# Patient Record
Sex: Female | Born: 1950 | Race: Black or African American | Hispanic: No | Marital: Single | Smoking: Former smoker
Health system: Southern US, Community
[De-identification: ages and names within clinical notes are randomized; demographics above are authoritative.]

## PROBLEM LIST (undated history)

## (undated) DIAGNOSIS — M109 Gout, unspecified: Secondary | ICD-10-CM

## (undated) DIAGNOSIS — G4730 Sleep apnea, unspecified: Secondary | ICD-10-CM

## (undated) DIAGNOSIS — A419 Sepsis, unspecified organism: Secondary | ICD-10-CM

## (undated) DIAGNOSIS — K219 Gastro-esophageal reflux disease without esophagitis: Secondary | ICD-10-CM

## (undated) DIAGNOSIS — E119 Type 2 diabetes mellitus without complications: Secondary | ICD-10-CM

## (undated) DIAGNOSIS — E785 Hyperlipidemia, unspecified: Secondary | ICD-10-CM

## (undated) DIAGNOSIS — N390 Urinary tract infection, site not specified: Secondary | ICD-10-CM

## (undated) DIAGNOSIS — I10 Essential (primary) hypertension: Secondary | ICD-10-CM

## (undated) DIAGNOSIS — I2510 Atherosclerotic heart disease of native coronary artery without angina pectoris: Secondary | ICD-10-CM

## (undated) HISTORY — PX: CARDIAC CATHETERIZATION: SHX172

## (undated) HISTORY — PX: REPLACEMENT TOTAL KNEE: SUR1224

## (undated) HISTORY — PX: CORONARY STENT PLACEMENT: SHX1402

## (undated) HISTORY — PX: CORONARY ANGIOPLASTY: SHX604

---

## 2017-12-28 ENCOUNTER — Encounter (HOSPITAL_COMMUNITY): Payer: Self-pay | Admitting: Oncology

## 2017-12-28 ENCOUNTER — Emergency Department (HOSPITAL_COMMUNITY)
Admission: EM | Admit: 2017-12-28 | Discharge: 2017-12-29 | Disposition: A | Discharge status: Home / Self Care | Payer: Medicare Other | Attending: Emergency Medicine | Admitting: Emergency Medicine

## 2017-12-28 DIAGNOSIS — I10 Essential (primary) hypertension: Secondary | ICD-10-CM | POA: Insufficient documentation

## 2017-12-28 DIAGNOSIS — N390 Urinary tract infection, site not specified: Secondary | ICD-10-CM | POA: Insufficient documentation

## 2017-12-28 DIAGNOSIS — R531 Weakness: Secondary | ICD-10-CM | POA: Diagnosis present

## 2017-12-28 DIAGNOSIS — Z955 Presence of coronary angioplasty implant and graft: Secondary | ICD-10-CM | POA: Insufficient documentation

## 2017-12-28 DIAGNOSIS — Z87891 Personal history of nicotine dependence: Secondary | ICD-10-CM | POA: Diagnosis not present

## 2017-12-28 DIAGNOSIS — E119 Type 2 diabetes mellitus without complications: Secondary | ICD-10-CM | POA: Insufficient documentation

## 2017-12-28 DIAGNOSIS — I2510 Atherosclerotic heart disease of native coronary artery without angina pectoris: Secondary | ICD-10-CM | POA: Diagnosis not present

## 2017-12-28 HISTORY — DX: Gout, unspecified: M10.9

## 2017-12-28 HISTORY — DX: Hyperlipidemia, unspecified: E78.5

## 2017-12-28 HISTORY — DX: Essential (primary) hypertension: I10

## 2017-12-28 HISTORY — DX: Gastro-esophageal reflux disease without esophagitis: K21.9

## 2017-12-28 HISTORY — DX: Type 2 diabetes mellitus without complications: E11.9

## 2017-12-28 HISTORY — DX: Atherosclerotic heart disease of native coronary artery without angina pectoris: I25.10

## 2017-12-28 LAB — COMPREHENSIVE METABOLIC PANEL
ALBUMIN: 3 g/dL — AB (ref 3.5–5.0)
ALT: 10 U/L (ref 0–44)
ANION GAP: 7 (ref 5–15)
AST: 16 U/L (ref 15–41)
Alkaline Phosphatase: 71 U/L (ref 38–126)
BUN: 7 mg/dL — AB (ref 8–23)
CHLORIDE: 103 mmol/L (ref 98–111)
CO2: 28 mmol/L (ref 22–32)
Calcium: 9.2 mg/dL (ref 8.9–10.3)
Creatinine, Ser: 0.91 mg/dL (ref 0.44–1.00)
GFR calc Af Amer: >60 mL/min (ref >60)
GFR calc non Af Amer: >60 mL/min (ref >60)
GLUCOSE: 153 mg/dL — AB (ref 70–99)
POTASSIUM: 3.5 mmol/L (ref 3.5–5.1)
SODIUM: 138 mmol/L (ref 135–145)
Total Bilirubin: 0.6 mg/dL (ref 0.3–1.2)
Total Protein: 6.3 g/dL — ABNORMAL LOW (ref 6.5–8.1)

## 2017-12-28 LAB — URINALYSIS, ROUTINE W REFLEX MICROSCOPIC
BILIRUBIN URINE: NEGATIVE
GLUCOSE, UA: NEGATIVE mg/dL
KETONES UR: NEGATIVE mg/dL
NITRITE: NEGATIVE
PH: 6 (ref 5.0–8.0)
Protein, ur: NEGATIVE mg/dL
Specific Gravity, Urine: 1.009 (ref 1.005–1.030)

## 2017-12-28 LAB — CBC WITH DIFFERENTIAL/PLATELET
Abs Immature Granulocytes: 0 10*3/uL (ref 0.0–0.1)
BASOS ABS: 0.1 10*3/uL (ref 0.0–0.1)
Basophils Relative: 1 %
EOS ABS: 0.1 10*3/uL (ref 0.0–0.7)
EOS PCT: 2 %
HEMATOCRIT: 41.7 % (ref 36.0–46.0)
Hemoglobin: 13 g/dL (ref 12.0–15.0)
IMMATURE GRANULOCYTES: 1 %
LYMPHS ABS: 2.3 10*3/uL (ref 0.7–4.0)
LYMPHS PCT: 40 %
MCH: 26.9 pg (ref 26.0–34.0)
MCHC: 31.2 g/dL (ref 30.0–36.0)
MCV: 86.3 fL (ref 78.0–100.0)
Monocytes Absolute: 0.4 10*3/uL (ref 0.1–1.0)
Monocytes Relative: 7 %
NEUTROS PCT: 49 %
Neutro Abs: 2.9 10*3/uL (ref 1.7–7.7)
Platelets: 176 10*3/uL (ref 150–400)
RBC: 4.83 MIL/uL (ref 3.87–5.11)
RDW: 13.2 % (ref 11.5–15.5)
WBC: 5.8 10*3/uL (ref 4.0–10.5)

## 2017-12-28 LAB — WET PREP, GENITAL
Clue Cells Wet Prep HPF POC: NONE SEEN
Sperm: NONE SEEN
Trich, Wet Prep: NONE SEEN
Yeast Wet Prep HPF POC: NONE SEEN

## 2017-12-28 LAB — LIPASE, BLOOD: Lipase: 27 U/L (ref 11–51)

## 2017-12-28 LAB — I-STAT CG4 LACTIC ACID, ED: LACTIC ACID, VENOUS: 1.41 mmol/L (ref 0.5–1.9)

## 2017-12-28 MED ORDER — CEPHALEXIN 500 MG PO CAPS: 500.0000 mg | ORAL_CAPSULE | Freq: Three times a day (TID) | ORAL | 0 refills | Status: DC

## 2017-12-28 MED ORDER — SODIUM CHLORIDE 0.9 % IV BOLUS
1000.0000 mL | Freq: Once | INTRAVENOUS | Status: AC
  Administered 2017-12-28: 1000 mL via INTRAVENOUS

## 2017-12-28 MED ORDER — SODIUM CHLORIDE 0.9 % IV SOLN
1.0000 g | Freq: Once | INTRAVENOUS | Status: AC
  Administered 2017-12-28: 1 g via INTRAVENOUS
  Filled 2017-12-28: qty 10

## 2017-12-28 NOTE — ED Provider Notes (Signed)
[facility] EMERGENCY DEPARTMENT Provider Note   CSN: [id number] Arrival date & time: 12/28/17  1843     History   Chief Complaint Chief Complaint  Patient presents with  . Weakness    HPI Alyssa Haley is a 67 y.o. female.  HPI Alyssa Haley is a 67 y.o. female history of hypertension, diabetes, coronary disease, presents to emergency department with complaint of generalized weakness, dysuria, vaginal discharge.  Daughter is at bedside, she is a carrier take her for the patient, states in the last 4 days patient has been weaker than usual.  She is unable to get out of bed and walk around the house like she normally does.  She has been sitting in her recliner without getting up.  When she helped to get her up to clean her, patient had a large amount of white vaginal discharge.  Patient has also complained of abdominal pain and dysuria.  She went to her primary care doctor 3 days ago, unable to provide a urine sample there, but started on nitrofurantoin.  Patient has been taking not but her symptoms are not improving.  Daughter states that she feels like patient had fevers, however temperature was not checked.  Patient has not been eating or drinking as much.  She is not having regular bowel movements.  She seems more down than usual.  There has not been any cough or congestion.  No vomiting.  No diarrhea.  Past Medical History:  Diagnosis Date  . Coronary artery disease   . Diabetes mellitus without complication (HCC)   . GERD (gastroesophageal reflux disease)   . Gout   . Hyperlipidemia   . Hypertension     There are no active problems to display for this patient.   Past Surgical History:  Procedure Laterality Date  . CORONARY STENT PLACEMENT       OB History   None      Home Medications    Prior to Admission medications   Not on File    Family History No family history on file.  Social History Social History   Tobacco Use  . Smoking  status: Former Games developer  . Smokeless tobacco: Never Used  Substance Use Topics  . Alcohol use: Not Currently  . Drug use: Never     Allergies   Patient has no known allergies.   Review of Systems Review of Systems  Constitutional: Positive for activity change, appetite change and fatigue. Negative for chills and fever.  Respiratory: Negative for cough, chest tightness and shortness of breath.   Cardiovascular: Negative for chest pain, palpitations and leg swelling.  Gastrointestinal: Positive for abdominal pain and nausea. Negative for diarrhea and vomiting.  Genitourinary: Positive for dysuria and vaginal discharge. Negative for flank pain, pelvic pain, vaginal bleeding and vaginal pain.  Musculoskeletal: Negative for arthralgias, myalgias, neck pain and neck stiffness.  Skin: Negative for rash.  Neurological: Positive for weakness. Negative for dizziness and headaches.  All other systems reviewed and are negative.    Physical Exam Updated Vital Signs BP 113/68 (BP Location: Right Arm)   Pulse 62   Temp 98.4 F (36.9 C) (Oral)   Resp 14   Ht 5\' 8"  (1.727 m)   Wt 99.8 kg   SpO2 96%   BMI 33.45 kg/m   Physical Exam  Constitutional: She appears well-developed and well-nourished. No distress.  HENT:  Head: Normocephalic.  Eyes: Conjunctivae are normal.  Neck: Neck supple.  Cardiovascular: Normal rate, regular rhythm  and normal heart sounds.  Pulmonary/Chest: Effort normal and breath sounds normal. No respiratory distress. She has no wheezes. She has no rales.  Abdominal: Soft. Bowel sounds are normal. She exhibits no distension. There is tenderness. There is no rebound.  Diffuse tenderness to palpation  Genitourinary:  Genitourinary Comments: Normal external genitalia, normal vaginal canal with white discharge.  Normal cervix.  No cervical motion tenderness, uterine or adnexal tenderness or masses.  Musculoskeletal: She exhibits no edema.  Neurological: She is alert.   Skin: Skin is warm and dry.  Psychiatric: She has a normal mood and affect. Her behavior is normal.  Nursing note and vitals reviewed.    ED Treatments / Results  Labs (all labs ordered are listed, but only abnormal results are displayed) Labs Reviewed  URINE CULTURE  WET PREP, GENITAL  CBC WITH DIFFERENTIAL/PLATELET  COMPREHENSIVE METABOLIC PANEL  LIPASE, BLOOD  URINALYSIS, ROUTINE W REFLEX MICROSCOPIC  I-STAT CG4 LACTIC ACID, ED  GC/CHLAMYDIA PROBE AMP ([place]) NOT AT [facility]    EKG None  Radiology No results found.  Procedures Procedures (including critical care time)  Medications Ordered in ED Medications - No data to display   Initial Impression / Assessment and Plan / ED Course  I have reviewed the triage vital signs and the nursing notes.  Pertinent labs & imaging results that were available during my care of the patient were reviewed by me and considered in my medical decision making (see chart for details).     With dysuria and generalized weakness.  Vital signs are normal.  Abdomen is diffusely tender.  Will start with blood work, pelvic exam given vaginal discharge, urine analysis.  Will give IV fluids.  10:05 PM Vital signs continued to be normal.  Labs are all normal.  Normal white blood cell count.  Normal lactic acid.  Urine analysis showing infection, cultures sent.  Rocephin 1 g given IV.  I will switch patient to Keflex.  Discussed with daughter, although patient is generally weak and having difficulty ambulating, we hydrated her, she is eating and drinking with no problems, she stated that she will try to get up more often and move around.  I think at this time with normal vital signs and unremarkable labs with no signs of dehydration, it is reasonable for discharge home.  I will start her on Keflex.  Reassess her abdomen, it is soft.  She is eating and drinking with no pain.  I will have a follow-up with family doctor for close recheck.  Return  precautions discussed.  Vitals:   12/28/17 2015 12/28/17 2030 12/28/17 2045 12/28/17 2100  BP: 112/75 109/74 112/75 127/81  Pulse: (!) 57 (!) 58 (!) 58 64  Resp: 12 15 12 16   Temp:      TempSrc:      SpO2: 100% 99% 100% 100%  Weight:      Height:         Final Clinical Impressions(s) / ED Diagnoses   Final diagnoses:  Lower urinary tract infectious disease    ED Discharge Orders         Ordered    cephALEXin (KEFLEX) 500 MG capsule  3 times daily     12/28/17 2207           Jaynie CrumbleKirichenko, Kaisyn Millea, PA-C 12/29/17 0033    Raeford RazorKohut, Stephen, MD 01/02/18 1440

## 2017-12-28 NOTE — ED Triage Notes (Signed)
Per [facility] pt coming from home where she lives with her daughter. Reports patient has been having failure to thrive, weakness past few days. Pt usually walks with a walker but was unable to stand at all today. Daughter adds pt has had elevated blood sugars.

## 2017-12-28 NOTE — Discharge Instructions (Addendum)
Take antibiotics as prescribed until all gone for the infection.  If not improving or worsening the next 2 days, please return to the hospital follow-up with your doctor

## 2017-12-29 LAB — URINE CULTURE: Culture: <10000 — AB

## 2017-12-30 LAB — GC/CHLAMYDIA PROBE AMP (~~LOC~~) NOT AT ARMC
CHLAMYDIA, DNA PROBE: NEGATIVE
Neisseria Gonorrhea: NEGATIVE

## 2018-05-07 DIAGNOSIS — N390 Urinary tract infection, site not specified: Secondary | ICD-10-CM

## 2018-05-07 HISTORY — DX: Urinary tract infection, site not specified: N39.0

## 2018-05-27 ENCOUNTER — Inpatient Hospital Stay (HOSPITAL_COMMUNITY)
Admission: EM | Admit: 2018-05-27 | Discharge: 2018-06-02 | DRG: 872 | Disposition: A | Discharge status: Skilled Nursing Facility | Payer: Medicare Other | Attending: Internal Medicine | Admitting: Internal Medicine

## 2018-05-27 ENCOUNTER — Other Ambulatory Visit: Payer: Self-pay

## 2018-05-27 DIAGNOSIS — E785 Hyperlipidemia, unspecified: Secondary | ICD-10-CM | POA: Diagnosis present

## 2018-05-27 DIAGNOSIS — Z87891 Personal history of nicotine dependence: Secondary | ICD-10-CM

## 2018-05-27 DIAGNOSIS — A419 Sepsis, unspecified organism: Secondary | ICD-10-CM | POA: Diagnosis not present

## 2018-05-27 DIAGNOSIS — I82412 Acute embolism and thrombosis of left femoral vein: Secondary | ICD-10-CM | POA: Diagnosis present

## 2018-05-27 DIAGNOSIS — K219 Gastro-esophageal reflux disease without esophagitis: Secondary | ICD-10-CM | POA: Diagnosis present

## 2018-05-27 DIAGNOSIS — R531 Weakness: Secondary | ICD-10-CM | POA: Diagnosis not present

## 2018-05-27 DIAGNOSIS — E11649 Type 2 diabetes mellitus with hypoglycemia without coma: Secondary | ICD-10-CM | POA: Diagnosis not present

## 2018-05-27 DIAGNOSIS — F0390 Unspecified dementia without behavioral disturbance: Secondary | ICD-10-CM | POA: Diagnosis present

## 2018-05-27 DIAGNOSIS — M25462 Effusion, left knee: Secondary | ICD-10-CM | POA: Diagnosis present

## 2018-05-27 DIAGNOSIS — M109 Gout, unspecified: Secondary | ICD-10-CM | POA: Diagnosis present

## 2018-05-27 DIAGNOSIS — Z96653 Presence of artificial knee joint, bilateral: Secondary | ICD-10-CM | POA: Diagnosis present

## 2018-05-27 DIAGNOSIS — Y92002 Bathroom of unspecified non-institutional (private) residence single-family (private) house as the place of occurrence of the external cause: Secondary | ICD-10-CM

## 2018-05-27 DIAGNOSIS — N390 Urinary tract infection, site not specified: Secondary | ICD-10-CM | POA: Diagnosis present

## 2018-05-27 DIAGNOSIS — Z955 Presence of coronary angioplasty implant and graft: Secondary | ICD-10-CM

## 2018-05-27 DIAGNOSIS — I10 Essential (primary) hypertension: Secondary | ICD-10-CM | POA: Diagnosis present

## 2018-05-27 DIAGNOSIS — I2510 Atherosclerotic heart disease of native coronary artery without angina pectoris: Secondary | ICD-10-CM | POA: Diagnosis present

## 2018-05-27 DIAGNOSIS — E876 Hypokalemia: Secondary | ICD-10-CM | POA: Diagnosis present

## 2018-05-27 DIAGNOSIS — N179 Acute kidney failure, unspecified: Secondary | ICD-10-CM | POA: Diagnosis present

## 2018-05-27 DIAGNOSIS — Z880 Allergy status to penicillin: Secondary | ICD-10-CM

## 2018-05-27 DIAGNOSIS — Z794 Long term (current) use of insulin: Secondary | ICD-10-CM

## 2018-05-27 DIAGNOSIS — R52 Pain, unspecified: Secondary | ICD-10-CM

## 2018-05-27 DIAGNOSIS — E1165 Type 2 diabetes mellitus with hyperglycemia: Secondary | ICD-10-CM | POA: Diagnosis present

## 2018-05-27 DIAGNOSIS — E860 Dehydration: Secondary | ICD-10-CM | POA: Diagnosis present

## 2018-05-27 DIAGNOSIS — Z79899 Other long term (current) drug therapy: Secondary | ICD-10-CM

## 2018-05-27 DIAGNOSIS — R739 Hyperglycemia, unspecified: Secondary | ICD-10-CM

## 2018-05-27 DIAGNOSIS — M25559 Pain in unspecified hip: Secondary | ICD-10-CM

## 2018-05-27 DIAGNOSIS — W1839XA Other fall on same level, initial encounter: Secondary | ICD-10-CM | POA: Diagnosis present

## 2018-05-27 HISTORY — DX: Urinary tract infection, site not specified: N39.0

## 2018-05-27 HISTORY — DX: Sepsis, unspecified organism: A41.9

## 2018-05-27 LAB — CBG MONITORING, ED: Glucose-Capillary: 561 mg/dL (ref 70–99)

## 2018-05-27 MED ORDER — INSULIN REGULAR(HUMAN) IN NACL 100-0.9 UT/100ML-% IV SOLN
INTRAVENOUS | Status: DC
  Administered 2018-05-28: 5 [IU]/h via INTRAVENOUS
  Filled 2018-05-27: qty 100

## 2018-05-27 MED ORDER — SODIUM CHLORIDE 0.9 % IV BOLUS
1000.0000 mL | Freq: Once | INTRAVENOUS | Status: AC
  Administered 2018-05-28: 1000 mL via INTRAVENOUS

## 2018-05-27 MED ORDER — SODIUM CHLORIDE 0.9 % IV SOLN: INTRAVENOUS | Status: DC

## 2018-05-27 NOTE — ED Triage Notes (Signed)
Family was attempting to help patient to bathroom when she collapsed to the floor; no loss of consciousness. Patient is a diabetic and family states that she checks her own blood sugar and generally takes care of herself.

## 2018-05-28 ENCOUNTER — Inpatient Hospital Stay (HOSPITAL_COMMUNITY): Payer: Medicare Other

## 2018-05-28 ENCOUNTER — Encounter (HOSPITAL_COMMUNITY): Payer: Self-pay | Admitting: Internal Medicine

## 2018-05-28 ENCOUNTER — Emergency Department (HOSPITAL_COMMUNITY): Payer: Medicare Other

## 2018-05-28 DIAGNOSIS — F0390 Unspecified dementia without behavioral disturbance: Secondary | ICD-10-CM | POA: Diagnosis present

## 2018-05-28 DIAGNOSIS — I2510 Atherosclerotic heart disease of native coronary artery without angina pectoris: Secondary | ICD-10-CM | POA: Diagnosis present

## 2018-05-28 DIAGNOSIS — E785 Hyperlipidemia, unspecified: Secondary | ICD-10-CM | POA: Diagnosis present

## 2018-05-28 DIAGNOSIS — I82402 Acute embolism and thrombosis of unspecified deep veins of left lower extremity: Secondary | ICD-10-CM | POA: Diagnosis not present

## 2018-05-28 DIAGNOSIS — Z79899 Other long term (current) drug therapy: Secondary | ICD-10-CM | POA: Diagnosis not present

## 2018-05-28 DIAGNOSIS — I10 Essential (primary) hypertension: Secondary | ICD-10-CM | POA: Diagnosis present

## 2018-05-28 DIAGNOSIS — R609 Edema, unspecified: Secondary | ICD-10-CM

## 2018-05-28 DIAGNOSIS — N390 Urinary tract infection, site not specified: Secondary | ICD-10-CM | POA: Diagnosis present

## 2018-05-28 DIAGNOSIS — I82409 Acute embolism and thrombosis of unspecified deep veins of unspecified lower extremity: Secondary | ICD-10-CM | POA: Diagnosis not present

## 2018-05-28 DIAGNOSIS — E11649 Type 2 diabetes mellitus with hypoglycemia without coma: Secondary | ICD-10-CM | POA: Diagnosis not present

## 2018-05-28 DIAGNOSIS — E860 Dehydration: Secondary | ICD-10-CM | POA: Diagnosis present

## 2018-05-28 DIAGNOSIS — Y92002 Bathroom of unspecified non-institutional (private) residence single-family (private) house as the place of occurrence of the external cause: Secondary | ICD-10-CM | POA: Diagnosis not present

## 2018-05-28 DIAGNOSIS — I82412 Acute embolism and thrombosis of left femoral vein: Secondary | ICD-10-CM | POA: Diagnosis present

## 2018-05-28 DIAGNOSIS — K219 Gastro-esophageal reflux disease without esophagitis: Secondary | ICD-10-CM | POA: Diagnosis present

## 2018-05-28 DIAGNOSIS — M109 Gout, unspecified: Secondary | ICD-10-CM | POA: Diagnosis present

## 2018-05-28 DIAGNOSIS — Z880 Allergy status to penicillin: Secondary | ICD-10-CM | POA: Diagnosis not present

## 2018-05-28 DIAGNOSIS — Z87891 Personal history of nicotine dependence: Secondary | ICD-10-CM | POA: Diagnosis not present

## 2018-05-28 DIAGNOSIS — N179 Acute kidney failure, unspecified: Secondary | ICD-10-CM | POA: Diagnosis present

## 2018-05-28 DIAGNOSIS — A419 Sepsis, unspecified organism: Secondary | ICD-10-CM

## 2018-05-28 DIAGNOSIS — R531 Weakness: Secondary | ICD-10-CM | POA: Diagnosis present

## 2018-05-28 DIAGNOSIS — E1165 Type 2 diabetes mellitus with hyperglycemia: Secondary | ICD-10-CM | POA: Diagnosis present

## 2018-05-28 DIAGNOSIS — W1839XA Other fall on same level, initial encounter: Secondary | ICD-10-CM | POA: Diagnosis present

## 2018-05-28 DIAGNOSIS — Z794 Long term (current) use of insulin: Secondary | ICD-10-CM | POA: Diagnosis not present

## 2018-05-28 DIAGNOSIS — Z96653 Presence of artificial knee joint, bilateral: Secondary | ICD-10-CM | POA: Diagnosis present

## 2018-05-28 DIAGNOSIS — E876 Hypokalemia: Secondary | ICD-10-CM | POA: Diagnosis present

## 2018-05-28 DIAGNOSIS — Z955 Presence of coronary angioplasty implant and graft: Secondary | ICD-10-CM | POA: Diagnosis not present

## 2018-05-28 DIAGNOSIS — M25462 Effusion, left knee: Secondary | ICD-10-CM | POA: Diagnosis present

## 2018-05-28 HISTORY — DX: Sepsis, unspecified organism: A41.9

## 2018-05-28 LAB — CBG MONITORING, ED
GLUCOSE-CAPILLARY: 336 mg/dL — AB (ref 70–99)
Glucose-Capillary: 443 mg/dL — ABNORMAL HIGH (ref 70–99)
Glucose-Capillary: 255 mg/dL — ABNORMAL HIGH (ref 70–99)
Glucose-Capillary: 152 mg/dL — ABNORMAL HIGH (ref 70–99)
Glucose-Capillary: 96 mg/dL (ref 70–99)
Glucose-Capillary: 100 mg/dL — ABNORMAL HIGH (ref 70–99)
Glucose-Capillary: 160 mg/dL — ABNORMAL HIGH (ref 70–99)
Glucose-Capillary: 187 mg/dL — ABNORMAL HIGH (ref 70–99)

## 2018-05-28 LAB — CBC WITH DIFFERENTIAL/PLATELET
Abs Immature Granulocytes: 0.27 10*3/uL — ABNORMAL HIGH (ref 0.00–0.07)
Abs Immature Granulocytes: 0.06 10*3/uL (ref 0.00–0.07)
Basophils Absolute: 0 10*3/uL (ref 0.0–0.1)
Basophils Absolute: 0 10*3/uL (ref 0.0–0.1)
Basophils Relative: 0 %
Basophils Relative: 0 %
EOS PCT: 0 %
Eosinophils Absolute: 0 10*3/uL (ref 0.0–0.5)
Eosinophils Absolute: 0 10*3/uL (ref 0.0–0.5)
Eosinophils Relative: 0 %
HCT: 46.9 % — ABNORMAL HIGH (ref 36.0–46.0)
HCT: 42.4 % (ref 36.0–46.0)
Hemoglobin: 14.8 g/dL (ref 12.0–15.0)
Hemoglobin: 13 g/dL (ref 12.0–15.0)
Immature Granulocytes: 2 %
Immature Granulocytes: 0 %
LYMPHS ABS: 2.8 10*3/uL (ref 0.7–4.0)
LYMPHS PCT: 7 %
Lymphocytes Relative: 20 %
Lymphs Abs: 1.2 10*3/uL (ref 0.7–4.0)
MCH: 26.5 pg (ref 26.0–34.0)
MCH: 26 pg (ref 26.0–34.0)
MCHC: 31.6 g/dL (ref 30.0–36.0)
MCHC: 30.7 g/dL (ref 30.0–36.0)
MCV: 83.9 fL (ref 80.0–100.0)
MCV: 84.8 fL (ref 80.0–100.0)
MONOS PCT: 6 %
Monocytes Absolute: 0.7 10*3/uL (ref 0.1–1.0)
Monocytes Absolute: 0.9 10*3/uL (ref 0.1–1.0)
Monocytes Relative: 4 %
Neutro Abs: 15.7 10*3/uL — ABNORMAL HIGH (ref 1.7–7.7)
Neutro Abs: 10.4 10*3/uL — ABNORMAL HIGH (ref 1.7–7.7)
Neutrophils Relative %: 87 %
Neutrophils Relative %: 74 %
Platelets: 318 10*3/uL (ref 150–400)
Platelets: 265 10*3/uL (ref 150–400)
RBC: 5.59 MIL/uL — ABNORMAL HIGH (ref 3.87–5.11)
RBC: 5 MIL/uL (ref 3.87–5.11)
RDW: 14.4 % (ref 11.5–15.5)
RDW: 14.6 % (ref 11.5–15.5)
WBC: 17.9 10*3/uL — ABNORMAL HIGH (ref 4.0–10.5)
WBC: 14.2 10*3/uL — ABNORMAL HIGH (ref 4.0–10.5)
nRBC: 0 % (ref 0.0–0.2)
nRBC: 0 % (ref 0.0–0.2)

## 2018-05-28 LAB — COMPREHENSIVE METABOLIC PANEL
ALK PHOS: 96 U/L (ref 38–126)
ALT: 18 U/L (ref 0–44)
ALT: 13 U/L (ref 0–44)
AST: 25 U/L (ref 15–41)
AST: 20 U/L (ref 15–41)
Albumin: 3.3 g/dL — ABNORMAL LOW (ref 3.5–5.0)
Albumin: 2.9 g/dL — ABNORMAL LOW (ref 3.5–5.0)
Alkaline Phosphatase: 83 U/L (ref 38–126)
Anion gap: 17 — ABNORMAL HIGH (ref 5–15)
Anion gap: 14 (ref 5–15)
BILIRUBIN TOTAL: 0.9 mg/dL (ref 0.3–1.2)
BUN: 19 mg/dL (ref 8–23)
BUN: 16 mg/dL (ref 8–23)
CO2: 22 mmol/L (ref 22–32)
CO2: 20 mmol/L — ABNORMAL LOW (ref 22–32)
Calcium: 9.6 mg/dL (ref 8.9–10.3)
Calcium: 9.1 mg/dL (ref 8.9–10.3)
Chloride: 96 mmol/L — ABNORMAL LOW (ref 98–111)
Chloride: 106 mmol/L (ref 98–111)
Creatinine, Ser: 1.46 mg/dL — ABNORMAL HIGH (ref 0.44–1.00)
Creatinine, Ser: 0.94 mg/dL (ref 0.44–1.00)
GFR calc Af Amer: 43 mL/min — ABNORMAL LOW (ref >60)
GFR calc Af Amer: >60 mL/min (ref >60)
GFR calc non Af Amer: 37 mL/min — ABNORMAL LOW (ref >60)
GFR calc non Af Amer: >60 mL/min (ref >60)
Glucose, Bld: 557 mg/dL (ref 70–99)
Glucose, Bld: 105 mg/dL — ABNORMAL HIGH (ref 70–99)
Potassium: 3.6 mmol/L (ref 3.5–5.1)
Potassium: 3.6 mmol/L (ref 3.5–5.1)
Sodium: 135 mmol/L (ref 135–145)
Sodium: 140 mmol/L (ref 135–145)
Total Bilirubin: 0.4 mg/dL (ref 0.3–1.2)
Total Protein: 7.9 g/dL (ref 6.5–8.1)
Total Protein: 6.4 g/dL — ABNORMAL LOW (ref 6.5–8.1)

## 2018-05-28 LAB — HIV ANTIBODY (ROUTINE TESTING W REFLEX): HIV Screen 4th Generation wRfx: NONREACTIVE

## 2018-05-28 LAB — URINALYSIS, ROUTINE W REFLEX MICROSCOPIC
Bilirubin Urine: NEGATIVE
Glucose, UA: >=500 mg/dL — AB
Ketones, ur: NEGATIVE mg/dL
Nitrite: NEGATIVE
Protein, ur: 30 mg/dL — AB
Specific Gravity, Urine: 1.027 (ref 1.005–1.030)
pH: 5 (ref 5.0–8.0)

## 2018-05-28 LAB — TROPONIN I: Troponin I: <0.03 ng/mL (ref <0.03)

## 2018-05-28 LAB — POCT I-STAT EG7
Acid-Base Excess: 1 mmol/L (ref 0.0–2.0)
BICARBONATE: 27.8 mmol/L (ref 20.0–28.0)
Calcium, Ion: 1.24 mmol/L (ref 1.15–1.40)
HCT: 47 % — ABNORMAL HIGH (ref 36.0–46.0)
Hemoglobin: 16 g/dL — ABNORMAL HIGH (ref 12.0–15.0)
O2 Saturation: 39 %
PO2 VEN: 24 mmHg — AB (ref 32.0–45.0)
Potassium: 3.1 mmol/L — ABNORMAL LOW (ref 3.5–5.1)
Sodium: 140 mmol/L (ref 135–145)
TCO2: 29 mmol/L (ref 22–32)
pCO2, Ven: 51.1 mmHg (ref 44.0–60.0)
pH, Ven: 7.344 (ref 7.250–7.430)

## 2018-05-28 LAB — LACTIC ACID, PLASMA
Lactic Acid, Venous: 5.6 mmol/L (ref 0.5–1.9)
Lactic Acid, Venous: 5.7 mmol/L (ref 0.5–1.9)
Lactic Acid, Venous: 2.7 mmol/L (ref 0.5–1.9)

## 2018-05-28 LAB — HEMOGLOBIN A1C
HEMOGLOBIN A1C: 11.1 % — AB (ref 4.8–5.6)
Mean Plasma Glucose: 271.87 mg/dL

## 2018-05-28 LAB — I-STAT TROPONIN, ED: Troponin i, poc: 0 ng/mL (ref 0.00–0.08)

## 2018-05-28 LAB — GLUCOSE, CAPILLARY
GLUCOSE-CAPILLARY: 155 mg/dL — AB (ref 70–99)
Glucose-Capillary: 165 mg/dL — ABNORMAL HIGH (ref 70–99)
Glucose-Capillary: 162 mg/dL — ABNORMAL HIGH (ref 70–99)

## 2018-05-28 LAB — APTT: aPTT: 28 seconds (ref 24–36)

## 2018-05-28 LAB — MAGNESIUM: Magnesium: 1.4 mg/dL — ABNORMAL LOW (ref 1.7–2.4)

## 2018-05-28 LAB — URIC ACID: Uric Acid, Serum: 5 mg/dL (ref 2.5–7.1)

## 2018-05-28 LAB — PROCALCITONIN: Procalcitonin: 0.85 ng/mL

## 2018-05-28 LAB — PROTIME-INR
INR: 1.05
Prothrombin Time: 13.6 seconds (ref 11.4–15.2)

## 2018-05-28 LAB — TSH: TSH: 0.526 u[IU]/mL (ref 0.350–4.500)

## 2018-05-28 MED ORDER — MAGNESIUM SULFATE 4 GM/100ML IV SOLN
4.0000 g | Freq: Once | INTRAVENOUS | Status: AC
  Administered 2018-05-28: 4 g via INTRAVENOUS
  Filled 2018-05-28: qty 100

## 2018-05-28 MED ORDER — AMLODIPINE BESYLATE 5 MG PO TABS: 10.0000 mg | ORAL_TABLET | Freq: Every day | ORAL | Status: DC

## 2018-05-28 MED ORDER — INSULIN GLARGINE 100 UNIT/ML ~~LOC~~ SOLN
60.0000 [IU] | Freq: Every day | SUBCUTANEOUS | Status: DC
  Administered 2018-05-28 – 2018-05-29 (×2): 60 [IU] via SUBCUTANEOUS
  Filled 2018-05-28 (×3): qty 0.6

## 2018-05-28 MED ORDER — INSULIN REGULAR(HUMAN) IN NACL 100-0.9 UT/100ML-% IV SOLN: INTRAVENOUS | Status: DC

## 2018-05-28 MED ORDER — ATORVASTATIN CALCIUM 80 MG PO TABS
80.0000 mg | ORAL_TABLET | Freq: Every day | ORAL | Status: DC
  Administered 2018-05-28 – 2018-06-01 (×5): 80 mg via ORAL
  Filled 2018-05-28 (×5): qty 1

## 2018-05-28 MED ORDER — ONDANSETRON HCL 4 MG PO TABS: 4.0000 mg | ORAL_TABLET | Freq: Four times a day (QID) | ORAL | Status: DC | PRN

## 2018-05-28 MED ORDER — ACETAMINOPHEN 650 MG RE SUPP: 650.0000 mg | Freq: Four times a day (QID) | RECTAL | Status: DC | PRN

## 2018-05-28 MED ORDER — SODIUM CHLORIDE 0.9 % IV SOLN: INTRAVENOUS | Status: DC

## 2018-05-28 MED ORDER — LOSARTAN POTASSIUM 50 MG PO TABS: 25.0000 mg | ORAL_TABLET | Freq: Every day | ORAL | Status: DC

## 2018-05-28 MED ORDER — DEXTROSE 50 % IV SOLN: 25.0000 mL | INTRAVENOUS | Status: DC | PRN

## 2018-05-28 MED ORDER — MIRTAZAPINE 15 MG PO TABS
15.0000 mg | ORAL_TABLET | Freq: Every evening | ORAL | Status: DC | PRN
  Administered 2018-05-29: 15 mg via ORAL
  Filled 2018-05-28 (×2): qty 1

## 2018-05-28 MED ORDER — PANTOPRAZOLE SODIUM 40 MG PO TBEC
40.0000 mg | DELAYED_RELEASE_TABLET | Freq: Every day | ORAL | Status: DC
  Administered 2018-05-28 – 2018-06-02 (×6): 40 mg via ORAL
  Filled 2018-05-28 (×6): qty 1

## 2018-05-28 MED ORDER — SODIUM CHLORIDE 0.9 % IV SOLN
1.0000 g | Freq: Once | INTRAVENOUS | Status: AC
  Administered 2018-05-28: 1 g via INTRAVENOUS
  Filled 2018-05-28: qty 10

## 2018-05-28 MED ORDER — ROSUVASTATIN CALCIUM 20 MG PO TABS: 40.0000 mg | ORAL_TABLET | Freq: Every day | ORAL | Status: DC

## 2018-05-28 MED ORDER — OXYCODONE-ACETAMINOPHEN 5-325 MG PO TABS
1.0000 | ORAL_TABLET | Freq: Four times a day (QID) | ORAL | Status: DC | PRN
  Administered 2018-05-29 – 2018-06-02 (×7): 1 via ORAL
  Filled 2018-05-28 (×8): qty 1

## 2018-05-28 MED ORDER — GABAPENTIN 300 MG PO CAPS: 300.0000 mg | ORAL_CAPSULE | Freq: Three times a day (TID) | ORAL | Status: DC | PRN

## 2018-05-28 MED ORDER — POTASSIUM CHLORIDE 10 MEQ/100ML IV SOLN
10.0000 meq | INTRAVENOUS | Status: AC
  Administered 2018-05-28 (×2): 10 meq via INTRAVENOUS
  Filled 2018-05-28 (×2): qty 100

## 2018-05-28 MED ORDER — ZOLPIDEM TARTRATE 5 MG PO TABS: 5.0000 mg | ORAL_TABLET | Freq: Every evening | ORAL | Status: DC | PRN

## 2018-05-28 MED ORDER — HYDRALAZINE HCL 20 MG/ML IJ SOLN: 10.0000 mg | INTRAMUSCULAR | Status: DC | PRN

## 2018-05-28 MED ORDER — ENSURE ENLIVE PO LIQD
237.0000 mL | Freq: Two times a day (BID) | ORAL | Status: DC
  Administered 2018-05-28 – 2018-05-29 (×2): 237 mL via ORAL

## 2018-05-28 MED ORDER — APIXABAN 5 MG PO TABS
10.0000 mg | ORAL_TABLET | Freq: Two times a day (BID) | ORAL | Status: DC
  Administered 2018-05-28 – 2018-06-02 (×11): 10 mg via ORAL
  Filled 2018-05-28 (×12): qty 2

## 2018-05-28 MED ORDER — SODIUM CHLORIDE 0.9 % IV BOLUS (SEPSIS)
1000.0000 mL | Freq: Once | INTRAVENOUS | Status: AC
  Administered 2018-05-28: 1000 mL via INTRAVENOUS

## 2018-05-28 MED ORDER — APIXABAN 5 MG PO TABS: 5.0000 mg | ORAL_TABLET | Freq: Two times a day (BID) | ORAL | Status: DC

## 2018-05-28 MED ORDER — INSULIN ASPART 100 UNIT/ML ~~LOC~~ SOLN
0.0000 [IU] | Freq: Three times a day (TID) | SUBCUTANEOUS | Status: DC
  Administered 2018-05-28 – 2018-05-30 (×4): 2 [IU] via SUBCUTANEOUS
  Administered 2018-05-30: 1 [IU] via SUBCUTANEOUS
  Administered 2018-05-30: 3 [IU] via SUBCUTANEOUS
  Administered 2018-05-31 – 2018-06-01 (×2): 2 [IU] via SUBCUTANEOUS
  Administered 2018-06-01: 1 [IU] via SUBCUTANEOUS
  Administered 2018-06-02: 3 [IU] via SUBCUTANEOUS

## 2018-05-28 MED ORDER — ACETAMINOPHEN 325 MG PO TABS
650.0000 mg | ORAL_TABLET | Freq: Four times a day (QID) | ORAL | Status: DC | PRN
  Administered 2018-05-29 – 2018-06-02 (×4): 650 mg via ORAL
  Filled 2018-05-28 (×4): qty 2

## 2018-05-28 MED ORDER — ALLOPURINOL 100 MG PO TABS
100.0000 mg | ORAL_TABLET | Freq: Two times a day (BID) | ORAL | Status: DC
  Administered 2018-05-28 – 2018-06-02 (×11): 100 mg via ORAL
  Filled 2018-05-28 (×11): qty 1

## 2018-05-28 MED ORDER — OXYCODONE-ACETAMINOPHEN 10-325 MG PO TABS: 1.0000 | ORAL_TABLET | Freq: Four times a day (QID) | ORAL | Status: DC | PRN

## 2018-05-28 MED ORDER — ONDANSETRON HCL 4 MG/2ML IJ SOLN: 4.0000 mg | Freq: Four times a day (QID) | INTRAMUSCULAR | Status: DC | PRN

## 2018-05-28 MED ORDER — SODIUM CHLORIDE 0.9 % IV SOLN
1.0000 g | INTRAVENOUS | Status: DC
  Administered 2018-05-29 – 2018-06-01 (×4): 1 g via INTRAVENOUS
  Filled 2018-05-28 (×4): qty 10

## 2018-05-28 MED ORDER — SODIUM CHLORIDE 0.9 % IV SOLN
INTRAVENOUS | Status: DC
  Administered 2018-05-28 (×2): via INTRAVENOUS

## 2018-05-28 MED ORDER — INSULIN REGULAR BOLUS VIA INFUSION
0.0000 [IU] | Freq: Three times a day (TID) | INTRAVENOUS | Status: DC
  Filled 2018-05-28: qty 10

## 2018-05-28 MED ORDER — OXYCODONE HCL 5 MG PO TABS
5.0000 mg | ORAL_TABLET | Freq: Four times a day (QID) | ORAL | Status: DC | PRN
  Administered 2018-05-29 – 2018-06-02 (×8): 5 mg via ORAL
  Filled 2018-05-28 (×9): qty 1

## 2018-05-28 MED ORDER — INSULIN ASPART 100 UNIT/ML ~~LOC~~ SOLN: 0.0000 [IU] | Freq: Every day | SUBCUTANEOUS | Status: DC

## 2018-05-28 MED ORDER — METOPROLOL SUCCINATE ER 50 MG PO TB24
50.0000 mg | ORAL_TABLET | Freq: Every day | ORAL | Status: DC
  Administered 2018-05-28 – 2018-06-02 (×6): 50 mg via ORAL
  Filled 2018-05-28 (×5): qty 1
  Filled 2018-05-28: qty 2

## 2018-05-28 NOTE — ED Notes (Signed)
Patient transported to X-ray 

## 2018-05-28 NOTE — Care Management (Addendum)
#    2.  PRIMARY INS : MEDICARE PART  A & B          NO PHARMACY  BENEFITS                   SECONDARY INS :  MEDICAID  OF [place]         EFF- DATE :12-05-2017         CO-PAY- $ 3.90 FOR EACH PRESCRIPTION  ELIQUIS    5 MG BID

## 2018-05-28 NOTE — Progress Notes (Signed)
PROGRESS NOTE   Alyssa Haley  SKA:[id number]    DOB: 01/11/1951    DOA: 05/27/2018  PCP: System, Pcp Not In   I have briefly reviewed patients previous medical records in [facility].  Brief Narrative:  68 year old female with PMH of DM 2/IDDM, HTN, HLD, CAD status post PCI, GERD and gout who presented to [facility] ED on 05/27/2018 due to 3 to 4 days history of generalized weakness and fall at home.  She reported dysuria and several days history of left lower extremity swelling, intermittent bilateral hip and knee pains.  She was admitted for sepsis likely due to UTI, uncontrolled type II DM, acute renal failure and extensive left lower extremity DVT.   Assessment & Plan:   Principal Problem:   Sepsis ([place]) Active Problems:   CAD (coronary artery disease)   Essential hypertension   Uncontrolled type 2 diabetes mellitus with hyperglycemia (HCC)   Gout   Acute lower UTI   Swelling of joint of left knee   Sepsis secondary to UTI ([place])   Sepsis due to UTI: Met sepsis criteria on admission.  Treated per protocol with IV fluids and started empirically on IV ceftriaxone, continue.  Sepsis physiology has resolved.  Uncontrolled type II DM/IDDM with hyperglycemia: Presented with blood sugar of 557 but no DKA.  Briefly on IV insulin, improved glycemic control, discontinued overnight of admission.  Transitioned to Lantus 60 units daily (on Toujeo 70 units daily at home) and added NovoLog SSI.  Monitor closely and adjust insulins as needed.  Check A1c.  Elevated lactate: Secondary to UTI sepsis.  Treated with IV fluids.  Lactate has improved from greater 5.6-2.7.  Continue additional day of IV fluids.  Left lower extremity extensive DVT Patient not very mobile.  No recent long distance travel history.  No history of prior VTE.  Left lower extremity venous Doppler confirms extensive DVT as below.  Discussed extensively with patient/daughter regarding anticoagulation, risks and benefits,  options including warfarin/Lovenox bridge, Eliquis/Xarelto, Pradaxa, risks and benefits of each.  They decided to go along with Eliquis, initiated per pharmacy.  Duration of anticoagulation at least 3 months.  X-rays of both hips and left knee without acute findings.  Acute renal failure: Creatinine was normal in August 2018.  Presented with creatinine of 1.4.  Likely due to dehydration.  Resolved after IV fluid hydration.  Follow BMP periodically.  ARB temporarily held.  Essential hypertension: Had soft blood pressures in the ED, temporarily holding off on ARB and amlodipine.  Continue beta-blockers and PRN IV hydralazine.  CAD status post PCI: Asymptomatic of chest pain.  Troponin x1: Negative.  Continue statins and beta-blockers.  Gout: No acute gouty flare.  Continue allopurinol.  Hyperlipidemia:  Hypokalemia Replaced.  Hypomagnesemia Replace and follow.   DVT prophylaxis: Initiated Eliquis 2/22 Code Status: Full Family Communication: Discussed in detail with patient's daughter at bedside, updated care and answered questions. Disposition: DC home pending clinical improvement.   Consultants:  None  Procedures:  None  Antimicrobials:  IV ceftriaxone   Subjective: Patient resident of [place], [place] where she has her PCP of 40+ years.  Recently lost her son and has been staying with daughter in [place] for the last 2 months.  Ambulates with the help of a walker.  History of bilateral TKR.  Dysuria.  Chills without fevers.  Sustained fall at home.  Left lower extremity diffuse swelling for approximately a week.  Reports pain in both hips and both knees.  ROS: As above,  otherwise negative.  Objective:  Vitals:   05/28/18 1100 05/28/18 1107 05/28/18 1215 05/28/18 1223  BP: 126/62 126/62  (!) 105/59  Pulse: 100 99  93  Resp:  18  17  Temp:    98.2 F (36.8 C)  TempSrc:    Oral  SpO2:  99%  99%  Weight:   89.7 kg   Height:   '5\' 9"'  (1.753 m)      Examination:  General exam: Pleasant middle-aged female, moderately built and overweight lying comfortably propped up in bed. Respiratory system: Clear to auscultation. Respiratory effort normal. Cardiovascular system: S1 & S2 heard, RRR. No JVD, murmurs, rubs, gallops or clicks.  Telemetry personally reviewed: Sinus rhythm. Gastrointestinal system: Abdomen is nondistended, soft and nontender. No organomegaly or masses felt. Normal bowel sounds heard. Central nervous system: Alert and oriented x2. No focal neurological deficits. Extremities: Symmetric 5 x 5 power in upper extremities and at least grade 3 x 5 in lower extremities- unable to assess objectively due to lack of patient cooperation.  Left lower extremity with diffuse swelling from thigh down to the leg, 1+ pitting edema.  Bilateral knees with TKR scars but no acute findings. Skin: No rashes, lesions or ulcers Psychiatry: Judgement and insight appear impaired. Mood & affect appropriate.     Data Reviewed: I have personally reviewed following labs and imaging studies  CBC: Recent Labs  Lab 05/27/18 2305 05/28/18 0205 05/28/18 0807  WBC 17.9*  --  14.2*  NEUTROABS 15.7*  --  10.4*  HGB 14.8 16.0* 13.0  HCT 46.9* 47.0* 42.4  MCV 83.9  --  84.8  PLT 318  --  756   Basic Metabolic Panel: Recent Labs  Lab 05/27/18 2321 05/28/18 0205 05/28/18 0807  NA 135 140 140  K 3.6 3.1* 3.6  CL 96*  --  106  CO2 22  --  20*  GLUCOSE 557*  --  105*  BUN 19  --  16  CREATININE 1.46*  --  0.94  CALCIUM 9.6  --  9.1  MG  --   --  1.4*   Liver Function Tests: Recent Labs  Lab 05/27/18 2321 05/28/18 0807  AST 25 20  ALT 18 13  ALKPHOS 96 83  BILITOT 0.9 0.4  PROT 7.9 6.4*  ALBUMIN 3.3* 2.9*   Coagulation Profile: Recent Labs  Lab 05/28/18 0807  INR 1.05   Cardiac Enzymes: Recent Labs  Lab 05/28/18 0807  TROPONINI <0.03   HbA1C: No results for input(s): HGBA1C in the last 72 hours. CBG: Recent Labs  Lab  05/28/18 0605 05/28/18 0724 05/28/18 0928 05/28/18 1110 05/28/18 1245  GLUCAP 96 100* 160* 187* 165*    No results found for this or any previous visit (from the past 240 hour(s)).       Radiology Studies: Dg Knee 1-2 Views Left  Result Date: 05/28/2018 CLINICAL DATA:  Left knee pain today EXAM: LEFT KNEE - 1-2 VIEW COMPARISON:  None. FINDINGS: Total knee arthroplasty that is well seated. Normal joint alignment. No fracture or erosion. No definite joint effusion. There is nonspecific generalized subcutaneous reticulation IMPRESSION: No acute finding. 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 Electronically Signed   By: Monte Fantasia M.D.   On: 05/28/2018 06:18   Dg Chest Port 1 View  Result Date: 05/28/2018 CLINICAL DATA:  Sepsis EXAM: PORTABLE CHEST 1 VIEW COMPARISON:  None. FINDINGS: Normal heart size. Aortic tortuosity accentuated by rightward rotation. There is no edema, consolidation, effusion, or pneumothorax.  Artifact from EKG leads. Prominent degenerative endplate spurring. IMPRESSION: No acute finding. Electronically Signed   By: Monte Fantasia M.D.   On: 05/28/2018 06:17   Dg Hip Unilat With Pelvis 2-3 Views Left  Result Date: 05/28/2018 CLINICAL DATA:  Left worse than right hip pain. Status post fall today. Initial encounter. EXAM: DG HIP (WITH OR WITHOUT PELVIS) 2-3V LEFT COMPARISON:  Plain films of the right hip 05/28/2018 FINDINGS: No acute bony or joint abnormality is seen. Mild degenerative change is present about the hips. Atherosclerosis noted. IMPRESSION: No acute abnormality. Electronically Signed   By: Inge Rise M.D.   On: 05/28/2018 09:21   Dg Hip Unilat W Or W/o Pelvis 2-3 Views Right  Result Date: 05/28/2018 CLINICAL DATA:  RIGHT hip pain for 2 days.  No injury. EXAM: DG HIP (WITH OR WITHOUT PELVIS) 2-3V RIGHT COMPARISON:  None. FINDINGS: There is no evidence of hip fracture or dislocation. Mild bilateral superolateral acetabular spurring.  No advanced arthropathy or other focal bone abnormality. Mild aortoiliac calcifications. IMPRESSION: No acute osseous process. Aortic Atherosclerosis (ICD10-I70.0). Electronically Signed   By: Elon Alas M.D.   On: 05/28/2018 01:02   Vas [place] Lower Extremity Venous (dvt)  Result Date: 05/28/2018  Lower Venous Study Indications: Edema.  Performing Technologist: Toma Copier RVS  Examination Guidelines: A complete evaluation includes B-mode imaging, spectral Doppler, color Doppler, and power Doppler as needed of all accessible portions of each vessel. Bilateral testing is considered an integral part of a complete examination. Limited examinations for reoccurring indications may be performed as noted.  Right Venous Findings: +---+---------------+---------+-----------+----------+-------+    CompressibilityPhasicitySpontaneityPropertiesSummary +---+---------------+---------+-----------+----------+-------+ CFVFull           Yes      Yes                          +---+---------------+---------+-----------+----------+-------+ SFJFull                                                 +---+---------------+---------+-----------+----------+-------+  Left Venous Findings: +---------+---------------+---------+-----------+----------+-------------------+          CompressibilityPhasicitySpontaneityPropertiesSummary             +---------+---------------+---------+-----------+----------+-------------------+ CFV      None           No       No                                       +---------+---------------+---------+-----------+----------+-------------------+ SFJ      Partial        No       Yes                                      +---------+---------------+---------+-----------+----------+-------------------+ FV Prox  None           No       No                                       +---------+---------------+---------+-----------+----------+-------------------+ FV Mid    None                                                             +---------+---------------+---------+-----------+----------+-------------------+  FV DistalNone           No       No                                       +---------+---------------+---------+-----------+----------+-------------------+ PFV      Full           No       Yes                                      +---------+---------------+---------+-----------+----------+-------------------+ POP      None           No       No                                       +---------+---------------+---------+-----------+----------+-------------------+ PTV      Partial        No       No                                       +---------+---------------+---------+-----------+----------+-------------------+ PERO                                                  Unable to visualize                                                       well enough to                                                            evaluate.           +---------+---------------+---------+-----------+----------+-------------------+ External iliac vein thrombosed. Unable to see IVC due to bowel gas    Summary: Right: There is no evidence of a common femoral vein obstruction Left: Findings consistent with acute deep vein thrombosis involving the left common femoral vein, left femoral vein, left popliteal vein, and left posterior tibial vein. See technician comments listed above  *See table(s) above for measurements and observations. Electronically signed by Curt Jews MD on 05/28/2018 at 2:31:50 PM.    Final         Scheduled Meds: . allopurinol  100 mg Oral BID  . apixaban  10 mg Oral BID   Followed by  . [START ON 06/04/2018] apixaban  5 mg Oral BID  . atorvastatin  80 mg Oral q1800  . insulin aspart  0-5 Units Subcutaneous QHS  . insulin aspart  0-9 Units Subcutaneous TID WC  . insulin glargine  60 Units Subcutaneous Daily  .  metoprolol succinate  50 mg Oral Daily  . pantoprazole  40 mg  Oral Daily   Continuous Infusions: . sodium chloride 125 mL/hr at 05/28/18 0636  . sodium chloride    . [START ON 05/29/2018] cefTRIAXone (ROCEPHIN)  IV    . magnesium sulfate 1 - 4 g bolus IVPB       LOS: 0 days     Vernell Leep, MD, FACP, [facility]. [facility]  To contact the attending provider between 7A-7P or the covering provider during after hours 7P-7A, please log into the web site [email or web address] and access using universal Warrior password for that web site. If you do not have the password, please call the hospital operator.  05/28/2018, 3:00 PM

## 2018-05-28 NOTE — ED Notes (Signed)
Helped get patient cleaned up patient

## 2018-05-28 NOTE — Progress Notes (Signed)
Left lower extremity venous duplex completed. Preliminary results. Positive for an acute DVT of the left leg - Full results noted in Chart review under CV Proc. [place] Rashae Rother,RVS 05/28/2018, 10:53 AM

## 2018-05-28 NOTE — Care Management Note (Signed)
Case Management Note  Patient Details  Name: Alyssa Haley MRN: [id number] Date of Birth: July 27, 1950  Subjective/Objective:                    Action/Plan:  Provided patient and family with 30 day free Eliquis card. Confirmed patient has Medicaid and gets prescriptions filled through Medicaid. Eliquis is on Medicaid perferred list. Co pay will be around $3.  Expected Discharge Date:                  Expected Discharge Plan:     In-House Referral:     Discharge planning Services  CM Consult, Medication Assistance  Post Acute Care Choice:    Choice offered to:  Patient, Adult Children  DME Arranged:  N/A DME Agency:  NA  HH Arranged:    HH Agency:     Status of Service:  In process, will continue to follow  If discussed at Long Length of Stay Meetings, dates discussed:    Additional Comments:  Kingsley PlanWile, [name], RN 05/28/2018, 3:40 PM

## 2018-05-28 NOTE — ED Notes (Signed)
Ordered a breakfast tray--Alyssa Haley 

## 2018-05-28 NOTE — Progress Notes (Signed)
ANTICOAGULATION CONSULT NOTE - Initial Consult  Pharmacy Consult for Eliquis Indication: DVT  Allergies  Allergen Reactions  . Penicillins Hives    Patient Measurements: Height: 5\' 9"  (175.3 cm) Weight: 197 lb 12 oz (89.7 kg) IBW/kg (Calculated) : 66.2  Vital Signs: Temp: 98.2 F (36.8 C) (01/22 1223) Temp Source: Oral (01/22 1223) BP: 105/59 (01/22 1223) Pulse Rate: 93 (01/22 1223)  Labs: Recent Labs    05/27/18 2305 05/27/18 2321 05/28/18 0205 05/28/18 0807  HGB 14.8  --  16.0* 13.0  HCT 46.9*  --  47.0* 42.4  PLT 318  --   --  265  APTT  --   --   --  28  LABPROT  --   --   --  13.6  INR  --   --   --  1.05  CREATININE  --  1.46*  --  0.94  TROPONINI  --   --   --  <0.03    Estimated Creatinine Clearance: 69.3 mL/min (by C-G formula based on SCr of 0.94 mg/dL).   Medical History: Past Medical History:  Diagnosis Date  . Coronary artery disease   . Diabetes mellitus without complication (HCC)   . GERD (gastroesophageal reflux disease)   . Gout   . Hyperlipidemia   . Hypertension     Assessment: 67 YOM not on AC PTA with new DVT in left leg, pharmacy consulted to start Eliquis for treatment.  CBC wnl.  Goal of Therapy:  Monitor platelets by anticoagulation protocol: Yes   Plan:  Eliquis 10mg  PO BID x 7 days, then 5mg  PO BID thereafter (starting 1/29)  Daylene Posey, PharmD Clinical Pharmacist Please check AMION for all [facility] numbers 05/28/2018 2:25 PM

## 2018-05-28 NOTE — ED Provider Notes (Signed)
[facility] Provider Note  CSN: [id number] Arrival date & time: 05/27/18 2252  Chief Complaint(s) Weakness  HPI Alyssa Haley is a 68 y.o. female with a past medical history listed below including diabetes on Toujeo and Ozempic who presents to the emergency department with several days of generalized fatigue, decreased oral intake and 1 day of watery diarrhea.  Per the daughter the patient had an episode of near syncope just prior to arrival.  She reports that she was helping the patient off of the toilet when she collapsed onto the ground.  Patient did not lose consciousness but was altered.  EMS was called who noted the patient had low palpable systolic blood pressure in the 60s.  She was given 400 cc of saline and blood pressure improved to the 80s.  They also noted that CBG was above 500.  Patient denies any recent fevers or infections.  She does endorse polydipsia and polyuria.  Denies any chest pain or shortness of breath.  Denies any abdominal pain.  Denies any headache or neck pain.  No back pain.  She is endorsing right hip pain following the fall.  HPI  Past Medical History Past Medical History:  Diagnosis Date  . Coronary artery disease   . Diabetes mellitus without complication (HCC)   . GERD (gastroesophageal reflux disease)   . Gout   . Hyperlipidemia   . Hypertension    There are no active problems to display for this patient.  Home Medication(s) Prior to Admission medications   Medication Sig Start Date End Date Taking? Authorizing Provider  allopurinol (ZYLOPRIM) 100 MG tablet Take 100 mg by mouth 2 (two) times daily.    [provider]  amLODipine (NORVASC) 10 MG tablet Take 10 mg by mouth daily.    [provider]  atorvastatin (LIPITOR) 80 MG tablet Take 80 mg by mouth daily. 12/22/17   [provider]  cephALEXin (KEFLEX) 500 MG capsule Take 1 capsule (500 mg total) by mouth 3 (three) times daily.  12/28/17   Kirichenko, Tatyana, PA-C  gabapentin (NEURONTIN) 300 MG capsule Take 300 mg by mouth 3 (three) times daily.    [provider]  losartan (COZAAR) 25 MG tablet Take 25 mg by mouth daily. 12/22/17   [provider]  metoprolol succinate (TOPROL-XL) 50 MG 24 hr tablet Take 50 mg by mouth daily. 12/22/17   [provider]  mirtazapine (REMERON) 15 MG tablet Take 15 mg by mouth at bedtime. 11/11/17   [provider]  nitrofurantoin, macrocrystal-monohydrate, (MACROBID) 100 MG capsule Take 100 mg by mouth 2 (two) times daily.    [provider]  omeprazole (PRILOSEC) 40 MG capsule Take 40 mg by mouth daily.    [provider]  oxyCODONE-acetaminophen (PERCOCET) 10-325 MG tablet Take 1 tablet by mouth every 6 (six) hours as needed for severe pain. 12/10/17   [provider]  phenazopyridine (PYRIDIUM) 200 MG tablet Take 200 mg by mouth 3 (three) times daily as needed for pain.    [provider]  rosuvastatin (CRESTOR) 40 MG tablet Take 40 mg by mouth daily.    [provider]  zolpidem (AMBIEN) 10 MG tablet Take 10 mg by mouth at bedtime. 12/22/17   [provider]  Past Surgical History Past Surgical History:  Procedure Laterality Date  . CORONARY STENT PLACEMENT     Family History No family history on file.  Social History Social History   Tobacco Use  . Smoking status: Former Games developer  . Smokeless tobacco: Never Used  Substance Use Topics  . Alcohol use: Not Currently  . Drug use: Never   Allergies Penicillins  Review of Systems Review of Systems All other systems are reviewed and are negative for acute change except as noted in the HPI  Physical Exam Vital Signs  I have reviewed the triage vital signs BP 113/72   Pulse (!) 103   Temp 98.7 F (37.1 C)  (Rectal)   Resp 13   Ht 5\' 10"  (1.778 m)   Wt 100 kg   SpO2 97%   BMI 31.63 kg/m   Physical Exam Vitals signs reviewed.  Constitutional:      General: She is not in acute distress.    Appearance: She is well-developed. She is not diaphoretic.  HENT:     Head: Normocephalic and atraumatic.     Comments: Edentulous.  Facial hair    Right Ear: External ear normal.     Left Ear: External ear normal.     Nose: Nose normal.  Eyes:     General: No scleral icterus.       Right eye: No discharge.        Left eye: No discharge.     Conjunctiva/sclera: Conjunctivae normal.     Pupils: Pupils are equal, round, and reactive to light.  Neck:     Musculoskeletal: Normal range of motion and neck supple.  Cardiovascular:     Rate and Rhythm: Regular rhythm. Tachycardia present.     Pulses:          Radial pulses are 2+ on the right side and 2+ on the left side.       Dorsalis pedis pulses are 2+ on the right side and 2+ on the left side.     Heart sounds: Normal heart sounds. No murmur. No friction rub. No gallop.   Pulmonary:     Effort: Pulmonary effort is normal. No respiratory distress.     Breath sounds: Normal breath sounds. No stridor. No wheezing or rales.  Abdominal:     General: There is no distension.     Palpations: Abdomen is soft.     Tenderness: There is no abdominal tenderness.  Musculoskeletal:     Right hip: She exhibits tenderness and bony tenderness. She exhibits no deformity.     Cervical back: She exhibits no bony tenderness.     Thoracic back: She exhibits no bony tenderness.     Lumbar back: She exhibits no bony tenderness.     Comments: Clavicles stable. Chest stable to AP/Lat compression. Pelvis stable to Lat compression.    Skin:    General: Skin is warm and dry.     Findings: No erythema or rash.  Neurological:     Mental Status: She is alert and oriented to person, place, and time.     Comments: Moving all extremities     ED Results and  Treatments Labs (all labs ordered are listed, but only abnormal results are displayed) Labs Reviewed  CBC WITH DIFFERENTIAL/PLATELET - Abnormal; Notable for the following components:      Result Value   WBC 17.9 (*)    RBC 5.59 (*)    HCT 46.9 (*)    Neutro Abs  15.7 (*)    Abs Immature Granulocytes 0.27 (*)    All other components within normal limits  URINALYSIS, ROUTINE W REFLEX MICROSCOPIC - Abnormal; Notable for the following components:   APPearance CLOUDY (*)    Glucose, UA >=500 (*)    Hgb urine dipstick MODERATE (*)    Protein, ur 30 (*)    Leukocytes, UA LARGE (*)    Bacteria, UA MANY (*)    All other components within normal limits  LACTIC ACID, PLASMA - Abnormal; Notable for the following components:   Lactic Acid, Venous 5.6 (*)    All other components within normal limits  LACTIC ACID, PLASMA - Abnormal; Notable for the following components:   Lactic Acid, Venous 5.7 (*)    All other components within normal limits  COMPREHENSIVE METABOLIC PANEL - Abnormal; Notable for the following components:   Chloride 96 (*)    Glucose, Bld 557 (*)    Creatinine, Ser 1.46 (*)    Albumin 3.3 (*)    GFR calc non Af Amer 37 (*)    GFR calc Af Amer 43 (*)    Anion gap 17 (*)    All other components within normal limits  CBG MONITORING, ED - Abnormal; Notable for the following components:   Glucose-Capillary 561 (*)    All other components within normal limits  CBG MONITORING, ED - Abnormal; Notable for the following components:   Glucose-Capillary 443 (*)    All other components within normal limits  CBG MONITORING, ED - Abnormal; Notable for the following components:   Glucose-Capillary 336 (*)    All other components within normal limits  POCT I-STAT EG7 - Abnormal; Notable for the following components:   pO2, Ven 24.0 (*)    Potassium 3.1 (*)    HCT 47.0 (*)    Hemoglobin 16.0 (*)    All other components within normal limits  CULTURE, BLOOD (ROUTINE X 2)  CULTURE,  BLOOD (ROUTINE X 2)  BLOOD GAS, VENOUS  I-STAT TROPONIN, ED  CBG MONITORING, ED                                                                                                                         EKG  EKG Interpretation  Date/Time:  Tuesday May 27 2018 23:12:15 EST Ventricular Rate:  111 PR Interval:    QRS Duration: 77 QT Interval:  324 QTC Calculation: 441 R Axis:   86 Text Interpretation:  Sinus tachycardia Borderline right axis deviation Borderline T abnormalities, diffuse leads NO STEMI Confirmed by Drema Pry [phone]) on 05/27/2018 11:20:21 PM      Radiology Dg Hip Unilat W Or W/o Pelvis 2-3 Views Right  Result Date: 05/28/2018 CLINICAL DATA:  RIGHT hip pain for 2 days.  No injury. EXAM: DG HIP (WITH OR WITHOUT PELVIS) 2-3V RIGHT COMPARISON:  None. FINDINGS: There is no evidence of hip fracture or dislocation. Mild bilateral superolateral acetabular spurring. No advanced arthropathy or other focal bone abnormality.  Mild aortoiliac calcifications. IMPRESSION: No acute osseous process. Aortic Atherosclerosis (ICD10-I70.0). Electronically Signed   By: Awilda Metro M.D.   On: 05/28/2018 01:02   Pertinent labs & imaging results that were available during my care of the patient were reviewed by me and considered in my medical decision making (see chart for details).  Medications Ordered in ED Medications  insulin regular, human (MYXREDLIN) 100 units/ 100 mL infusion (2.8 Units/hr Intravenous Rate/Dose Change 05/28/18 0229)  sodium chloride 0.9 % bolus 1,000 mL (0 mLs Intravenous Stopped 05/28/18 0212)    And  sodium chloride 0.9 % bolus 1,000 mL (1,000 mLs Intravenous New Bag/Given 05/28/18 0228)    And  0.9 %  sodium chloride infusion (has no administration in time range)  cefTRIAXone (ROCEPHIN) 1 g in sodium chloride 0.9 % 100 mL IVPB (1 g Intravenous New Bag/Given 05/28/18 0229)  potassium chloride 10 mEq in 100 mL IVPB (has no administration in time range)  sodium  chloride 0.9 % bolus 1,000 mL (has no administration in time range)                                                                                                                                    Procedures .Critical Care Performed by: Nira Conn, MD Authorized by: Nira Conn, MD     CRITICAL CARE Performed by: Amadeo Garnet Nury Nebergall Total critical care time: 55 minutes Critical care time was exclusive of separately billable procedures and treating other patients. Critical care was necessary to treat or prevent imminent or life-threatening deterioration. Critical care was time spent personally by me on the following activities: development of treatment plan with patient and/or surrogate as well as nursing, discussions with consultants, evaluation of patient's response to treatment, examination of patient, obtaining history from patient or surrogate, ordering and performing treatments and interventions, ordering and review of laboratory studies, ordering and review of radiographic studies, pulse oximetry and re-evaluation of patient's condition.   (including critical care time)  Medical Decision Making / ED Course I have reviewed the nursing notes for this encounter and the patient's prior records (if available in EHR or on provided paperwork).    Patient presents with several days of generalized fatigue found to be hypotensive by EMS.  Upon arrival patient is afebrile with soft blood pressures now with systolics in the 100s.  Patient is tachycardic to the 120s and normal sinus rhythm.  EKG showing mild ST segment depression in the inferior lateral leads which is new from prior.   However patient is not complaining of any chest pain or shortness of breath. Initial troponin negative.  Given 2 additional liters of IV fluids.  She denies any recent infectious symptoms.   Work-up notable for leukocytosis; no anemia; hyperglycemia above 500 with anion gap; no other  significant electrolyte derangements; elevated lactic acid greater than 5; VBG without acidosis.  Does not appear to be DKA.  Mild renal insufficiency when compared to prior.  No source of infection at this time but check and UA.  Given Rocephin empirically.  Plain film of the hip negative for acute fracture.  Likely contusion from fall.  2:52 AM UA returned and suspicious for urinary tract infection.  Given identification of source, code sepsis was initiated.  1 additional liter of IV fluids was given to receive 30 cc/kg of IV fluid.  Prior to this patient's blood pressure has significantly improved with systolics in the 120s, and heart rates in the 90s.   Final Clinical Impression(s) / ED Diagnoses Final diagnoses:  Sepsis due to urinary tract infection (HCC)  Hyperglycemia      This chart was dictated using voice recognition software.  Despite best efforts to proofread,  errors can occur which can change the documentation meaning.   Nira Conn, MD 05/28/18 [phone]

## 2018-05-28 NOTE — ED Notes (Signed)
Blood Gas, Venous Abnormal Results PO2 of 24. Dr. Eudelia Bunch has been notified.

## 2018-05-28 NOTE — H&P (Signed)
History and Physical    Koralynn Greenspan HQI:[id number] DOB: Jan 26, 1951 DOA: 05/27/2018  PCP: System, Pcp Not In  Patient coming from: Home.  Chief Complaint: Fall and weakness.  HPI: Alyssa Haley is a 68 y.o. female with history of diabetes mellitus type 2, CAD status post stenting, hypertension, hyperlipidemia, gout was brought to the ER after patient felt weak and almost had a fall while being helped to the bathroom by her daughter.  Patient states she was going to the bathroom to urinate when she was held by her daughter but on the way she felt weak and slumped.  Did not hit her head or lose consciousness.  Did not have any chest pain or shortness of breath nausea vomiting or diarrhea.  Over the last 3 to 4 days patient has been feeling weak and not herself.  Over the last 1 month patient has been having some swelling of the left knee and was planning to go to her primary care physician and had an appointment but on the day of appointment patient was not able to make it to the appointment because of weather conditions.  ED Course: In the ER patient blood pressure was in the low normal with lactate of 5.6 with tachycardia and blood work showing leukocytosis.  UA is consistent with UTI.  Patient does have some swelling of the left knee mild warmth to touch.  X-ray of the hip did not show any fractures.  Patient was started on fluid bolus for sepsis and since patient blood sugars in the 577 with anion gap of 17 but I think patient is not in DKA was started on insulin infusion by the ER physician.  Patient states she has been compliant with her insulin.  Patient admitted for uncontrolled diabetes mellitus type 2 acute renal failure with sepsis likely from UTI.  Will need further work-up on the left knee swelling.  Review of Systems: As per HPI, rest all negative.   Past Medical History:  Diagnosis Date  . Coronary artery disease   . Diabetes mellitus without complication (HCC)   . GERD  (gastroesophageal reflux disease)   . Gout   . Hyperlipidemia   . Hypertension     Past Surgical History:  Procedure Laterality Date  . CARDIAC CATHETERIZATION    . CORONARY ANGIOPLASTY    . CORONARY STENT PLACEMENT    . REPLACEMENT TOTAL KNEE       reports that she has quit smoking. She has never used smokeless tobacco. She reports previous alcohol use. She reports that she does not use drugs.  Allergies  Allergen Reactions  . Penicillins Hives    Family History  Problem Relation Age of Onset  . Diabetes Mellitus II Neg Hx     Prior to Admission medications   Medication Sig Start Date End Date Taking? Authorizing Provider  allopurinol (ZYLOPRIM) 100 MG tablet Take 100 mg by mouth 2 (two) times daily.   Yes [provider]  amLODipine (NORVASC) 10 MG tablet Take 10 mg by mouth daily.   Yes [provider]  atorvastatin (LIPITOR) 80 MG tablet Take 80 mg by mouth daily. 12/22/17  Yes [provider]  gabapentin (NEURONTIN) 300 MG capsule Take 300 mg by mouth 3 (three) times daily as needed (pain).    Yes [provider]  Insulin Glargine, 1 Unit Dial, (TOUJEO SOLOSTAR) 300 UNIT/ML SOPN Inject 70 Units into the skin daily.   Yes [provider]  losartan (COZAAR) 25 MG tablet  Take 25 mg by mouth daily. 12/22/17  Yes [provider]  metoprolol succinate (TOPROL-XL) 50 MG 24 hr tablet Take 50 mg by mouth daily. 12/22/17  Yes [provider]  mirtazapine (REMERON) 15 MG tablet Take 15 mg by mouth at bedtime as needed (sleep).  11/11/17  Yes [provider]  omeprazole (PRILOSEC) 40 MG capsule Take 40 mg by mouth daily.   Yes [provider]  oxyCODONE-acetaminophen (PERCOCET) 10-325 MG tablet Take 1 tablet by mouth every 6 (six) hours as needed for severe pain. 12/10/17  Yes [provider]  phenazopyridine (PYRIDIUM) 200 MG tablet Take 200 mg by mouth 3 (three) times daily as needed for pain.   Yes  [provider]  rosuvastatin (CRESTOR) 40 MG tablet Take 40 mg by mouth daily.   Yes [provider]  Semaglutide, 1 MG/DOSE, (OZEMPIC, 1 MG/DOSE,) 2 MG/1.5ML SOPN Inject 25 mg into the skin once a week. Monday   Yes [provider]  zolpidem (AMBIEN) 10 MG tablet Take 10 mg by mouth at bedtime as needed for sleep.  12/22/17  Yes [provider]  cephALEXin (KEFLEX) 500 MG capsule Take 1 capsule (500 mg total) by mouth 3 (three) times daily. Patient not taking: Reported on 05/28/2018 12/28/17   Jaynie Crumble, PA-C    Physical Exam: Vitals:   05/28/18 0200 05/28/18 0230 05/28/18 0300 05/28/18 0400  BP: 121/78 129/76 111/73 137/78  Pulse: (!) 103 97 100 (!) 107  Resp:   19   Temp:      TempSrc:      SpO2: 97% 97% 96% 98%  Weight:      Height:          Constitutional: Moderately built and nourished. Vitals:   05/28/18 0200 05/28/18 0230 05/28/18 0300 05/28/18 0400  BP: 121/78 129/76 111/73 137/78  Pulse: (!) 103 97 100 (!) 107  Resp:   19   Temp:      TempSrc:      SpO2: 97% 97% 96% 98%  Weight:      Height:       Eyes: Anicteric no pallor. ENMT: No discharge from the ears eyes nose or mouth. Neck: No mass felt.  No neck rigidity but no JVD appreciated. Respiratory: No rhonchi or crepitations. Cardiovascular: S1-S2 heard. Abdomen: Soft nontender bowel sounds present. Musculoskeletal: Left knee swelling.  Mildly warm to touch. Skin: No rash. Neurologic: Alert awake oriented to time place and person.  Moves all extremities. Psychiatric: Appears normal per normal affect.   Labs on Admission: I have personally reviewed following labs and imaging studies  CBC: Recent Labs  Lab 05/27/18 2305 05/28/18 0205  WBC 17.9*  --   NEUTROABS 15.7*  --   HGB 14.8 16.0*  HCT 46.9* 47.0*  MCV 83.9  --   PLT 318  --    Basic Metabolic Panel: Recent Labs  Lab 05/27/18 2321 05/28/18 0205  NA 135 140  K 3.6 3.1*  CL 96*  --   CO2 22   --   GLUCOSE 557*  --   BUN 19  --   CREATININE 1.46*  --   CALCIUM 9.6  --    GFR: Estimated Creatinine Clearance: 47.9 mL/min (A) (by C-G formula based on SCr of 1.46 mg/dL (H)). Liver Function Tests: Recent Labs  Lab 05/27/18 2321  AST 25  ALT 18  ALKPHOS 96  BILITOT 0.9  PROT 7.9  ALBUMIN 3.3*   No results for input(s):  LIPASE, AMYLASE in the last 168 hours. No results for input(s): AMMONIA in the last 168 hours. Coagulation Profile: No results for input(s): INR, PROTIME in the last 168 hours. Cardiac Enzymes: No results for input(s): CKTOTAL, CKMB, CKMBINDEX, TROPONINI in the last 168 hours. BNP (last 3 results) No results for input(s): PROBNP in the last 8760 hours. HbA1C: No results for input(s): HGBA1C in the last 72 hours. CBG: Recent Labs  Lab 05/27/18 2253 05/28/18 0113 05/28/18 0225 05/28/18 0329 05/28/18 0442  GLUCAP 561* 443* 336* 255* 152*   Lipid Profile: No results for input(s): CHOL, HDL, LDLCALC, TRIG, CHOLHDL, LDLDIRECT in the last 72 hours. Thyroid Function Tests: No results for input(s): TSH, T4TOTAL, FREET4, T3FREE, THYROIDAB in the last 72 hours. Anemia Panel: No results for input(s): VITAMINB12, FOLATE, FERRITIN, TIBC, IRON, RETICCTPCT in the last 72 hours. Urine analysis:    Component Value Date/Time   COLORURINE YELLOW 05/28/2018 0212   APPEARANCEUR CLOUDY (A) 05/28/2018 0212   LABSPEC 1.027 05/28/2018 0212   PHURINE 5.0 05/28/2018 0212   GLUCOSEU >=500 (A) 05/28/2018 0212   HGBUR MODERATE (A) 05/28/2018 0212   BILIRUBINUR NEGATIVE 05/28/2018 0212   KETONESUR NEGATIVE 05/28/2018 0212   PROTEINUR 30 (A) 05/28/2018 0212   NITRITE NEGATIVE 05/28/2018 0212   LEUKOCYTESUR LARGE (A) 05/28/2018 0212   Sepsis Labs: @LABRCNTIP (procalcitonin:4,lacticidven:4) )No results found for this or any previous visit (from the past 240 hour(s)).   Radiological Exams on Admission: Dg Hip Unilat W Or W/o Pelvis 2-3 Views Right  Result Date:  05/28/2018 CLINICAL DATA:  RIGHT hip pain for 2 days.  No injury. EXAM: DG HIP (WITH OR WITHOUT PELVIS) 2-3V RIGHT COMPARISON:  None. FINDINGS: There is no evidence of hip fracture or dislocation. Mild bilateral superolateral acetabular spurring. No advanced arthropathy or other focal bone abnormality. Mild aortoiliac calcifications. IMPRESSION: No acute osseous process. Aortic Atherosclerosis (ICD10-I70.0). Electronically Signed   By: Awilda Metro M.D.   On: 05/28/2018 01:02    EKG: Independently reviewed.  Sinus tachycardia with diffuse T wave changes.  Assessment/Plan Principal Problem:   Sepsis (HCC) Active Problems:   CAD (coronary artery disease)   Essential hypertension   Uncontrolled type 2 diabetes mellitus with hyperglycemia (HCC)   Gout   Acute lower UTI   Swelling of joint of left knee    1. Sepsis likely from UTI -patient is placed on ceftriaxone follow cultures lactate levels procalcitonin levels continue with hydration. 2. Uncontrolled diabetes mellitus type 2 with hyperglycemia -was started on insulin infusion.  Will change to patient's long-acting insulin once blood sugar drops less than 250.  Check hemoglobin A1c. 3. Left knee swelling with history of gout we will check uric acid levels and x-rays.  If unrevealing may need arthrocentesis. 4. Acute renal failure creatinine on August 2018 was normal.  Presently it is 1.4.  Likely from low blood pressure and possible poor p.o. intake.  I think will improve with hydration.  Follow metabolic panel. 5. Hypertension -his blood pressure is in the low normal I am holding off patient's ARB and amlodipine and keeping patient on PRN IV hydralazine.  Continue beta-blockers. 6. History of CAD -denies any chest pain.  Troponin is pending.  EKG was showing sinus tachycardia.  Which is improved with fluids.  Patient is on aspirin statins and beta-blockers. 7. History of gout on allopurinol.  See #3.   DVT prophylaxis: SCDs for now  until we make sure there is no procedures needed for the left knee. Code Status:  Full code. Family Communication: Patient's daughter. Disposition Plan: Home. Consults called: None. Admission status: Inpatient.   Eduard ClosArshad N Zaliyah Meikle MD [facility] Pager [phone].  If 7PM-7AM, please contact night-coverage [email or web address] Password [email or web address]  05/28/2018, 5:53 AM

## 2018-05-29 ENCOUNTER — Inpatient Hospital Stay (HOSPITAL_COMMUNITY): Payer: Medicare Other

## 2018-05-29 DIAGNOSIS — I82402 Acute embolism and thrombosis of unspecified deep veins of left lower extremity: Secondary | ICD-10-CM

## 2018-05-29 DIAGNOSIS — I82409 Acute embolism and thrombosis of unspecified deep veins of unspecified lower extremity: Secondary | ICD-10-CM

## 2018-05-29 LAB — GLUCOSE, CAPILLARY
Glucose-Capillary: 78 mg/dL (ref 70–99)
Glucose-Capillary: 188 mg/dL — ABNORMAL HIGH (ref 70–99)
Glucose-Capillary: 153 mg/dL — ABNORMAL HIGH (ref 70–99)
Glucose-Capillary: 68 mg/dL — ABNORMAL LOW (ref 70–99)
Glucose-Capillary: 103 mg/dL — ABNORMAL HIGH (ref 70–99)

## 2018-05-29 LAB — BASIC METABOLIC PANEL
ANION GAP: 8 (ref 5–15)
BUN: 14 mg/dL (ref 8–23)
CO2: 23 mmol/L (ref 22–32)
Calcium: 8.7 mg/dL — ABNORMAL LOW (ref 8.9–10.3)
Chloride: 107 mmol/L (ref 98–111)
Creatinine, Ser: 0.82 mg/dL (ref 0.44–1.00)
GFR calc Af Amer: >60 mL/min (ref >60)
Glucose, Bld: 180 mg/dL — ABNORMAL HIGH (ref 70–99)
Potassium: 3.2 mmol/L — ABNORMAL LOW (ref 3.5–5.1)
Sodium: 138 mmol/L (ref 135–145)

## 2018-05-29 LAB — CBC
HCT: 41.7 % (ref 36.0–46.0)
Hemoglobin: 13.2 g/dL (ref 12.0–15.0)
MCH: 26.5 pg (ref 26.0–34.0)
MCHC: 31.7 g/dL (ref 30.0–36.0)
MCV: 83.6 fL (ref 80.0–100.0)
PLATELETS: 244 10*3/uL (ref 150–400)
RBC: 4.99 MIL/uL (ref 3.87–5.11)
RDW: 14.7 % (ref 11.5–15.5)
WBC: 9.6 10*3/uL (ref 4.0–10.5)
nRBC: 0.2 % (ref 0.0–0.2)

## 2018-05-29 LAB — MAGNESIUM: Magnesium: 2.1 mg/dL (ref 1.7–2.4)

## 2018-05-29 MED ORDER — POTASSIUM CHLORIDE CRYS ER 20 MEQ PO TBCR
40.0000 meq | EXTENDED_RELEASE_TABLET | Freq: Once | ORAL | Status: AC
  Administered 2018-05-29: 40 meq via ORAL
  Filled 2018-05-29: qty 2

## 2018-05-29 MED ORDER — GLUCERNA SHAKE PO LIQD
237.0000 mL | Freq: Three times a day (TID) | ORAL | Status: DC
  Administered 2018-05-29 – 2018-06-02 (×12): 237 mL via ORAL

## 2018-05-29 MED ORDER — ADULT MULTIVITAMIN W/MINERALS CH
1.0000 | ORAL_TABLET | Freq: Every day | ORAL | Status: DC
  Administered 2018-05-29 – 2018-06-02 (×5): 1 via ORAL
  Filled 2018-05-29 (×5): qty 1

## 2018-05-29 MED ORDER — WHITE PETROLATUM EX OINT
TOPICAL_OINTMENT | CUTANEOUS | Status: AC
  Administered 2018-05-29: 18:00:00
  Filled 2018-05-29: qty 56.7

## 2018-05-29 MED ORDER — GABAPENTIN 300 MG PO CAPS
300.0000 mg | ORAL_CAPSULE | Freq: Two times a day (BID) | ORAL | Status: DC
  Administered 2018-05-29 – 2018-06-02 (×9): 300 mg via ORAL
  Filled 2018-05-29 (×9): qty 1

## 2018-05-29 NOTE — Progress Notes (Signed)
Hypoglycemic Event  CBG: 68  Treatment: 4 oz juice/soda  Symptoms: None  Follow-up CBG: Time:2306 CBG Result:103  Possible Reasons for Event: Inadequate meal intake  Comments/MD notified: Stevie Kern via text page    Montel Clock

## 2018-05-29 NOTE — Plan of Care (Signed)
  Problem: Pain Managment: Goal: General experience of comfort will improve Outcome: Progressing   Problem: Safety: Goal: Ability to remain free from injury will improve Outcome: Progressing   Problem: Skin Integrity: Goal: Risk for impaired skin integrity will decrease Outcome: Progressing   

## 2018-05-29 NOTE — Progress Notes (Signed)
Initial Nutrition Assessment  DOCUMENTATION CODES:   Not applicable  INTERVENTION:   -D/c Ensure Enlive po BID, each supplement provides 350 kcal and 20 grams of protein -Glucerna Shake po TID, each supplement provides 220 kcal and 10 grams of protein -MVI with minerals daily  NUTRITION DIAGNOSIS:   Inadequate oral intake related to decreased appetite as evidenced by per patient/family report.  GOAL:   Patient will meet greater than or equal to 90% of their needs  MONITOR:   PO intake, Supplement acceptance, Labs, Weight trends, Skin, I & O's  REASON FOR ASSESSMENT:   Malnutrition Screening Tool    ASSESSMENT:   68 year old female with PMH of DM 2/IDDM, HTN, HLD, CAD status post PCI, GERD and gout who presented [facility] ED on 05/27/2018 due to 3 to 4 days history of generalized weakness and fall at home.  She reported dysuria and several days history of left lower extremity swelling, intermittent bilateral hip and knee pains.  She was admitted for sepsis likely due to UTI, uncontrolled type II DM, acute renal failure and extensive left lower extremity DVT.  Pt admitted with sepsis and UTI.   Case discussed with RN prior to visit, who reports pt just returned from vascular ultrasound to rule out DVT.   Spoke with pt at bedside, who reports decreased appetite over the past 3-4 months. Pt shares that she has not been eating as well as she used to, as she now lives at her daughter's house and does not like the food she is served. Pt consumes 3 meals per day, but often only consumes a few bites of each item at meals, "because I don't have a taste of it or care for it". Pt shares that pt daughter recently started cooking more healthfully, but could not provide additional insight into this.   Pt has been eating well since being admitted to the hospital. She estimates she consumed about 75% of breakfast. This RD also helped set up her lunch tray; pt was consuming mashed potatoes and  broccoli without difficulty during interview.   Pt reports her UBW is around 220#. She estimates she has lost approximately 40# within the past 4 months. Reviewed wt hx; noted pt has experienced a 10.1% wt loss over the past 5 months, which is significant for time frame. Suspect uncontrolled DM may be contributing to weight loss.  Pt also endorses decreased mobility, having to seek assistance from her daughter and grandchildren for ADLs.   Discussed with pt importance of good meal and supplement intake to promote healing.   Last Hgb A1c: (11.1) on 05/28/18. PTA DM medications are 70 units insulin glargien daily.   Labs reviewed: K: 3.2, CBGS: 78-188 (inpatient orders for glycemic control are0-5 units insulin aspart q HS, 0-9 units insulin aspart TID with meals, 60 uniys insulin glargine daily ).   NUTRITION - FOCUSED PHYSICAL EXAM:    Most Recent Value  Orbital Region  No depletion  Upper Arm Region  No depletion  Thoracic and Lumbar Region  No depletion  Buccal Region  No depletion  Temple Region  No depletion  Clavicle Bone Region  No depletion  Clavicle and Acromion Bone Region  No depletion  Scapular Bone Region  No depletion  Dorsal Hand  No depletion  Patellar Region  No depletion  Anterior Thigh Region  No depletion  Posterior Calf Region  No depletion  Edema (RD Assessment)  Mild  Hair  Reviewed  Eyes  Reviewed  Mouth  Reviewed  Skin  Reviewed  Nails  Reviewed       Diet Order:   Diet Order            Diet heart healthy/carb modified Room service appropriate? Yes; Fluid consistency: Thin  Diet effective now              EDUCATION NEEDS:   Education needs have been addressed  Skin:  Skin Assessment: Reviewed RN Assessment  Last BM:  05/28/18  Height:   Ht Readings from Last 1 Encounters:  05/28/18 5\' 9"  (1.753 m)    Weight:   Wt Readings from Last 1 Encounters:  05/28/18 89.7 kg    Ideal Body Weight:  65.9 kg  BMI:  Body mass index is 29.2  kg/m.  Estimated Nutritional Needs:   Kcal:  1750-1950  Protein:  95-110 grams  Fluid:  > 1.7 L    Kemonie Cutillo A. Mayford Knife, RD, LDN, CDE Pager: [phone] After hours Pager: [phone]

## 2018-05-29 NOTE — Progress Notes (Signed)
VASCULAR LAB PRELIMINARY  PRELIMINARY  PRELIMINARY  PRELIMINARY  Right lower extremity venous duplex completed.    Preliminary report:  See CV Proc  Yamila Cragin, RVT 05/29/2018, 1:08 PM

## 2018-05-29 NOTE — Progress Notes (Signed)
PROGRESS NOTE   Alyssa Haley  TIR:[id number]    DOB: [birth year]    DOA: 05/27/2018  PCP: [name], MD   I have briefly reviewed patients previous medical records in [facility].  Brief Narrative:  68 year old female with PMH of DM 2/IDDM, HTN, HLD, CAD status post PCI, GERD and gout who presented to [facility] ED on 05/27/2018 due to 3 to 4 days history of generalized weakness and fall at home.  She reported dysuria and several days history of left lower extremity swelling, intermittent bilateral hip and knee pains.  She was admitted for sepsis likely due to UTI, uncontrolled type II DM, acute renal failure and extensive left lower extremity DVT.   Assessment & Plan:   Principal Problem:   Sepsis ([place]) Active Problems:   CAD (coronary artery disease)   Essential hypertension   Uncontrolled type 2 diabetes mellitus with hyperglycemia (HCC)   Gout   Acute lower UTI   Swelling of joint of left knee   Sepsis secondary to UTI ([place])   Sepsis due to UTI: Met sepsis criteria on admission.  Treated per protocol with IV fluids and started empirically on IV ceftriaxone, continue.  Sepsis physiology has resolved.  Blood cultures x2: Negative to date.  Unfortunately it appears that urine culture was never sent from ED and would not be helpful now that she has been on antibiotics.  Continue empiric treatment.  Uncontrolled type II DM/IDDM with hyperglycemia: Presented with blood sugar of 557 but no DKA.  Briefly on IV insulin, improved glycemic control, discontinued overnight of admission.  Transitioned to Lantus 60 units daily (on Toujeo 70 units daily at home) and added NovoLog SSI.  Monitor closely and adjust insulins as needed.  A1c 11.1 suggests very poor outpatient control.  Reasonable inpatient control.  Diabetes coordinator input appreciated.  Question of compliance to diet and medications.  Need to discuss with daughter.  Elevated lactate: Secondary to UTI sepsis.  Treated with  IV fluids.  Lactate has improved from greater 5.6-2.7.    Left lower extremity extensive DVT Patient not very mobile.  No recent long distance travel history.  No history of prior VTE.  Left lower extremity venous Doppler confirms extensive DVT as below.  Discussed extensively with patient/daughter regarding anticoagulation, risks and benefits, options including warfarin/Lovenox bridge, Eliquis/Xarelto, Pradaxa, risks and benefits of each.  They decided to go along with Eliquis, initiated per pharmacy.  Duration of anticoagulation at least 3 months.  X-rays of both hips and left knee without acute findings.  Planing of right lower extremity pain and hence performed venous Doppler and negative for DVT in right lower extremity.  Some of her lower extremity pains probably neuropathic, resumed gabapentin.  Acute renal failure: Creatinine was normal in August 2018.  Presented with creatinine of 1.4.  Likely due to dehydration.  Resolved after IV fluid hydration.  Follow BMP periodically.  ARB temporarily held.  Essential hypertension: Had soft blood pressures in the ED, temporarily holding off on ARB and amlodipine.  Continue beta-blockers and PRN IV hydralazine.  Controlled.  CAD status post PCI: Asymptomatic of chest pain.  Troponin x1: Negative.  Continue statins and beta-blockers.  Gout: No acute gouty flare.  Continue allopurinol.  Hyperlipidemia:  Hypokalemia Replace and follow.  Hypomagnesemia Replaced.   DVT prophylaxis: Initiated Eliquis 2/22 Code Status: Full Family Communication: None at bedside today. Disposition: DC home pending clinical improvement, possibly 1/24 pending therapies evaluation.   Consultants:  None  Procedures:  None  Antimicrobials:  IV ceftriaxone   Subjective: Ongoing pains in her lower extremity, reports burning, pins-and-needles at times.  Patient interviewed and examined with RN at bedside.  No acute issues reported by RN.  ROS: As above,  otherwise negative.  Objective:  Vitals:   05/28/18 2108 05/29/18 0506 05/29/18 0829 05/29/18 1656  BP: 99/62 101/64 134/77 117/67  Pulse: 78 81 85 75  Resp: '19 19  17  ' Temp: 98.4 F (36.9 C) 98.2 F (36.8 C)  98.5 F (36.9 C)  TempSrc: Oral Oral  Oral  SpO2: 98% 99%  100%  Weight:      Height:        Examination:  General exam: Pleasant middle-aged female, moderately built and overweight lying comfortably propped up in bed. Respiratory system: Clear to auscultation. Respiratory effort normal.  Stable Cardiovascular system: S1 & S2 heard, RRR. No JVD, murmurs, rubs, gallops or clicks.  Telemetry personally reviewed: Sinus rhythm. Gastrointestinal system: Abdomen is nondistended, soft and nontender. No organomegaly or masses felt. Normal bowel sounds heard.  Stable Central nervous system: Alert and oriented x2. No focal neurological deficits. Extremities: Symmetric 5 x 5 power in upper extremities and at least grade 4 x 5 in lower extremities.  Left lower extremity with diffuse swelling from thigh down to the leg, 1+ pitting edema.  Bilateral knees with TKR scars but no acute findings. Skin: No rashes, lesions or ulcers Psychiatry: Judgement and insight appear impaired. Mood & affect appropriate.     Data Reviewed: I have personally reviewed following labs and imaging studies  CBC: Recent Labs  Lab 05/27/18 2305 05/28/18 0205 05/28/18 0807 05/29/18 0150  WBC 17.9*  --  14.2* 9.6  NEUTROABS 15.7*  --  10.4*  --   HGB 14.8 16.0* 13.0 13.2  HCT 46.9* 47.0* 42.4 41.7  MCV 83.9  --  84.8 83.6  PLT 318  --  265 662   Basic Metabolic Panel: Recent Labs  Lab 05/27/18 2321 05/28/18 0205 05/28/18 0807 05/29/18 0150  NA 135 140 140 138  K 3.6 3.1* 3.6 3.2*  CL 96*  --  106 107  CO2 22  --  20* 23  GLUCOSE 557*  --  105* 180*  BUN 19  --  16 14  CREATININE 1.46*  --  0.94 0.82  CALCIUM 9.6  --  9.1 8.7*  MG  --   --  1.4* 2.1   Liver Function Tests: Recent Labs   Lab 05/27/18 2321 05/28/18 0807  AST 25 20  ALT 18 13  ALKPHOS 96 83  BILITOT 0.9 0.4  PROT 7.9 6.4*  ALBUMIN 3.3* 2.9*   Coagulation Profile: Recent Labs  Lab 05/28/18 0807  INR 1.05   Cardiac Enzymes: Recent Labs  Lab 05/28/18 0807  TROPONINI <0.03   HbA1C: Recent Labs    05/28/18 0724  HGBA1C 11.1*   CBG: Recent Labs  Lab 05/28/18 1735 05/28/18 2110 05/29/18 0805 05/29/18 1144 05/29/18 1634  GLUCAP 155* 162* 78 188* 153*    Recent Results (from the past 240 hour(s))  Blood Culture (routine x 2)     Status: None (Preliminary result)   Collection Time: 05/28/18  4:26 AM  Result Value Ref Range Status   Specimen Description BLOOD RIGHT HAND  Final   Special Requests   Final    BOTTLES DRAWN AEROBIC ONLY Blood Culture results may not be optimal due to an inadequate volume of blood received in culture bottles   Culture  Final    NO GROWTH 1 DAY Performed at [facility], [place] [address], [place], [place] [address]    Report Status PENDING  Incomplete  Blood Culture (routine x 2)     Status: None (Preliminary result)   Collection Time: 05/28/18  8:27 AM  Result Value Ref Range Status   Specimen Description BLOOD BLOOD LEFT HAND  Final   Special Requests   Final    BOTTLES DRAWN AEROBIC ONLY Blood Culture adequate volume   Culture   Final    NO GROWTH 1 DAY Performed at [facility], [place] [address] [place], [place] [address]    Report Status PENDING  Incomplete         Radiology Studies: Dg Knee 1-2 Views Left  Result Date: 05/28/2018 CLINICAL DATA:  Left knee pain today EXAM: LEFT KNEE - 1-2 VIEW COMPARISON:  None. FINDINGS: Total knee arthroplasty that is well seated. Normal joint alignment. No fracture or erosion. No definite joint effusion. There is nonspecific generalized subcutaneous reticulation IMPRESSION: No acute finding. 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 Electronically Signed   By: Monte Fantasia M.D.    On: 05/28/2018 06:18   Dg Chest Port 1 View  Result Date: 05/28/2018 CLINICAL DATA:  Sepsis EXAM: PORTABLE CHEST 1 VIEW COMPARISON:  None. FINDINGS: Normal heart size. Aortic tortuosity accentuated by rightward rotation. There is no edema, consolidation, effusion, or pneumothorax. Artifact from EKG leads. Prominent degenerative endplate spurring. IMPRESSION: No acute finding. Electronically Signed   By: Monte Fantasia M.D.   On: 05/28/2018 06:17   Dg Hip Unilat With Pelvis 2-3 Views Left  Result Date: 05/28/2018 CLINICAL DATA:  Left worse than right hip pain. Status post fall today. Initial encounter. EXAM: DG HIP (WITH OR WITHOUT PELVIS) 2-3V LEFT COMPARISON:  Plain films of the right hip 05/28/2018 FINDINGS: No acute bony or joint abnormality is seen. Mild degenerative change is present about the hips. Atherosclerosis noted. IMPRESSION: No acute abnormality. Electronically Signed   By: Inge Rise M.D.   On: 05/28/2018 09:21   Dg Hip Unilat W Or W/o Pelvis 2-3 Views Right  Result Date: 05/28/2018 CLINICAL DATA:  RIGHT hip pain for 2 days.  No injury. EXAM: DG HIP (WITH OR WITHOUT PELVIS) 2-3V RIGHT COMPARISON:  None. FINDINGS: There is no evidence of hip fracture or dislocation. Mild bilateral superolateral acetabular spurring. No advanced arthropathy or other focal bone abnormality. Mild aortoiliac calcifications. IMPRESSION: No acute osseous process. Aortic Atherosclerosis (ICD10-I70.0). Electronically Signed   By: Elon Alas M.D.   On: 05/28/2018 01:02   Vas [place] Lower Extremity Venous (dvt)  Result Date: 05/29/2018  Lower Venous Study Indications: Edema, and DVT found 05/28/18 in the left leg.  Performing Technologist: Sharion Dove RVS  Examination Guidelines: A complete evaluation includes B-mode imaging, spectral Doppler, color Doppler, and power Doppler as needed of all accessible portions of each vessel. Bilateral testing is considered an integral part of a complete examination.  Limited examinations for reoccurring indications may be performed as noted.  Right Venous Findings: +---------+---------------+---------+-----------+----------+----------+          CompressibilityPhasicitySpontaneityPropertiesSummary    +---------+---------------+---------+-----------+----------+----------+ CFV      Full           Yes      Yes                             +---------+---------------+---------+-----------+----------+----------+ SFJ  Full                                                    +---------+---------------+---------+-----------+----------+----------+ FV Prox  Full                                                    +---------+---------------+---------+-----------+----------+----------+ FV Mid   Full                                                    +---------+---------------+---------+-----------+----------+----------+ FV DistalFull                                                    +---------+---------------+---------+-----------+----------+----------+ PFV      Full                                                    +---------+---------------+---------+-----------+----------+----------+ POP                     Yes      Yes                  visualized +---------+---------------+---------+-----------+----------+----------+ PTV      Full                                                    +---------+---------------+---------+-----------+----------+----------+ PERO     Full                                                    +---------+---------------+---------+-----------+----------+----------+  Left Venous Findings: +---+---------------+---------+-----------+----------+-------+    CompressibilityPhasicitySpontaneityPropertiesSummary +---+---------------+---------+-----------+----------+-------+ CFVNone           No       No                   Acute   +---+---------------+---------+-----------+----------+-------+     Summary: Right: There is no evidence of deep vein thrombosis in the lower extremity. Ultrasound characteristics of enlarged lymph nodes are noted in the groin. Left: Findings consistent with known acute deep vein thrombosis involving the left common femoral vein.  *See table(s) above for measurements and observations. Electronically signed by Monica Martinez MD on 05/29/2018 at 2:10:48 PM.    Final    Vas [place] Lower Extremity Venous (dvt)  Result Date: 05/28/2018  Lower Venous Study Indications: Edema.  Performing Technologist: Toma Copier RVS  Examination Guidelines: A complete evaluation includes B-mode imaging, spectral Doppler, color Doppler, and  power Doppler as needed of all accessible portions of each vessel. Bilateral testing is considered an integral part of a complete examination. Limited examinations for reoccurring indications may be performed as noted.  Right Venous Findings: +---+---------------+---------+-----------+----------+-------+    CompressibilityPhasicitySpontaneityPropertiesSummary +---+---------------+---------+-----------+----------+-------+ CFVFull           Yes      Yes                          +---+---------------+---------+-----------+----------+-------+ SFJFull                                                 +---+---------------+---------+-----------+----------+-------+  Left Venous Findings: +---------+---------------+---------+-----------+----------+-------------------+          CompressibilityPhasicitySpontaneityPropertiesSummary             +---------+---------------+---------+-----------+----------+-------------------+ CFV      None           No       No                                       +---------+---------------+---------+-----------+----------+-------------------+ SFJ      Partial        No       Yes                                      +---------+---------------+---------+-----------+----------+-------------------+ FV  Prox  None           No       No                                       +---------+---------------+---------+-----------+----------+-------------------+ FV Mid   None                                                             +---------+---------------+---------+-----------+----------+-------------------+ FV DistalNone           No       No                                       +---------+---------------+---------+-----------+----------+-------------------+ PFV      Full           No       Yes                                      +---------+---------------+---------+-----------+----------+-------------------+ POP      None           No       No                                       +---------+---------------+---------+-----------+----------+-------------------+  PTV      Partial        No       No                                       +---------+---------------+---------+-----------+----------+-------------------+ PERO                                                  Unable to visualize                                                       well enough to                                                            evaluate.           +---------+---------------+---------+-----------+----------+-------------------+ External iliac vein thrombosed. Unable to see IVC due to bowel gas    Summary: Right: There is no evidence of a common femoral vein obstruction Left: Findings consistent with acute deep vein thrombosis involving the left common femoral vein, left femoral vein, left popliteal vein, and left posterior tibial vein. See technician comments listed above  *See table(s) above for measurements and observations. Electronically signed by Curt Jews MD on 05/28/2018 at 2:31:50 PM.    Final         Scheduled Meds: . allopurinol  100 mg Oral BID  . apixaban  10 mg Oral BID   Followed by  . [START ON 06/04/2018] apixaban  5 mg Oral BID  . atorvastatin   80 mg Oral q1800  . feeding supplement (GLUCERNA SHAKE)  237 mL Oral TID BM  . gabapentin  300 mg Oral BID  . insulin aspart  0-5 Units Subcutaneous QHS  . insulin aspart  0-9 Units Subcutaneous TID WC  . insulin glargine  60 Units Subcutaneous Daily  . metoprolol succinate  50 mg Oral Daily  . multivitamin with minerals  1 tablet Oral Daily  . pantoprazole  40 mg Oral Daily   Continuous Infusions: . cefTRIAXone (ROCEPHIN)  IV 1 g (05/29/18 0549)     LOS: 1 day     Vernell Leep, MD, FACP, [facility]. [facility]  To contact the attending provider between 7A-7P or the covering provider during after hours 7P-7A, please log into the web site [email or web address] and access using universal [place] password for that web site. If you do not have the password, please call the hospital operator.  05/29/2018, 6:39 PM

## 2018-05-29 NOTE — Progress Notes (Addendum)
Inpatient Diabetes Program Recommendations  AACE/ADA: New Consensus Statement on Inpatient Glycemic Control (2015)  Target Ranges:  Prepandial:   less than 140 mg/dL      Peak postprandial:   less than 180 mg/dL (1-2 hours)      Critically ill patients:  140 - 180 mg/dL   Lab Results  Component Value Date   GLUCAP 188 (H) 05/29/2018   HGBA1C 11.1 (H) 05/28/2018   DM 2   Spoke to patient regarding current Hgb A1c of 11.1% (average of 272 mg/dl over past 2-3 month period). Explained what an A1c is and what it measures. Reminded patient that goal A1c is 7% or less per ADA standards to prevent both acute and long-term complications. Explained to patient the importance of good glucose control at home. Encouraged patient to check CBGs at least bid at home (fasting and another check within the day) and to record all CBGs in a logbook for PCP to review. She did tell me when she has seen her PCP at home he often tells her that her sugar is "too high".   Patient lives in [place], [place] and has been here staying with her daughter in town for the past two months. States she does have her CBG machine with her at her daughter's house and it is in working order. States she has been taking her insulin prior as ordered to admission.  Discussed what she likes to drink and she stated diet drinks and orange juice. Reinforced that the diet sodas are better for diabetes than the regular sodas. Explained that juices also have a lot of sugar in them and encouraged adding  waters/flavored waters.    Thank you.  -- Will follow during hospitalization.--  Jamelle Rushing RN, MSN Diabetes Coordinator Inpatient Glycemic Control Team Team Pager: [phone] (8am-5pm)

## 2018-05-30 LAB — GLUCOSE, CAPILLARY
Glucose-Capillary: 139 mg/dL — ABNORMAL HIGH (ref 70–99)
Glucose-Capillary: 211 mg/dL — ABNORMAL HIGH (ref 70–99)
Glucose-Capillary: 179 mg/dL — ABNORMAL HIGH (ref 70–99)
Glucose-Capillary: 179 mg/dL — ABNORMAL HIGH (ref 70–99)

## 2018-05-30 LAB — BASIC METABOLIC PANEL
Anion gap: 7 (ref 5–15)
BUN: 12 mg/dL (ref 8–23)
CO2: 24 mmol/L (ref 22–32)
CREATININE: 0.96 mg/dL (ref 0.44–1.00)
Calcium: 9.4 mg/dL (ref 8.9–10.3)
Chloride: 106 mmol/L (ref 98–111)
GFR calc Af Amer: >60 mL/min (ref >60)
GFR calc non Af Amer: >60 mL/min (ref >60)
Glucose, Bld: 114 mg/dL — ABNORMAL HIGH (ref 70–99)
Potassium: 3.4 mmol/L — ABNORMAL LOW (ref 3.5–5.1)
Sodium: 137 mmol/L (ref 135–145)

## 2018-05-30 MED ORDER — INSULIN GLARGINE 100 UNIT/ML ~~LOC~~ SOLN
50.0000 [IU] | Freq: Every day | SUBCUTANEOUS | Status: DC
  Administered 2018-05-30: 50 [IU] via SUBCUTANEOUS
  Filled 2018-05-30 (×2): qty 0.5

## 2018-05-30 MED ORDER — POTASSIUM CHLORIDE CRYS ER 20 MEQ PO TBCR
40.0000 meq | EXTENDED_RELEASE_TABLET | Freq: Once | ORAL | Status: AC
  Administered 2018-05-30: 40 meq via ORAL
  Filled 2018-05-30: qty 2

## 2018-05-30 NOTE — [place] FL2 (Signed)
[place] MEDICAID FL2 LEVEL OF CARE SCREENING TOOL     IDENTIFICATION  Patient Name: Alyssa Haley Birthdate: 1950/06/29 Sex: female Admission Date (Current Location): 05/27/2018  [facility] and [place] Number:  Producer, television/film/video and Address:  [facility] [place], [address] [place], [place] [address]      Provider Number: [id number]  Attending Physician Name and Address:  Elease Etienne, MD  Relative Name and Phone Number:  Ival Bible; daughter; [phone]    Current Level of Care: Hospital Recommended Level of Care: Skilled Nursing Facility Prior Approval Number:    Date Approved/Denied:   PASRR Number: [id number]  Discharge Plan: SNF    Current Diagnoses: Patient Active Problem List   Diagnosis Date Noted  . Sepsis (HCC) 05/28/2018  . CAD (coronary artery disease) 05/28/2018  . Essential hypertension 05/28/2018  . Uncontrolled type 2 diabetes mellitus with hyperglycemia (HCC) 05/28/2018  . Gout 05/28/2018  . Acute lower UTI 05/28/2018  . Swelling of joint of left knee 05/28/2018  . Sepsis secondary to UTI (HCC) 05/28/2018    Orientation RESPIRATION BLADDER Height & Weight     Self, Situation, Place  Normal Incontinent Weight: 197 lb 12 oz (89.7 kg) Height:  5\' 9"  (175.3 cm)  BEHAVIORAL SYMPTOMS/MOOD NEUROLOGICAL BOWEL NUTRITION STATUS      Continent Diet(see discharge summary)  AMBULATORY STATUS COMMUNICATION OF NEEDS Skin   Extensive Assist Verbally Skin abrasions, Other (Comment)(Dry/Flaky Skin; Abrasion on L Elbow)                       Personal Care Assistance Level of Assistance  Bathing, Feeding, Dressing Bathing Assistance: Maximum assistance Feeding assistance: Independent Dressing Assistance: Maximum assistance     Functional Limitations Info  Hearing, Sight, Speech Sight Info: Adequate Hearing Info: Adequate Speech Info: Adequate    SPECIAL CARE FACTORS FREQUENCY  PT (By licensed PT), OT (By licensed  OT)     PT Frequency: 5x week OT Frequency: 5x week            Contractures Contractures Info: Not present    Additional Factors Info  Code Status, Allergies, Insulin Sliding Scale Code Status Info: Full Code Allergies Info: PENICILLINS    Insulin Sliding Scale Info: insulin aspart (novoLOG) injection 0-5 Units daily at bedtime; insulin aspart (novoLOG) injection 0-9 Units 3x daily with meals; insulin glargine (LANTUS) injection 50 Units        Current Medications (05/30/2018):  This is the current hospital active medication list Current Facility-Administered Medications  Medication Dose Route Frequency Provider Last Rate Last Dose  . acetaminophen (TYLENOL) tablet 650 mg  650 mg Oral Q6H PRN Eduard Clos, MD   650 mg at 05/30/18 2035   Or  . acetaminophen (TYLENOL) suppository 650 mg  650 mg Rectal Q6H PRN Eduard Clos, MD      . allopurinol (ZYLOPRIM) tablet 100 mg  100 mg Oral BID Eduard Clos, MD   100 mg at 05/30/18 1056  . apixaban (ELIQUIS) tablet 10 mg  10 mg Oral BID Daylene Posey, RPH   10 mg at 05/30/18 1056   Followed by  . [START ON 06/04/2018] apixaban (ELIQUIS) tablet 5 mg  5 mg Oral BID Daylene Posey, [facility]      . atorvastatin (LIPITOR) tablet 80 mg  80 mg Oral q1800 Eduard Clos, MD   80 mg at 05/29/18 1751  . cefTRIAXone (ROCEPHIN) 1 g in sodium chloride 0.9 % 100  mL IVPB  1 g Intravenous Q24H Eduard Clos, MD 200 mL/hr at 05/30/18 0634 1 g at 05/30/18 0634  . dextrose 50 % solution 25 mL  25 mL Intravenous PRN Eduard Clos, MD      . feeding supplement (GLUCERNA SHAKE) (GLUCERNA SHAKE) liquid 237 mL  237 mL Oral TID BM Hongalgi, Maximino Greenland, MD   237 mL at 05/30/18 1055  . gabapentin (NEURONTIN) capsule 300 mg  300 mg Oral BID Marcellus Scott D, MD   300 mg at 05/30/18 1056  . hydrALAZINE (APRESOLINE) injection 10 mg  10 mg Intravenous Q4H PRN Eduard Clos, MD      . insulin aspart (novoLOG) injection 0-5  Units  0-5 Units Subcutaneous QHS Hongalgi, Anand D, MD      . insulin aspart (novoLOG) injection 0-9 Units  0-9 Units Subcutaneous TID WC Elease Etienne, MD   1 Units at 05/30/18 [phone]  . insulin glargine (LANTUS) injection 50 Units  50 Units Subcutaneous Daily Elease Etienne, MD   50 Units at 05/30/18 1056  . metoprolol succinate (TOPROL-XL) 24 hr tablet 50 mg  50 mg Oral Daily Eduard Clos, MD   50 mg at 05/30/18 1056  . mirtazapine (REMERON) tablet 15 mg  15 mg Oral QHS PRN Eduard Clos, MD   15 mg at 05/29/18 2032  . multivitamin with minerals tablet 1 tablet  1 tablet Oral Daily Elease Etienne, MD   1 tablet at 05/30/18 1056  . ondansetron (ZOFRAN) tablet 4 mg  4 mg Oral Q6H PRN Eduard Clos, MD       Or  . ondansetron [facility]) injection 4 mg  4 mg Intravenous Q6H PRN Eduard Clos, MD      . oxyCODONE-acetaminophen (PERCOCET/ROXICET) 5-325 MG per tablet 1 tablet  1 tablet Oral Q6H PRN Eduard Clos, MD   1 tablet at 05/30/18 0434   And  . oxyCODONE (Oxy IR/ROXICODONE) immediate release tablet 5 mg  5 mg Oral Q6H PRN Eduard Clos, MD   5 mg at 05/30/18 0434  . pantoprazole (PROTONIX) EC tablet 40 mg  40 mg Oral Daily Eduard Clos, MD   40 mg at 05/30/18 1056     Discharge Medications: Please see discharge summary for a list of discharge medications.  Relevant Imaging Results:  Relevant Lab Results:   Additional Information SS#[id number] [place], [place]

## 2018-05-30 NOTE — Evaluation (Signed)
Physical Therapy Evaluation Patient Details Name: Alyssa Haley MRN: [id number] DOB: 06-Feb-1951 Today's Date: 05/30/2018   History of Present Illness  68 year old female with PMH of DM 2/IDDM, HTN, HLD, CAD status post PCI, GERD and gout who presented to [facility] ED on 05/27/2018 due to 3 to 4 days history of generalized weakness and fall at home. She was admitted for sepsis likely due to UTI, uncontrolled type II DM, acute renal failure and extensive left lower extremity DVT.     Clinical Impression  Pt admitted with above diagnosis. Pt currently with functional limitations due to the deficits listed below (see PT Problem List). On eval, pt required max assist bed mobility, +2 mod assist sit to stand, and total assist in stedy bed to recliner transfer. Pt will benefit from skilled PT to increase their independence and safety with mobility to allow discharge to the venue listed below.       Follow Up Recommendations SNF    Equipment Recommendations  Other (comment)(TBD at next venue)    Recommendations for Other Services       Precautions / Restrictions Precautions Precautions: Fall Restrictions Weight Bearing Restrictions: No      Mobility  Bed Mobility Overal bed mobility: Needs Assistance Bed Mobility: Supine to Sit     Supine to sit: HOB elevated;Max assist     General bed mobility comments: +rail, cues for sequencing, use of bed pad to scoot to EOB  Transfers Overall transfer level: Needs assistance   Transfers: Sit to/from Stand;Stand Pivot Transfers Sit to Stand: +2 physical assistance;Mod assist Stand pivot transfers: Total assist       General transfer comment: increased time, +2 mod assist sit to stand in stedy. Total assist transfer in stedy bed>BSC>recliner.  Ambulation/Gait             General Gait Details: unable  Stairs            Wheelchair Mobility    Modified Rankin (Stroke Patients Only)       Balance Overall balance assessment:  Needs assistance Sitting-balance support: Feet supported;Single extremity supported Sitting balance-Leahy Scale: Fair     Standing balance support: Bilateral upper extremity supported;During functional activity Standing balance-Leahy Scale: Poor Standing balance comment: reliant on BUE and external support                             Pertinent Vitals/Pain Pain Assessment: Faces Faces Pain Scale: Hurts a little bit Pain Location: LLE during mobility Pain Descriptors / Indicators: Sore;Grimacing;Guarding Pain Intervention(s): Monitored during session;Repositioned    Home Living Family/patient expects to be discharged to:: Private residence Living Arrangements: Children Available Help at Discharge: Family;Available 24 hours/day Type of Home: House Home Access: Stairs to enter   [employer] of Steps: unsure Home Layout: Two level;Bed/bath upstairs Home Equipment: Walker - 2 wheels Additional Comments: home set-up provided by patient. Unsure of accuracy as pt is a poor historian.    Prior Function Level of Independence: Needs assistance   Gait / Transfers Assistance Needed: ambulates with rollator  ADL's / Homemaking Assistance Needed: daughter assists with ADLs        Hand Dominance   Dominant Hand: Left    Extremity/Trunk Assessment   Upper Extremity Assessment Upper Extremity Assessment: Generalized weakness    Lower Extremity Assessment Lower Extremity Assessment: Generalized weakness;LLE deficits/detail LLE Deficits / Details: DVT, edema       Communication   Communication: No difficulties  Cognition Arousal/Alertness: Awake/alert Behavior During Therapy: WFL for tasks assessed/performed Overall Cognitive Status: No family/caregiver present to determine baseline cognitive functioning                                 General Comments: Oriented to self only. Slow to respond to questions. Decreased initiation. Decreased  motor planning and ability to sequence. Demonstrates self-limiting behavior.       General Comments      Exercises     Assessment/Plan    PT Assessment Patient needs continued PT services  PT Problem List Decreased strength;Decreased balance;Decreased cognition;Pain;Decreased mobility;Decreased activity tolerance       PT Treatment Interventions DME instruction;Functional mobility training;Balance training;Patient/family education;Gait training;Therapeutic activities;Therapeutic exercise    PT Goals (Current goals can be found in the Care Plan section)  Acute Rehab PT Goals Patient Stated Goal: not stated PT Goal Formulation: With patient Time For Goal Achievement: 06/13/18 Potential to Achieve Goals: Fair    Frequency Min 2X/week   Barriers to discharge        Co-evaluation               AM-PAC PT "6 Clicks" Mobility  Outcome Measure Help needed turning from your back to your side while in a flat bed without using bedrails?: A Lot Help needed moving from lying on your back to sitting on the side of a flat bed without using bedrails?: A Lot Help needed moving to and from a bed to a chair (including a wheelchair)?: Total Help needed standing up from a chair using your arms (e.g., wheelchair or bedside chair)?: A Lot Help needed to walk in hospital room?: Total Help needed climbing 3-5 steps with a railing? : Total 6 Click Score: 9    End of Session Equipment Utilized During Treatment: Gait belt Activity Tolerance: Patient tolerated treatment well Patient left: in chair;with chair alarm set;with call bell/phone within reach Nurse Communication: Mobility status;Need for lift equipment PT Visit Diagnosis: Other abnormalities of gait and mobility (R26.89);Muscle weakness (generalized) (M62.81);Pain Pain - Right/Left: Left Pain - part of body: Leg    Time: 0915-1001 PT Time Calculation (min) (ACUTE ONLY): 46 min   Charges:   PT Evaluation $PT Eval Moderate  Complexity: 1 Mod PT Treatments $Gait Training: 8-22 mins $Self Care/Home Management: 8-22        Aida Raider, PT  Office # [phone] Pager [phone]   Ilda Foil 05/30/2018, 10:55 AM

## 2018-05-30 NOTE — Care Management Important Message (Signed)
Important Message  Patient Details  Name: Alyssa Haley MRN: [id number] Date of Birth: July 26, 1950   Medicare Important Message Given:  Yes    Keyra Virella P Merril Isakson 05/30/2018, 11:53 AM

## 2018-05-30 NOTE — Clinical Social Work Note (Addendum)
Visited room and provided daughter Laurina Bustle with facility responses. Unit CSW will continue to follow and assist with discharge planning.  Genelle Bal, MSW, LCSW Licensed Clinical Social Worker Clinical Social Work Department [employer] [phone]

## 2018-05-30 NOTE — Clinical Social Work Note (Signed)
Clinical Social Work Assessment  Patient Details  Name: Alyssa Haley MRN: [id number] Date of Birth: [birth year]  Date of referral:  05/30/18               Reason for consult:  Discharge Planning, Facility Placement                Permission sought to share information with:  Family Supports, Magazine features editoracility Contact Representative Permission granted to share information::  No(pt with fluctuating orientation; pt daughter is her caregiver)  Name::     Office managerTramajah   Agency::  SNFs  Relationship::  daughter  Contact Information:  [phone]  Housing/Transportation Living arrangements for the past 2 months:  Single Family Home Source of Information:  Adult Children Patient Interpreter Needed:  None Criminal Activity/Legal Involvement Pertinent to Current Situation/Hospitalization:  No - Comment as needed Significant Relationships:  Adult Children, Merchandiser, retailCommunity Support, Other(Comment), Other Family Members(CAP services) Lives with:  Adult Children Do you feel safe going back to the place where you live?  Yes Need for family participation in patient care:  Yes (Comment)  Care giving concerns: Pt from home with daughter, she utilizes a rolling walker at baseline and does receive some CAP services in her home. Pt daughter would like rehab as she has noticed pt is getting weaker and would benefit from intensive therapies.   Social Worker assessment / plan:  CSW spoke with pt daughter on phone since pt has some fluctuating orientation and pt daughter is caregiver. Introduced self, role, and reason for call. Pt daughter states she takes care of pt in the home and has CAP services also. Pt daughter states she has noticed that pt is getting progressively weaker and is interested in SNF before pt returns home.   Pt daughter new to [place] area relatively, so she would like referral sent and offers provided to her as they become available. CSW will send out referral and f/u with offers as able.    Employment status:  Disabled (Comment on whether or not currently receiving Disability) Insurance information:  [place] Recommendations:  Skilled Nursing Facility, 24 Hour Supervision Information / Referral to community resources:  Skilled Nursing Facility  Patient/Family's Response to care:Pt daughter amenable to speaking with CSW, she is in agreement with therapy recommendations and would like pt to have more support to strengthen before coming home.  Patient/Family's Understanding of and Emotional Response to Diagnosis, Current Treatment, and Prognosis:  Pt daughter states understanding of diagnosis, current treatment and prognosis. Pt daughter prefers SNF before pt returns home with CAP and her assistance. Pt daughter emotionally appropriate and sounds involved and supportive with pt care.   Emotional Assessment Appearance:  Appears stated age Attitude/Demeanor/Rapport:  Unable to Assess Affect (typically observed):  Unable to Assess Orientation:  Oriented to Self, Oriented to Place, Fluctuating Orientation (Suspected and/or reported Sundowners) Alcohol / Substance use:  Not Applicable Psych involvement (Current and /or in the community):  No (Comment)  Discharge Needs  Concerns to be addressed:  Care Coordination Readmission within the last 30 days:  No Current discharge risk:  Dependent with Mobility, Physical Impairment Barriers to Discharge:  Continued Medical Work up   Dillard'ssabel H Rileigh Kawashima, LCSWA 05/30/2018, 11:15 AM

## 2018-05-30 NOTE — Progress Notes (Signed)
PROGRESS NOTE   Alyssa Haley  HGD:[id number]    DOB: 1950/05/17    DOA: 05/27/2018  PCP: [name], MD   I have briefly reviewed patients previous medical records in [facility].  Brief Narrative:  68 year old female with PMH of DM 2/IDDM, HTN, HLD, CAD status post PCI, GERD and gout who presented to [facility] ED on 05/27/2018 due to 3 to 4 days history of generalized weakness and fall at home.  She reported dysuria and several days history of left lower extremity swelling, intermittent bilateral hip and knee pains.  She was admitted for sepsis likely due to UTI, uncontrolled type II DM, acute renal failure and extensive left lower extremity DVT.  Clinically improved.  Medically stable for DC to SNF pending bed availability and insurance approval.  Assessment & Plan:   Principal Problem:   Sepsis ([place]) Active Problems:   CAD (coronary artery disease)   Essential hypertension   Uncontrolled type 2 diabetes mellitus with hyperglycemia ([place])   Gout   Acute lower UTI   Swelling of joint of left knee   Sepsis secondary to UTI ([place])   Sepsis due to UTI: Met sepsis criteria on admission.  Treated per protocol with IV fluids and started empirically on IV ceftriaxone, continue.  Sepsis physiology has resolved.  Blood cultures x2: Negative to date.  Unfortunately it appears that urine culture was never sent from ED and would not be helpful now that she has been on antibiotics.  Continue empiric treatment.  Completed 3 days of IV ceftriaxone.  Consider switching to oral Keflex at discharge to complete 5 to 7 days treatment.  Uncontrolled type II DM/IDDM with hyperglycemia: Presented with blood sugar of 557 but no DKA.  Briefly on IV insulin, improved glycemic control, discontinued overnight of admission.  Transitioned to Lantus 60 units daily (on Toujeo 70 units daily at home) and added NovoLog SSI.  Monitor closely and adjust insulins as needed.  A1c 11.1 suggests very poor outpatient  control.  Diabetes coordinator input appreciated.  Question of compliance to diet and medications.  Had a hypoglycemic episode last night/68 mg per DL.  Reduced Lantus further to 50 units at bedtime.  Elevated lactate: Secondary to UTI sepsis.  Treated with IV fluids.  Lactate has improved from greater 5.6-2.7.    Left lower extremity extensive DVT Patient not very mobile.  No recent long distance travel history.  No history of prior VTE.  Left lower extremity venous Doppler confirms extensive DVT as below.  Discussed extensively with patient/daughter regarding anticoagulation, risks and benefits, options including warfarin/Lovenox bridge, Eliquis/Xarelto, Pradaxa, risks and benefits of each.  They decided to go along with Eliquis, initiated per pharmacy.  Duration of anticoagulation at least 3 months.  X-rays of both hips and left knee without acute findings.  Planing of right lower extremity pain and hence performed venous Doppler and negative for DVT in right lower extremity.  Some of her lower extremity pains probably neuropathic, resumed gabapentin.  Pain better controlled now.  Acute renal failure: Creatinine was normal in August 2018.  Presented with creatinine of 1.4.  Likely due to dehydration.  Resolved after IV fluid hydration.  Follow BMP periodically.  ARB temporarily held.  Essential hypertension: Had soft blood pressures in the ED, temporarily holding off on ARB and amlodipine.  Continue beta-blockers and PRN IV hydralazine.  Controlled.  CAD status post PCI: Asymptomatic of chest pain.  Troponin x1: Negative.  Continue statins and beta-blockers.  Gout: No acute  gouty flare.  Continue allopurinol.  Hyperlipidemia:  Hypokalemia Replace and follow.  Hypomagnesemia Replaced.   DVT prophylaxis: Initiated Eliquis 2/22 Code Status: Full Family Communication: None at bedside today. Disposition: DC SNF pending bed availability and insurance approval.  Consultants:   None  Procedures:  None  Antimicrobials:  IV ceftriaxone   Subjective: Overall feels better.  Pain is better controlled.  No other complaints reported.  As per RN, no acute issues noted.  ROS: As above, otherwise negative.  Objective:  Vitals:   05/29/18 1656 05/29/18 2144 05/30/18 0442 05/30/18 1526  BP: 117/67 (!) 97/55 105/67 122/78  Pulse: 75 76 73 77  Resp: _0 Temp: 98.5 F (36.9 C) 97.7 F (36.5 C) 98 F (36.7 C) 97.8 F (36.6 C)  TempSrc: Oral Oral Oral Oral  SpO2: 100% 100% 100% 100%  Weight:      Height:        Examination:  General exam: Pleasant middle-aged female, moderately built and overweight sitting up in bed eating breakfast by herself this morning. Respiratory system: Clear to auscultation. Respiratory effort normal.  Stable. Cardiovascular system: S1 & S2 heard, RRR. No JVD, murmurs, rubs, gallops or clicks.  Stable Gastrointestinal system: Abdomen is nondistended, soft and nontender. No organomegaly or masses felt. Normal bowel sounds heard.  Stable Central nervous system: Alert and oriented x2. No focal neurological deficits. Extremities: Symmetric 5 x 5 power in upper extremities and at least grade 4 x 5 in lower extremities.  Left lower extremity with diffuse swelling from thigh down to the leg, 1+ pitting edema.  Bilateral knees with TKR scars but no acute findings. Skin: No rashes, lesions or ulcers Psychiatry: Judgement and insight appear impaired. Mood & affect appropriate.     Data Reviewed: I have personally reviewed following labs and imaging studies  CBC: Recent Labs  Lab 05/27/18 2305 05/28/18 0205 05/28/18 0807 05/29/18 0150  WBC 17.9*  --  14.2* 9.6  NEUTROABS 15.7*  --  10.4*  --   HGB 14.8 16.0* 13.0 13.2  HCT 46.9* 47.0* 42.4 41.7  MCV 83.9  --  84.8 83.6  PLT 318  --  265 696   Basic Metabolic Panel: Recent Labs  Lab 05/27/18 2321 05/28/18 0205 05/28/18 0807 05/29/18 0150 05/30/18 0600  NA 135 140  140 138 137  K 3.6 3.1* 3.6 3.2* 3.4*  CL 96*  --  106 107 106  CO2 22  --  20* 23 24  GLUCOSE 557*  --  105* 180* 114*  BUN 19  --  _1 CREATININE 1.46*  --  0.94 0.82 0.96  CALCIUM 9.6  --  9.1 8.7* 9.4  MG  --   --  1.4* 2.1  --    Liver Function Tests: Recent Labs  Lab 05/27/18 2321 05/28/18 0807  AST 25 20  ALT 18 13  ALKPHOS 96 83  BILITOT 0.9 0.4  PROT 7.9 6.4*  ALBUMIN 3.3* 2.9*   Coagulation Profile: Recent Labs  Lab 05/28/18 0807  INR 1.05   Cardiac Enzymes: Recent Labs  Lab 05/28/18 0807  TROPONINI <0.03   HbA1C: Recent Labs    05/28/18 0724  HGBA1C 11.1*   CBG: Recent Labs  Lab 05/29/18 1634 05/29/18 2146 05/29/18 2306 05/30/18 0814 05/30/18 1232  GLUCAP 153* 68* 103* 139* 211*    Recent Results (from the past 240 hour(s))  Blood Culture (routine x 2)     Status: None (Preliminary result)  Collection Time: 05/28/18  4:26 AM  Result Value Ref Range Status   Specimen Description BLOOD RIGHT HAND  Final   Special Requests   Final    BOTTLES DRAWN AEROBIC ONLY Blood Culture results may not be optimal due to an inadequate volume of blood received in culture bottles   Culture   Final    NO GROWTH 2 DAYS Performed at [facility], [place] [address] [place], [place] [address]    Report Status PENDING  Incomplete  Blood Culture (routine x 2)     Status: None (Preliminary result)   Collection Time: 05/28/18  8:27 AM  Result Value Ref Range Status   Specimen Description BLOOD BLOOD LEFT HAND  Final   Special Requests   Final    BOTTLES DRAWN AEROBIC ONLY Blood Culture adequate volume   Culture   Final    NO GROWTH 2 DAYS Performed at [facility], [place] [address] [place], [place] [address]    Report Status PENDING  Incomplete         Radiology Studies: Vas [place] Lower Extremity Venous (dvt)  Result Date: 05/29/2018  Lower Venous Study Indications: Edema, and DVT found 05/28/18 in the left leg.  Performing Technologist:  Sharion Dove RVS  Examination Guidelines: A complete evaluation includes B-mode imaging, spectral Doppler, color Doppler, and power Doppler as needed of all accessible portions of each vessel. Bilateral testing is considered an integral part of a complete examination. Limited examinations for reoccurring indications may be performed as noted.  Right Venous Findings: +---------+---------------+---------+-----------+----------+----------+          CompressibilityPhasicitySpontaneityPropertiesSummary    +---------+---------------+---------+-----------+----------+----------+ CFV      Full           Yes      Yes                             +---------+---------------+---------+-----------+----------+----------+ SFJ      Full                                                    +---------+---------------+---------+-----------+----------+----------+ FV Prox  Full                                                    +---------+---------------+---------+-----------+----------+----------+ FV Mid   Full                                                    +---------+---------------+---------+-----------+----------+----------+ FV DistalFull                                                    +---------+---------------+---------+-----------+----------+----------+ PFV      Full                                                    +---------+---------------+---------+-----------+----------+----------+  POP                     Yes      Yes                  visualized +---------+---------------+---------+-----------+----------+----------+ PTV      Full                                                    +---------+---------------+---------+-----------+----------+----------+ PERO     Full                                                    +---------+---------------+---------+-----------+----------+----------+  Left Venous Findings:  +---+---------------+---------+-----------+----------+-------+    CompressibilityPhasicitySpontaneityPropertiesSummary +---+---------------+---------+-----------+----------+-------+ CFVNone           No       No                   Acute   +---+---------------+---------+-----------+----------+-------+    Summary: Right: There is no evidence of deep vein thrombosis in the lower extremity. Ultrasound characteristics of enlarged lymph nodes are noted in the groin. Left: Findings consistent with known acute deep vein thrombosis involving the left common femoral vein.  *See table(s) above for measurements and observations. Electronically signed by Monica Martinez MD on 05/29/2018 at 2:10:48 PM.    Final         Scheduled Meds: . allopurinol  100 mg Oral BID  . apixaban  10 mg Oral BID   Followed by  . [START ON 06/04/2018] apixaban  5 mg Oral BID  . atorvastatin  80 mg Oral q1800  . feeding supplement (GLUCERNA SHAKE)  237 mL Oral TID BM  . gabapentin  300 mg Oral BID  . insulin aspart  0-5 Units Subcutaneous QHS  . insulin aspart  0-9 Units Subcutaneous TID WC  . insulin glargine  50 Units Subcutaneous Daily  . metoprolol succinate  50 mg Oral Daily  . multivitamin with minerals  1 tablet Oral Daily  . pantoprazole  40 mg Oral Daily   Continuous Infusions: . cefTRIAXone (ROCEPHIN)  IV 1 g (05/30/18 7824)     LOS: 2 days     Vernell Leep, MD, FACP, [facility]. [facility]  To contact the attending provider between 7A-7P or the covering provider during after hours 7P-7A, please log into the web site [email or web address] and access using universal [place] password for that web site. If you do not have the password, please call the hospital operator.  05/30/2018, 4:41 PM

## 2018-05-31 LAB — GLUCOSE, CAPILLARY
GLUCOSE-CAPILLARY: 49 mg/dL — AB (ref 70–99)
Glucose-Capillary: 41 mg/dL — CL (ref 70–99)
Glucose-Capillary: 48 mg/dL — ABNORMAL LOW (ref 70–99)
Glucose-Capillary: 95 mg/dL (ref 70–99)
Glucose-Capillary: 177 mg/dL — ABNORMAL HIGH (ref 70–99)
Glucose-Capillary: 109 mg/dL — ABNORMAL HIGH (ref 70–99)
Glucose-Capillary: 128 mg/dL — ABNORMAL HIGH (ref 70–99)

## 2018-05-31 LAB — BASIC METABOLIC PANEL
Anion gap: 8 (ref 5–15)
BUN: 13 mg/dL (ref 8–23)
CO2: 25 mmol/L (ref 22–32)
Calcium: 9 mg/dL (ref 8.9–10.3)
Chloride: 103 mmol/L (ref 98–111)
Creatinine, Ser: 0.92 mg/dL (ref 0.44–1.00)
GFR calc Af Amer: >60 mL/min (ref >60)
GFR calc non Af Amer: >60 mL/min (ref >60)
GLUCOSE: 86 mg/dL (ref 70–99)
Potassium: 4.2 mmol/L (ref 3.5–5.1)
Sodium: 136 mmol/L (ref 135–145)

## 2018-05-31 MED ORDER — DEXTROSE 50 % IV SOLN
INTRAVENOUS | Status: AC
  Administered 2018-05-31: 09:00:00
  Filled 2018-05-31: qty 50

## 2018-05-31 MED ORDER — INSULIN GLARGINE 100 UNIT/ML ~~LOC~~ SOLN
35.0000 [IU] | Freq: Every day | SUBCUTANEOUS | Status: DC
  Administered 2018-05-31 – 2018-06-02 (×3): 35 [IU] via SUBCUTANEOUS
  Filled 2018-05-31 (×3): qty 0.35

## 2018-05-31 NOTE — Progress Notes (Signed)
PROGRESS NOTE   Alyssa Haley  ZOX:[id number]    DOB: 05-03-1951    DOA: 05/27/2018  PCP: [name], MD   I have briefly reviewed patients previous medical records in [facility].  Brief Narrative:  68 year old female with PMH of DM 2/IDDM, HTN, HLD, CAD status post PCI, GERD and gout who presented to [facility] ED on 05/27/2018 due to 3 to 4 days history of generalized weakness and fall at home.  She reported dysuria and several days history of left lower extremity swelling, intermittent bilateral hip and knee pains.  She was admitted for sepsis likely due to UTI, uncontrolled type II DM, acute renal failure and extensive left lower extremity DVT.  Clinically improved.  Medically stable for DC to SNF pending bed availability and insurance approval.  Assessment & Plan:   Principal Problem:   Sepsis ([name]) Active Problems:   CAD (coronary artery disease)   Essential hypertension   Uncontrolled type 2 diabetes mellitus with hyperglycemia ([name])   Gout   Acute lower UTI   Swelling of joint of left knee   Sepsis secondary to UTI ([name])   Sepsis due to UTI: Met sepsis criteria on admission.  Treated per protocol with IV fluids and started empirically on IV ceftriaxone, continue.  Sepsis physiology has resolved.  Blood cultures x2: Negative to date.  Unfortunately it appears that urine culture was never sent from ED and would not be helpful now that she has been on antibiotics.  Continue empiric treatment.  Completed 4 days of IV ceftriaxone.  Changed to Keflex to complete total 7 days course.  Uncontrolled type II DM/IDDM with hyperglycemia: Presented with blood sugar of 557 but no DKA.  Briefly on IV insulin, improved glycemic control, discontinued overnight of admission.  Transitioned to Lantus 60 units daily (on Toujeo 70 units daily at home) and added NovoLog SSI.  Monitor closely and adjust insulins as needed.  A1c 11.1 suggests very poor outpatient control.  Diabetes coordinator  input appreciated.  Question of compliance to diet and medications.  Due to hypoglycemic episode, had reduced Lantus to 50 daily but again had CBGs in the 40s this morning, reduce Lantus further to 35 units daily (half of her home dose).  Monitor closely  Elevated lactate: Secondary to UTI sepsis.  Treated with IV fluids.  Lactate has improved from greater 5.6-2.7.    Left lower extremity extensive DVT Patient not very mobile.  No recent long distance travel history.  No history of prior VTE.  Left lower extremity venous Doppler confirms extensive DVT as below.  Discussed extensively with patient/daughter regarding anticoagulation, risks and benefits, options including warfarin/Lovenox bridge, Eliquis/Xarelto, Pradaxa, risks and benefits of each.  They decided to go along with Eliquis, initiated per pharmacy.  Duration of anticoagulation at least 3 months.  X-rays of both hips and left knee without acute findings.  Planing of right lower extremity pain and hence performed venous Doppler and negative for DVT in right lower extremity.  Some of her lower extremity pains probably neuropathic, resumed gabapentin.  Pain better controlled now.  Acute renal failure: Creatinine was normal in August 2018.  Presented with creatinine of 1.4.  Likely due to dehydration.  Resolved after IV fluid hydration.  Follow BMP periodically.  ARB temporarily held.  Essential hypertension: Had soft blood pressures in the ED, temporarily holding off on ARB and amlodipine.  Continue beta-blockers and PRN IV hydralazine.  Controlled.  CAD status post PCI: Asymptomatic of chest pain.  Troponin  x1: Negative.  Continue statins and beta-blockers.  Gout: No acute gouty flare.  Continue allopurinol.  Hyperlipidemia:  Hypokalemia Replaced.  Hypomagnesemia Replaced.   DVT prophylaxis: Initiated Eliquis 2/22 Code Status: Full Family Communication: None at bedside today. Disposition: DC SNF pending bed availability and  insurance approval.  Consultants:  None  Procedures:  None  Antimicrobials:  IV ceftriaxone-discontinued 1/25 Keflex 1/25 >   Subjective: Patient denies complaints.  No pain reported.  Discussed with RN, no acute issues noted.  ROS: As above, otherwise negative.  Objective:  Vitals:   05/30/18 1526 05/30/18 2053 05/31/18 0455 05/31/18 1454  BP: 122/78 (!) 102/59 122/85 118/75  Pulse: 77 95 68 73  Resp: _0 Temp: 97.8 F (36.6 C) 98.1 F (36.7 C) 98.5 F (36.9 C) 98.9 F (37.2 C)  TempSrc: Oral Oral Oral Oral  SpO2: 100% 100% 100% 100%  Weight:      Height:        Examination:  General exam: Pleasant middle-aged female, moderately built and overweight lying comfortably supine in bed. Respiratory system: Clear to auscultation.  No increased work of breathing. Cardiovascular system: S1 & S2 heard, RRR. No JVD, murmurs, rubs, gallops or clicks.  Stable. Gastrointestinal system: Abdomen is nondistended, soft and nontender. No organomegaly or masses felt. Normal bowel sounds heard.  Stable Central nervous system: Alert and oriented x2. No focal neurological deficits. Extremities: Symmetric 5 x 5 power in upper extremities and at least grade 4 x 5 in lower extremities.  Left lower extremity with diffuse swelling from thigh down to the leg, 1+ pitting edema-seems to be decreasing.  Bilateral knees with TKR scars but no acute findings. Skin: No rashes, lesions or ulcers Psychiatry: Judgement and insight appear impaired. Mood & affect appropriate.     Data Reviewed: I have personally reviewed following labs and imaging studies  CBC: Recent Labs  Lab 05/27/18 2305 05/28/18 0205 05/28/18 0807 05/29/18 0150  WBC 17.9*  --  14.2* 9.6  NEUTROABS 15.7*  --  10.4*  --   HGB 14.8 16.0* 13.0 13.2  HCT 46.9* 47.0* 42.4 41.7  MCV 83.9  --  84.8 83.6  PLT 318  --  265 903   Basic Metabolic Panel: Recent Labs  Lab 05/27/18 2321 05/28/18 0205 05/28/18 0807  05/29/18 0150 05/30/18 0600 05/31/18 0500  NA 135 140 140 138 137 136  K 3.6 3.1* 3.6 3.2* 3.4* 4.2  CL 96*  --  106 107 106 103  CO2 22  --  20* _1 GLUCOSE 557*  --  105* 180* 114* 86  BUN 19  --  _2 CREATININE 1.46*  --  0.94 0.82 0.96 0.92  CALCIUM 9.6  --  9.1 8.7* 9.4 9.0  MG  --   --  1.4* 2.1  --   --    Liver Function Tests: Recent Labs  Lab 05/27/18 2321 05/28/18 0807  AST 25 20  ALT 18 13  ALKPHOS 96 83  BILITOT 0.9 0.4  PROT 7.9 6.4*  ALBUMIN 3.3* 2.9*   Coagulation Profile: Recent Labs  Lab 05/28/18 0807  INR 1.05   Cardiac Enzymes: Recent Labs  Lab 05/28/18 0807  TROPONINI <0.03   HbA1C: No results for input(s): HGBA1C in the last 72 hours. CBG: Recent Labs  Lab 05/31/18 0820 05/31/18 0902 05/31/18 0915 05/31/18 1209 05/31/18 1654  GLUCAP 48* 49* 95 177* 109*    Recent Results (from the  past 240 hour(s))  Blood Culture (routine x 2)     Status: None (Preliminary result)   Collection Time: 05/28/18  4:26 AM  Result Value Ref Range Status   Specimen Description BLOOD RIGHT HAND  Final   Special Requests   Final    BOTTLES DRAWN AEROBIC ONLY Blood Culture results may not be optimal due to an inadequate volume of blood received in culture bottles   Culture   Final    NO GROWTH 3 DAYS Performed at [facility], [place] [address], [place], [place] [address]    Report Status PENDING  Incomplete  Blood Culture (routine x 2)     Status: None (Preliminary result)   Collection Time: 05/28/18  8:27 AM  Result Value Ref Range Status   Specimen Description BLOOD BLOOD LEFT HAND  Final   Special Requests   Final    BOTTLES DRAWN AEROBIC ONLY Blood Culture adequate volume   Culture   Final    NO GROWTH 3 DAYS Performed at [facility], [place] [address], [place], [place] [address]    Report Status PENDING  Incomplete         Radiology Studies: No results found.      Scheduled Meds: . allopurinol  100 mg Oral  BID  . apixaban  10 mg Oral BID   Followed by  . [START ON 06/04/2018] apixaban  5 mg Oral BID  . atorvastatin  80 mg Oral q1800  . feeding supplement (GLUCERNA SHAKE)  237 mL Oral TID BM  . gabapentin  300 mg Oral BID  . insulin aspart  0-5 Units Subcutaneous QHS  . insulin aspart  0-9 Units Subcutaneous TID WC  . insulin glargine  35 Units Subcutaneous Daily  . metoprolol succinate  50 mg Oral Daily  . multivitamin with minerals  1 tablet Oral Daily  . pantoprazole  40 mg Oral Daily   Continuous Infusions: . cefTRIAXone (ROCEPHIN)  IV 1 g (05/31/18 0534)     LOS: 3 days     Vernell Leep, MD, FACP, [facility]. [facility]  To contact the attending provider between 7A-7P or the covering provider during after hours 7P-7A, please log into the web site [email or web address] and access using universal [place] password for that web site. If you do not have the password, please call the hospital operator.  05/31/2018, 5:35 PM

## 2018-05-31 NOTE — Plan of Care (Signed)

## 2018-06-01 LAB — GLUCOSE, CAPILLARY
Glucose-Capillary: 92 mg/dL (ref 70–99)
Glucose-Capillary: 142 mg/dL — ABNORMAL HIGH (ref 70–99)
Glucose-Capillary: 169 mg/dL — ABNORMAL HIGH (ref 70–99)
Glucose-Capillary: 125 mg/dL — ABNORMAL HIGH (ref 70–99)

## 2018-06-01 MED ORDER — BISACODYL 5 MG PO TBEC
5.0000 mg | DELAYED_RELEASE_TABLET | Freq: Every day | ORAL | Status: DC | PRN
  Administered 2018-06-01 – 2018-06-02 (×2): 5 mg via ORAL
  Filled 2018-06-01 (×2): qty 1

## 2018-06-01 MED ORDER — CEPHALEXIN 500 MG PO CAPS
500.0000 mg | ORAL_CAPSULE | Freq: Two times a day (BID) | ORAL | Status: DC
  Administered 2018-06-01 – 2018-06-02 (×3): 500 mg via ORAL
  Filled 2018-06-01 (×3): qty 1

## 2018-06-01 NOTE — Progress Notes (Signed)
PROGRESS NOTE   Alyssa Haley  EHO:[id number]    DOB: [birth year]    DOA: 05/27/2018  PCP: [name], MD   I have briefly reviewed patients previous medical records in [facility].  Brief Narrative:  68 year old female with PMH of DM 2/IDDM, HTN, HLD, CAD status post PCI, GERD and gout who presented to [facility] ED on 05/27/2018 due to 3 to 4 days history of generalized weakness and fall at home.  She reported dysuria and several days history of left lower extremity swelling, intermittent bilateral hip and knee pains.  She was admitted for sepsis likely due to UTI, uncontrolled type II DM, acute renal failure and extensive left lower extremity DVT.  Clinically improved.  Medically stable for DC to SNF pending bed availability and insurance approval.  Assessment & Plan:   Principal Problem:   Sepsis ([place]) Active Problems:   CAD (coronary artery disease)   Essential hypertension   Uncontrolled type 2 diabetes mellitus with hyperglycemia ([place])   Gout   Acute lower UTI   Swelling of joint of left knee   Sepsis secondary to UTI ([place])   Sepsis due to UTI: Met sepsis criteria on admission.  Treated per protocol with IV fluids and started empirically on IV ceftriaxone, continue.  Sepsis physiology has resolved.  Blood cultures x2: Negative to date.  Unfortunately it appears that urine culture was never sent from ED and would not be helpful now that she has been on antibiotics.  Continue empiric treatment.  Completed 5 days of IV ceftriaxone.  Changed to Keflex to complete total 7 days course. Stable  Uncontrolled type II DM/IDDM with hyperglycemia: Presented with blood sugar of 557 but no DKA.  Briefly on IV insulin, improved glycemic control, discontinued overnight of admission.  Transitioned to Lantus 60 units daily (on Toujeo 70 units daily at home) and added NovoLog SSI.  Monitor closely and adjust insulins as needed.  A1c 11.1 suggests very poor outpatient control.  Diabetes  coordinator input appreciated.  Question of compliance to diet and medications.  Due to hypoglycemic episode, had reduced Lantus to 50 daily but again had CBGs in the 40s on 1/25 morning, reduced Lantus further to 35 units daily (half of her home dose).  Monitor closely.  CBGs controlled.  No recurrence of hypoglycemia.  Elevated lactate: Secondary to UTI sepsis.  Treated with IV fluids.  Lactate has improved from greater 5.6-2.7.    Left lower extremity extensive DVT Patient not very mobile.  No recent long distance travel history.  No history of prior VTE.  Left lower extremity venous Doppler confirms extensive DVT as below.  Discussed extensively with patient/daughter regarding anticoagulation, risks and benefits, options including warfarin/Lovenox bridge, Eliquis/Xarelto, Pradaxa, risks and benefits of each.  They decided to go along with Eliquis, initiated per pharmacy.  Duration of anticoagulation at least 3 months.  X-rays of both hips and left knee without acute findings.  Planing of right lower extremity pain and hence performed venous Doppler and negative for DVT in right lower extremity.  Some of her lower extremity pains probably neuropathic, resumed gabapentin.  Pain better controlled now.  Placed left lower extremity compressive stockings to address edema.  Acute renal failure: Creatinine was normal in August 2018.  Presented with creatinine of 1.4.  Likely due to dehydration.  Resolved after IV fluid hydration.  Follow BMP periodically.  ARB temporarily held.  Essential hypertension: Had soft blood pressures in the ED, temporarily holding off on ARB and amlodipine.  Continue beta-blockers and PRN IV hydralazine.  Controlled.  CAD status post PCI: Asymptomatic of chest pain.  Troponin x1: Negative.  Continue statins and beta-blockers.  Gout: No acute gouty flare.  Continue allopurinol.  Hyperlipidemia:  Hypokalemia Replaced.  Hypomagnesemia Replaced.  Confusion Suspect  patient has underlying dementia and current mental status possibly at baseline.  Need to verify with family.   DVT prophylaxis: Initiated Eliquis 2/22 Code Status: Full Family Communication: None at bedside today. Disposition: DC SNF pending bed availability and insurance approval.  Consultants:  None  Procedures:  None  Antimicrobials:  IV ceftriaxone-discontinued 1/25 Keflex 1/25 >   Subjective: No complaints reported.  No acute issues reported by RN.  As per RN, baseline confusion.  ROS: As above, otherwise negative.  Objective:  Vitals:   05/31/18 0455 05/31/18 1454 05/31/18 2126 06/01/18 0556  BP: 122/85 118/75 (!) 101/58 110/67  Pulse: 68 73 84 78  Resp: '16 16 16 16  ' Temp: 98.5 F (36.9 C) 98.9 F (37.2 C) 98.6 F (37 C) 99.3 F (37.4 C)  TempSrc: Oral Oral Oral Oral  SpO2: 100% 100% 99% 100%  Weight:      Height:        Examination:  General exam: Pleasant middle-aged female, moderately built and overweight lying comfortably supine in bed. Respiratory system: Clear to auscultation.  No increased work of breathing. Cardiovascular system: S1 & S2 heard, RRR. No JVD, murmurs, rubs, gallops or clicks.  Stable. Gastrointestinal system: Abdomen is nondistended, soft and nontender. No organomegaly or masses felt. Normal bowel sounds heard.  Stable Central nervous system: Alert and oriented x2. No focal neurological deficits. Extremities: Symmetric 5 x 5 power in upper extremities and at least grade 4 x 5 in lower extremities.  Persistent diffuse left lower extremity edema up to the groin, not significantly better than on admission but no other acute findings.  Trace right leg edema.  Bilateral knees with TKR scars but no acute findings. Skin: No rashes, lesions or ulcers Psychiatry: Judgement and insight appear impaired. Mood & affect appropriate.     Data Reviewed: I have personally reviewed following labs and imaging studies  CBC: Recent Labs  Lab  05/27/18 2305 05/28/18 0205 05/28/18 0807 05/29/18 0150  WBC 17.9*  --  14.2* 9.6  NEUTROABS 15.7*  --  10.4*  --   HGB 14.8 16.0* 13.0 13.2  HCT 46.9* 47.0* 42.4 41.7  MCV 83.9  --  84.8 83.6  PLT 318  --  265 563   Basic Metabolic Panel: Recent Labs  Lab 05/27/18 2321 05/28/18 0205 05/28/18 0807 05/29/18 0150 05/30/18 0600 05/31/18 0500  NA 135 140 140 138 137 136  K 3.6 3.1* 3.6 3.2* 3.4* 4.2  CL 96*  --  106 107 106 103  CO2 22  --  20* '23 24 25  ' GLUCOSE 557*  --  105* 180* 114* 86  BUN 19  --  '16 14 12 13  ' CREATININE 1.46*  --  0.94 0.82 0.96 0.92  CALCIUM 9.6  --  9.1 8.7* 9.4 9.0  MG  --   --  1.4* 2.1  --   --    Liver Function Tests: Recent Labs  Lab 05/27/18 2321 05/28/18 0807  AST 25 20  ALT 18 13  ALKPHOS 96 83  BILITOT 0.9 0.4  PROT 7.9 6.4*  ALBUMIN 3.3* 2.9*   Coagulation Profile: Recent Labs  Lab 05/28/18 0807  INR 1.05   Cardiac Enzymes: Recent Labs  Lab 05/28/18 0807  TROPONINI <0.03   HbA1C: No results for input(s): HGBA1C in the last 72 hours. CBG: Recent Labs  Lab 05/31/18 0915 05/31/18 1209 05/31/18 1654 05/31/18 2123 06/01/18 0752  GLUCAP 95 177* 109* 128* 92    Recent Results (from the past 240 hour(s))  Blood Culture (routine x 2)     Status: None (Preliminary result)   Collection Time: 05/28/18  4:26 AM  Result Value Ref Range Status   Specimen Description BLOOD RIGHT HAND  Final   Special Requests   Final    BOTTLES DRAWN AEROBIC ONLY Blood Culture results may not be optimal due to an inadequate volume of blood received in culture bottles   Culture   Final    NO GROWTH 3 DAYS Performed at [facility], [place] [address], [place], [place] [address]    Report Status PENDING  Incomplete  Blood Culture (routine x 2)     Status: None (Preliminary result)   Collection Time: 05/28/18  8:27 AM  Result Value Ref Range Status   Specimen Description BLOOD BLOOD LEFT HAND  Final   Special Requests   Final     BOTTLES DRAWN AEROBIC ONLY Blood Culture adequate volume   Culture   Final    NO GROWTH 3 DAYS Performed at [facility], [place] [address], [place], [place] [address]    Report Status PENDING  Incomplete         Radiology Studies: No results found.      Scheduled Meds: . allopurinol  100 mg Oral BID  . apixaban  10 mg Oral BID   Followed by  . [START ON 06/04/2018] apixaban  5 mg Oral BID  . atorvastatin  80 mg Oral q1800  . feeding supplement (GLUCERNA SHAKE)  237 mL Oral TID BM  . gabapentin  300 mg Oral BID  . insulin aspart  0-5 Units Subcutaneous QHS  . insulin aspart  0-9 Units Subcutaneous TID WC  . insulin glargine  35 Units Subcutaneous Daily  . metoprolol succinate  50 mg Oral Daily  . multivitamin with minerals  1 tablet Oral Daily  . pantoprazole  40 mg Oral Daily   Continuous Infusions: . cefTRIAXone (ROCEPHIN)  IV 1 g (06/01/18 0631)     LOS: 4 days     Vernell Leep, MD, FACP, [facility]. [facility]  To contact the attending provider between 7A-7P or the covering provider during after hours 7P-7A, please log into the web site [email or web address] and access using universal [place] password for that web site. If you do not have the password, please call the hospital operator.  06/01/2018, 7:55 AM

## 2018-06-01 NOTE — Plan of Care (Signed)
  Problem: Clinical Measurements: Goal: Diagnostic test results will improve Outcome: Progressing Goal: Cardiovascular complication will be avoided Outcome: Progressing   Problem: Activity: Goal: Risk for activity intolerance will decrease Outcome: Progressing   Problem: Skin Integrity: Goal: Risk for impaired skin integrity will decrease Outcome: Progressing

## 2018-06-02 DIAGNOSIS — I10 Essential (primary) hypertension: Secondary | ICD-10-CM

## 2018-06-02 LAB — CULTURE, BLOOD (ROUTINE X 2)
Culture: NO GROWTH
Culture: NO GROWTH
Special Requests: ADEQUATE

## 2018-06-02 LAB — GLUCOSE, CAPILLARY
Glucose-Capillary: 120 mg/dL — ABNORMAL HIGH (ref 70–99)
Glucose-Capillary: 232 mg/dL — ABNORMAL HIGH (ref 70–99)
Glucose-Capillary: 253 mg/dL — ABNORMAL HIGH (ref 70–99)

## 2018-06-02 MED ORDER — ADULT MULTIVITAMIN W/MINERALS CH: 1.0000 | ORAL_TABLET | Freq: Every day | ORAL | Status: AC

## 2018-06-02 MED ORDER — INSULIN ASPART 100 UNIT/ML ~~LOC~~ SOLN: 0.0000 [IU] | Freq: Three times a day (TID) | SUBCUTANEOUS | Status: AC

## 2018-06-02 MED ORDER — GABAPENTIN 300 MG PO CAPS: 300.0000 mg | ORAL_CAPSULE | Freq: Two times a day (BID) | ORAL | Status: AC

## 2018-06-02 MED ORDER — OXYCODONE-ACETAMINOPHEN 10-325 MG PO TABS: 1.0000 | ORAL_TABLET | Freq: Two times a day (BID) | ORAL | 0 refills | Status: DC | PRN

## 2018-06-02 MED ORDER — GLUCERNA SHAKE PO LIQD: 237.0000 mL | Freq: Three times a day (TID) | ORAL | Status: AC

## 2018-06-02 MED ORDER — POLYETHYLENE GLYCOL 3350 17 G PO PACK: 17.0000 g | PACK | Freq: Every day | ORAL | Status: AC

## 2018-06-02 MED ORDER — APIXABAN 5 MG PO TABS: 10.0000 mg | ORAL_TABLET | Freq: Two times a day (BID) | ORAL | Status: DC

## 2018-06-02 MED ORDER — APIXABAN 5 MG PO TABS: 5.0000 mg | ORAL_TABLET | Freq: Two times a day (BID) | ORAL | Status: AC

## 2018-06-02 MED ORDER — INSULIN GLARGINE (1 UNIT DIAL) 300 UNIT/ML ~~LOC~~ SOPN: 35.0000 [IU] | PEN_INJECTOR | Freq: Every day | SUBCUTANEOUS | Status: AC

## 2018-06-02 NOTE — Social Work (Signed)
Spoke with pt daughter Ivar Drape via telephone, her preferred SNF is [facility] in [place]. Pt has met 3 midnight medically necessary stay, PASSR received and bed available at preferred SNF.  Will page MD for d/c summary.  Westley Hummer, MSW, [place] Work [phone]

## 2018-06-02 NOTE — Progress Notes (Signed)
PROGRESS NOTE   Alyssa Haley  NUU:[id number]    DOB: May 28, 1950    DOA: 05/27/2018  PCP: [name], MD   I have briefly reviewed patients previous medical records in [facility].  Brief Narrative:  68 year old female with PMH of DM 2/IDDM, HTN, HLD, CAD status post PCI, GERD and gout who presented to [facility] ED on 05/27/2018 due to 3 to 4 days history of generalized weakness and fall at home.  She reported dysuria and several days history of left lower extremity swelling, intermittent bilateral hip and knee pains.  She was admitted for sepsis likely due to UTI, uncontrolled type II DM, acute renal failure and extensive left lower extremity DVT.  Clinically improved.  Medically stable for DC to SNF pending bed availability and insurance approval.  Social work aware.  Assessment & Plan:   Principal Problem:   Sepsis ([place]) Active Problems:   CAD (coronary artery disease)   Essential hypertension   Uncontrolled type 2 diabetes mellitus with hyperglycemia ([facility])   Gout   Acute lower UTI   Swelling of joint of left knee   Sepsis secondary to UTI ([place])   Sepsis due to UTI: Met sepsis criteria on admission.  Treated per protocol with IV fluids and started empirically on IV ceftriaxone, continue.  Sepsis physiology has resolved.  Blood cultures x2: Negative to date.  Unfortunately it appears that urine culture was never sent from ED and would not be helpful now that she has been on antibiotics.  Continue empiric treatment.  Completed 5 days of IV ceftriaxone.  Changed to Keflex to complete total 7 days course. Stable  Uncontrolled type II DM/IDDM with hyperglycemia: Presented with blood sugar of 557 but no DKA.  Briefly on IV insulin, improved glycemic control, discontinued overnight of admission.  Transitioned to Lantus 60 units daily (on Toujeo 70 units daily at home) and added NovoLog SSI.  Monitor closely and adjust insulins as needed.  A1c 11.1 suggests very poor outpatient  control.  Diabetes coordinator input appreciated.  Question of compliance to diet and medications.  Due to hypoglycemic episode, had reduced Lantus to 50 daily but again had CBGs in the 40s on 1/25 morning, reduced Lantus further to 35 units daily (half of her home dose).  Monitor closely.  CBGs controlled.  No recurrence of hypoglycemia.  Continue current doses of insulins at discharge.  Elevated lactate: Secondary to UTI sepsis.  Treated with IV fluids.  Lactate has improved from greater 5.6-2.7.    Left lower extremity extensive DVT Patient not very mobile.  No recent long distance travel history.  No history of prior VTE.  Left lower extremity venous Doppler confirms extensive DVT as below.  Discussed extensively with patient/daughter regarding anticoagulation, risks and benefits, options including warfarin/Lovenox bridge, Eliquis/Xarelto, Pradaxa, risks and benefits of each.  They decided to go along with Eliquis, initiated per pharmacy.  Duration of anticoagulation at least 3 months.  X-rays of both hips and left knee without acute findings.  Planing of right lower extremity pain and hence performed venous Doppler and negative for DVT in right lower extremity.  Some of her lower extremity pains probably neuropathic, resumed gabapentin.  Pain better controlled now.  Placed left lower extremity compressive stockings to address edema.  Acute renal failure: Creatinine was normal in August 2018.  Presented with creatinine of 1.4.  Likely due to dehydration.  Resolved after IV fluid hydration.  Follow BMP periodically.  ARB temporarily held.  Essential hypertension: Had soft  blood pressures in the ED, temporarily holding off on ARB and amlodipine.  Continue beta-blockers and PRN IV hydralazine.  Controlled.  CAD status post PCI: Asymptomatic of chest pain.  Troponin x1: Negative.  Continue statins and beta-blockers.  Gout: No acute gouty flare.  Continue  allopurinol.  Hyperlipidemia:  Hypokalemia Replaced.  Hypomagnesemia Replaced.  Confusion Suspect patient has underlying dementia and current mental status likely at baseline.  Need to verify with family.   DVT prophylaxis: Initiated Eliquis 2/22 Code Status: Full Family Communication: None at bedside today. Disposition: DC SNF pending bed availability and insurance approval.  Consultants:  None  Procedures:  None  Antimicrobials:  IV ceftriaxone-discontinued 1/25 Keflex 1/25 >   Subjective: Alert, oriented to person, place and partly to time.  Aware that she is in the hospital due to left leg swelling from a clot.  Denies pain or any other complaints.  As per RN, no acute issues noted.  ROS: As above, otherwise negative.  Objective:  Vitals:   06/01/18 1439 06/01/18 2111 06/01/18 2206 06/02/18 0647  BP: 108/71  114/69 (!) 126/94  Pulse: 97  86 87  Resp: 17  20   Temp: 99 F (37.2 C) 98.8 F (37.1 C)  98.8 F (37.1 C)  TempSrc: Oral   Oral  SpO2: 100%  100% 100%  Weight:      Height:        Examination:  General exam: Pleasant middle-aged female, moderately built and overweight lying comfortably supine in bed.  Stable Respiratory system: Clear to auscultation.  No increased work of breathing.  Stable Cardiovascular system: S1 & S2 heard, RRR. No JVD, murmurs, rubs, gallops or clicks.  Stable. Gastrointestinal system: Abdomen is nondistended, soft and nontender. No organomegaly or masses felt. Normal bowel sounds heard.  Stable Central nervous system: Mental status as noted above. No focal neurological deficits. Extremities: Symmetric 5 x 5 power in upper extremities and at least grade 4 x 5 in lower extremities.  Persistent diffuse left lower extremity edema up to the groin, now has thigh-high left lower extremity compression stocking and edema may be slightly better than yesterday.  Trace right leg edema.  Bilateral knees with TKR scars but no acute  findings. Skin: No rashes, lesions or ulcers Psychiatry: Judgement and insight appear impaired. Mood & affect appropriate.     Data Reviewed: I have personally reviewed following labs and imaging studies  CBC: Recent Labs  Lab 05/27/18 2305 05/28/18 0205 05/28/18 0807 05/29/18 0150  WBC 17.9*  --  14.2* 9.6  NEUTROABS 15.7*  --  10.4*  --   HGB 14.8 16.0* 13.0 13.2  HCT 46.9* 47.0* 42.4 41.7  MCV 83.9  --  84.8 83.6  PLT 318  --  265 275   Basic Metabolic Panel: Recent Labs  Lab 05/27/18 2321 05/28/18 0205 05/28/18 0807 05/29/18 0150 05/30/18 0600 05/31/18 0500  NA 135 140 140 138 137 136  K 3.6 3.1* 3.6 3.2* 3.4* 4.2  CL 96*  --  106 107 106 103  CO2 22  --  20* _0 GLUCOSE 557*  --  105* 180* 114* 86  BUN 19  --  _1 CREATININE 1.46*  --  0.94 0.82 0.96 0.92  CALCIUM 9.6  --  9.1 8.7* 9.4 9.0  MG  --   --  1.4* 2.1  --   --    Liver Function Tests: Recent Labs  Lab 05/27/18 2321 05/28/18 1700  AST 25 20  ALT 18 13  ALKPHOS 96 83  BILITOT 0.9 0.4  PROT 7.9 6.4*  ALBUMIN 3.3* 2.9*   Coagulation Profile: Recent Labs  Lab 05/28/18 0807  INR 1.05   Cardiac Enzymes: Recent Labs  Lab 05/28/18 0807  TROPONINI <0.03   HbA1C: No results for input(s): HGBA1C in the last 72 hours. CBG: Recent Labs  Lab 06/01/18 0752 06/01/18 1127 06/01/18 1628 06/01/18 2109 06/02/18 0824  GLUCAP 92 142* 169* 125* 120*    Recent Results (from the past 240 hour(s))  Blood Culture (routine x 2)     Status: None (Preliminary result)   Collection Time: 05/28/18  4:26 AM  Result Value Ref Range Status   Specimen Description BLOOD RIGHT HAND  Final   Special Requests   Final    BOTTLES DRAWN AEROBIC ONLY Blood Culture results may not be optimal due to an inadequate volume of blood received in culture bottles   Culture   Final    NO GROWTH 4 DAYS Performed at [facility], [place] [address], [place], [place] [address]    Report Status PENDING   Incomplete  Blood Culture (routine x 2)     Status: None (Preliminary result)   Collection Time: 05/28/18  8:27 AM  Result Value Ref Range Status   Specimen Description BLOOD BLOOD LEFT HAND  Final   Special Requests   Final    BOTTLES DRAWN AEROBIC ONLY Blood Culture adequate volume   Culture   Final    NO GROWTH 4 DAYS Performed at [facility], [place] [address], [place], [place] [address]    Report Status PENDING  Incomplete         Radiology Studies: No results found.      Scheduled Meds: . allopurinol  100 mg Oral BID  . apixaban  10 mg Oral BID   Followed by  . [START ON 06/04/2018] apixaban  5 mg Oral BID  . atorvastatin  80 mg Oral q1800  . cephALEXin  500 mg Oral Q12H  . feeding supplement (GLUCERNA SHAKE)  237 mL Oral TID BM  . gabapentin  300 mg Oral BID  . insulin aspart  0-5 Units Subcutaneous QHS  . insulin aspart  0-9 Units Subcutaneous TID WC  . insulin glargine  35 Units Subcutaneous Daily  . metoprolol succinate  50 mg Oral Daily  . multivitamin with minerals  1 tablet Oral Daily  . pantoprazole  40 mg Oral Daily   Continuous Infusions:    LOS: 5 days     Vernell Leep, MD, FACP, [facility]. [facility]  To contact the attending provider between 7A-7P or the covering provider during after hours 7P-7A, please log into the web site [email or web address] and access using universal [place] password for that web site. If you do not have the password, please call the hospital operator.  06/02/2018, 9:22 AM

## 2018-06-02 NOTE — Social Work (Signed)
Clinical Social Worker facilitated patient discharge including contacting patient family and facility to confirm patient discharge plans.  Clinical information faxed to facility and family agreeable with plan.  CSW arranged ambulance transport via [facility] to [facility] for 3pm. RN to call [phone]  with report prior to discharge.  Clinical Social Worker will sign off for now as social work intervention is no longer needed. Please consult [place] again if new need arises.  Octavio Graves, MSW, [facility] Clinical Social Worker [phone]

## 2018-06-02 NOTE — Clinical Social Work Placement (Signed)
   CLINICAL SOCIAL WORK PLACEMENT  NOTE [facility]  Date:  06/02/2018  Patient Details  Name: Alyssa Haley MRN: [id number] Date of Birth: 05-25-1950  Clinical Social Work is seeking post-discharge placement for this patient at the Skilled  Nursing Facility level of care (*CSW will initial, date and re-position this form in  chart as items are completed):  Yes   Patient/family provided with [facility] Clinical Social Work Department's list of facilities offering this level of care within the geographic area requested by the patient (or if unable, by the patient's family).  Yes   Patient/family informed of their freedom to choose among providers that offer the needed level of care, that participate in Medicare, Medicaid or managed care program needed by the patient, have an available bed and are willing to accept the patient.  Yes   Patient/family informed of [facility]'s ownership interest in [facility] and [facility], as well as of the fact that they are under no obligation to receive care at these facilities.  PASRR submitted to EDS on       PASRR number received on       Existing PASRR number confirmed on 05/30/18     FL2 transmitted to all facilities in geographic area requested by pt/family on 05/30/18     FL2 transmitted to all facilities within larger geographic area on       Patient informed that his/her managed care company has contracts with or will negotiate with certain facilities, including the following:        Yes   Patient/family informed of bed offers received.  Patient [facility]     Physician recommends and patient chooses bed at      [facility] to [facility] on 06/02/18.  Patient to be transferred to facility by [facility]     Patient family notified on 06/02/18 of transfer.  Name of family member notified:  pt daughter Rayetta Humphreyranajah     PHYSICIAN Please prepare priority discharge  summary, including medications, Please prepare prescriptions     Additional Comment:    _______________________________________________ Doy HutchingIsabel H Nadalie Laughner, LCSWA 06/02/2018, 9:49 AM

## 2018-06-02 NOTE — Discharge Instructions (Signed)
Information on my medicine - ELIQUIS (apixaban)  This medication education was reviewed with me or my healthcare representative as part of my discharge preparation.   Why was Eliquis prescribed for you? 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  What do You need to know about Eliquis ? The starting dose is 10 mg (two 5 mg tablets) taken TWICE daily for the FIRST SEVEN (7) DAYS, then on 06/04/2018 the dose is reduced to ONE 5 mg tablet taken TWICE daily.  Eliquis may be taken with or without food.   Try to take the dose about the same time in the morning and in the evening. If you have difficulty swallowing the tablet whole please discuss with your pharmacist how to take the medication safely.  Take Eliquis exactly as prescribed and DO NOT stop taking Eliquis without talking to the doctor who prescribed the medication.  Stopping may increase your risk of developing a new blood clot.  Refill your prescription before you run out.  After discharge, you should have regular check-up appointments with your healthcare provider that is prescribing your Eliquis.    What do you do if you miss a dose? If a dose of ELIQUIS is not taken at the scheduled time, take it as soon as possible on the same day and twice-daily administration should be resumed. The dose should not be doubled to make up for a missed dose.  Important Safety Information A possible side effect of Eliquis is bleeding. You should call your healthcare provider right away if you experience any of the following: ? Bleeding from an injury or your nose that does not stop. ? Unusual colored urine (red or dark brown) or unusual colored stools (red or black). ? Unusual bruising for unknown reasons. ? A serious fall or if you hit your head (even if there is no bleeding).  Some  medicines may interact with Eliquis and might increase your risk of bleeding or clotting while on Eliquis. To help avoid this, consult your healthcare provider or pharmacist prior to using any new prescription or non-prescription medications, including herbals, vitamins, non-steroidal anti-inflammatory drugs (NSAIDs) and supplements.  This website has more information on Eliquis (apixaban): http://[email or web address].com/eliquis/home    Additional discharge instructions  Please get your medications reviewed and adjusted by your Primary MD.  Please request your Primary MD to go over all Hospital Tests and Procedure/Radiological results at the follow up, please get all Hospital records sent to your Prim MD by signing hospital release before you go home.  If you had Pneumonia of Lung problems at the Hospital: Please get a 2 view Chest X ray done in 6-8 weeks after hospital discharge or sooner if instructed by your Primary MD.  If you have Congestive Heart Failure: Please call your Cardiologist or Primary MD anytime you have any of the following symptoms:  1) 3 pound weight gain in 24 hours or 5 pounds in 1 week  2) shortness of breath, with or without a dry hacking cough  3) swelling in the hands, feet or stomach  4) if you have to sleep on extra pillows at night in order to breathe  Follow cardiac low salt diet and 1.5 lit/day fluid restriction.  If you have diabetes Accuchecks 4 times/day, Once in AM empty stomach and then before each meal. Log in all  results and show them to your primary doctor at your next visit. If any glucose reading is under 80 or above 300 call your primary MD immediately.  If you have Seizure/Convulsions/Epilepsy: Please do not drive, operate heavy machinery, participate in activities at heights or participate in high speed sports until you have seen by Primary MD or a Neurologist and advised to do so again.  If you had Gastrointestinal Bleeding: Please ask your  Primary MD to check a complete blood count within one week of discharge or at your next visit. Your endoscopic/colonoscopic biopsies that are pending at the time of discharge, will also need to followed by your Primary MD.  Get Medicines reviewed and adjusted. Please take all your medications with you for your next visit with your Primary MD  Please request your Primary MD to go over all hospital tests and procedure/radiological results at the follow up, please ask your Primary MD to get all Hospital records sent to his/her office.  If you experience worsening of your admission symptoms, develop shortness of breath, life threatening emergency, suicidal or homicidal thoughts you must seek medical attention immediately by calling 911 or calling your MD immediately  if symptoms less severe.  You must read complete instructions/literature along with all the possible adverse reactions/side effects for all the Medicines you take and that have been prescribed to you. Take any new Medicines after you have completely understood and accpet all the possible adverse reactions/side effects.   Do not drive or operate heavy machinery when taking Pain medications.   Do not take more than prescribed Pain, Sleep and Anxiety Medications  Special Instructions: If you have smoked or chewed Tobacco  in the last 2 yrs please stop smoking, stop any regular Alcohol  and or any Recreational drug use.  Wear Seat belts while driving.  Please note You were cared for by a hospitalist during your hospital stay. If you have any questions about your discharge medications or the care you received while you were in the hospital after you are discharged, you can call the unit and asked to speak with the hospitalist on call if the hospitalist that took care of you is not available. Once you are discharged, your primary care physician will handle any further medical issues. Please note that NO REFILLS for any discharge medications  will be authorized once you are discharged, as it is imperative that you return to your primary care physician (or establish a relationship with a primary care physician if you do not have one) for your aftercare needs so that they can reassess your need for medications and monitor your lab values.  You can reach the hospitalist office at phone [phone] or fax [phone]   If you do not have a primary care physician, you can call [phone] for a physician referral.

## 2018-06-02 NOTE — Progress Notes (Signed)
Report called to Alyssa Haley at [facility]. Patient is ready to discharge, awaiting transport.

## 2018-06-02 NOTE — Discharge Summary (Signed)
Physician Discharge Summary  Jemmie Ledgerwood WEX:[id number] DOB: 1951-02-27  PCP: [name], MD  Admit date: 05/27/2018 Discharge date: 06/02/2018  Recommendations for Outpatient Follow-up:  1. MD at SNF in 2 to 3 days with repeat labs (CBC & BMP). 2. Dr. [name], PCP upon discharge from SNF.  Home Health: Patient being discharged to [facility], [place] Equipment/Devices: TBD at [facility].  Discharge Condition: Improved and stable. CODE STATUS: Full Diet recommendation: Heart healthy & diabetic diet.  Discharge Diagnoses:  Principal Problem:   Sepsis ([place]) Active Problems:   CAD (coronary artery disease)   Essential hypertension   Uncontrolled type 2 diabetes mellitus with hyperglycemia (HCC)   Gout   Acute lower UTI   Swelling of joint of left knee   Sepsis secondary to UTI [facility])   Brief Summary: 68 year old female with PMH of DM 2/IDDM, HTN, HLD, CAD status post PCI, GERD and gout who presented to [facility] ED on 05/27/2018 due to 3 to 4 days history of generalized weakness and fall at home.  She reported dysuria and several days history of left lower extremity swelling, intermittent bilateral hip and knee pains.  She was admitted for sepsis likely due to UTI, uncontrolled type II DM, acute renal failure and extensive left lower extremity DVT.   Assessment & Plan:  Sepsis due to UTI: Met sepsis criteria on admission.  Treated per protocol with IV fluids and started empirically on IV ceftriaxone.  Sepsis physiology has resolved.  Blood cultures x2: Negative to date.  Unfortunately it appears that urine culture was never sent from ED and would not be helpful now that she has been on antibiotics.  She has completed 7 days of antibiotic therapy and no further antibiotics at DC.  If she has recurrence of UTI symptoms, consider repeating urine microscopy and culture prior to starting antibiotics.  Uncontrolled type II DM/IDDM with hyperglycemia: Presented with  blood sugar of 557 but no DKA.  Briefly on IV insulin, improved glycemic control, discontinued overnight of admission.  Transitioned to Lantus 60 units daily (on Toujeo 70 units daily at home) and added NovoLog SSI. A1c 11.1 suggests very poor outpatient control.  Diabetes coordinator input appreciated.  Question of compliance to diet and medications.  Due to hypoglycemic episode, had reduced Lantus to 50 daily but again had CBGs in the 40s on 1/25 morning, reduced Lantus further to 35 units daily (half of her home dose).  No recurrence of hypoglycemia.    At discharge, continue Toujeo at 35 units daily and NovoLog SSI.  Discontinued semaglutide.  Close outpatient follow-up and adjust medications as needed.  Elevated lactate: Secondary to UTI sepsis.  Treated with IV fluids.  Lactate has improved from greater 5.6-2.7.    Left lower extremity extensive DVT Patient not very mobile.  No recent long distance travel history.  No history of prior VTE.  Left lower extremity venous Doppler confirms extensive DVT as below.  Discussed extensively with patient/daughter regarding anticoagulation, risks and benefits, options including warfarin/Lovenox bridge, Eliquis/Xarelto, Pradaxa, risks and benefits of each.  They decided to go along with Eliquis, initiated per pharmacy.  Duration of anticoagulation at least 3 months.  X-rays of both hips and left knee without acute findings.  Complained of right lower extremity pain and hence performed venous Doppler and negative for DVT in right lower extremity.  Some of her lower extremity pains probably neuropathic, resumed gabapentin.  Pain better controlled now.  Placed left lower extremity compressive stockings to address edema.  May consider outpatient hematology consultation for further evaluation for hypercoagulable state.  Acute renal failure: Creatinine was normal in August 2018.  Presented with creatinine of 1.4.  Likely due to dehydration.  Resolved after IV fluid  hydration.  Follow BMP periodically.  ARB discontinued.  Essential hypertension: Had soft blood pressures in the ED.  Discontinued ARB and amlodipine. Continue beta-blockers alone and blood pressures reasonably controlled.  CAD status post PCI: Asymptomatic of chest pain.  Troponin x1: Negative.  Continue statins and beta-blockers.  Gout: No acute gouty flare.  Continue allopurinol.  Hyperlipidemia: Continue statins.  Hypokalemia Replaced.  Hypomagnesemia Replaced.  Confusion Suspect patient has underlying dementia and current mental status likely at baseline.  I discussed with patient's daughter who feels that due to patient's son's upcoming death anniversary in 06-20-22, patient may have some ongoing confusion.  She last saw her with her grandkids on Saturday in the hospital and patient was improved and closer to her baseline but does have periodic confusion.  No agitation.  Outpatient follow-up with PCP.    Consultants:  None  Procedures:  None  Discharge Instructions  Discharge Instructions    Call MD for:  difficulty breathing, headache or visual disturbances   Complete by:  As directed    Call MD for:  extreme fatigue   Complete by:  As directed    Call MD for:  persistant dizziness or light-headedness   Complete by:  As directed    Call MD for:  persistant nausea and vomiting   Complete by:  As directed    Call MD for:  severe uncontrolled pain   Complete by:  As directed    Call MD for:  temperature >100.4   Complete by:  As directed    Diet - low sodium heart healthy   Complete by:  As directed    Diet Carb Modified   Complete by:  As directed    Increase activity slowly   Complete by:  As directed        Medication List    STOP taking these medications   amLODipine 10 MG tablet Commonly known as:  NORVASC   cephALEXin 500 MG capsule Commonly known as:  KEFLEX   losartan 25 MG tablet Commonly known as:  COZAAR   OZEMPIC (1 MG/DOSE)  2 MG/1.5ML Sopn Generic drug:  Semaglutide (1 MG/DOSE)   phenazopyridine 200 MG tablet Commonly known as:  PYRIDIUM   rosuvastatin 40 MG tablet Commonly known as:  CRESTOR   zolpidem 10 MG tablet Commonly known as:  AMBIEN     TAKE these medications   allopurinol 100 MG tablet Commonly known as:  ZYLOPRIM Take 100 mg by mouth 2 (two) times daily.   apixaban 5 MG Tabs tablet Commonly known as:  ELIQUIS Take 2 tablets (10 mg total) by mouth 2 (two) times daily. Discontinue after 06/03/2018 doses.   apixaban 5 MG Tabs tablet Commonly known as:  ELIQUIS Take 1 tablet (5 mg total) by mouth 2 (two) times daily. To be started 06/04/2018. Start taking on:  June 04, 2018   atorvastatin 80 MG tablet Commonly known as:  LIPITOR Take 80 mg by mouth daily.   feeding supplement (GLUCERNA SHAKE) Liqd Take 237 mLs by mouth 3 (three) times daily between meals.   gabapentin 300 MG capsule Commonly known as:  NEURONTIN Take 1 capsule (300 mg total) by mouth 2 (two) times daily. What changed:    when to take this  reasons to take  this   insulin aspart 100 UNIT/ML injection Commonly known as:  novoLOG Inject 0-9 Units into the skin 3 (three) times daily with meals. CBG < 70: implement hypoglycemia protocol CBG 70 - 120: 0 units CBG 121 - 150: 1 unit CBG 151 - 200: 2 units CBG 201 - 250: 3 units CBG 251 - 300: 5 units CBG 301 - 350: 7 units CBG 351 - 400: 9 units CBG > 400: call MD.   Insulin Glargine (1 Unit Dial) 300 UNIT/ML Sopn Commonly known as:  TOUJEO SOLOSTAR Inject 35 Units into the skin daily. Start taking on:  June 03, 2018 What changed:  how much to take   metoprolol succinate 50 MG 24 hr tablet Commonly known as:  TOPROL-XL Take 50 mg by mouth daily.   mirtazapine 15 MG tablet Commonly known as:  REMERON Take 15 mg by mouth at bedtime as needed (sleep).   multivitamin with minerals Tabs tablet Take 1 tablet by mouth daily. Start taking on:  June 03, 2018   omeprazole 40 MG capsule Commonly known as:  PRILOSEC Take 40 mg by mouth daily.   oxyCODONE-acetaminophen 10-325 MG tablet Commonly known as:  PERCOCET Take 1 tablet by mouth every 12 (twelve) hours as needed (Moderate - Severe pain.). What changed:    when to take this  reasons to take this   polyethylene glycol packet Commonly known as:  MIRALAX Take 17 g by mouth daily.       Contact information for follow-up providers    MD at SNF. Schedule an appointment as soon as possible for a visit.   Why:  To be seen in 2 to 3 days with repeat labs (CBC & BMP).       [name], MD. Schedule an appointment as soon as possible for a visit.   Specialty:  Family Medicine Why:  Upon discharge from SNF.           Contact information for after-discharge care    [facility] .   Service:  Skilled Nursing Contact information: [place] [phone]                 Allergies  Allergen Reactions  . Penicillins Hives      Procedures/Studies: Dg Knee 1-2 Views Left  Result Date: 05/28/2018 CLINICAL DATA:  Left knee pain today EXAM: LEFT KNEE - 1-2 VIEW COMPARISON:  None. FINDINGS: Total knee arthroplasty that is well seated. Normal joint alignment. No fracture or erosion. No definite joint effusion. There is nonspecific generalized subcutaneous reticulation IMPRESSION: No acute finding. 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 Electronically Signed   By: Monte Fantasia M.D.   On: 05/28/2018 06:18   Dg Chest Port 1 View  Result Date: 05/28/2018 CLINICAL DATA:  Sepsis EXAM: PORTABLE CHEST 1 VIEW COMPARISON:  None. FINDINGS: Normal heart size. Aortic tortuosity accentuated by rightward rotation. There is no edema, consolidation, effusion, or pneumothorax. Artifact from EKG leads. Prominent degenerative endplate spurring. IMPRESSION: No acute finding.  Electronically Signed   By: Monte Fantasia M.D.   On: 05/28/2018 06:17   Dg Hip Unilat With Pelvis 2-3 Views Left  Result Date: 05/28/2018 CLINICAL DATA:  Left worse than right hip pain. Status post fall today. Initial encounter. EXAM: DG HIP (WITH OR WITHOUT PELVIS) 2-3V LEFT COMPARISON:  Plain films of the right hip 05/28/2018 FINDINGS: No acute bony or joint abnormality is seen. Mild degenerative  change is present about the hips. Atherosclerosis noted. IMPRESSION: No acute abnormality. Electronically Signed   By: Inge Rise M.D.   On: 05/28/2018 09:21   Dg Hip Unilat W Or W/o Pelvis 2-3 Views Right  Result Date: 05/28/2018 CLINICAL DATA:  RIGHT hip pain for 2 days.  No injury. EXAM: DG HIP (WITH OR WITHOUT PELVIS) 2-3V RIGHT COMPARISON:  None. FINDINGS: There is no evidence of hip fracture or dislocation. Mild bilateral superolateral acetabular spurring. No advanced arthropathy or other focal bone abnormality. Mild aortoiliac calcifications. IMPRESSION: No acute osseous process. Aortic Atherosclerosis (ICD10-I70.0). Electronically Signed   By: Elon Alas M.D.   On: 05/28/2018 01:02   Vas [place] Lower Extremity Venous (dvt)  Result Date: 05/29/2018  Lower Venous Study Indications: Edema, and DVT found 05/28/18 in the left leg.  Performing Technologist: Sharion Dove RVS  Examination Guidelines: A complete evaluation includes B-mode imaging, spectral Doppler, color Doppler, and power Doppler as needed of all accessible portions of each vessel. Bilateral testing is considered an integral part of a complete examination. Limited examinations for reoccurring indications may be performed as noted.  Right Venous Findings: +---------+---------------+---------+-----------+----------+----------+          CompressibilityPhasicitySpontaneityPropertiesSummary    +---------+---------------+---------+-----------+----------+----------+ CFV      Full           Yes      Yes                              +---------+---------------+---------+-----------+----------+----------+ SFJ      Full                                                    +---------+---------------+---------+-----------+----------+----------+ FV Prox  Full                                                    +---------+---------------+---------+-----------+----------+----------+ FV Mid   Full                                                    +---------+---------------+---------+-----------+----------+----------+ FV DistalFull                                                    +---------+---------------+---------+-----------+----------+----------+ PFV      Full                                                    +---------+---------------+---------+-----------+----------+----------+ POP                     Yes      Yes                  visualized +---------+---------------+---------+-----------+----------+----------+ PTV  Full                                                    +---------+---------------+---------+-----------+----------+----------+ PERO     Full                                                    +---------+---------------+---------+-----------+----------+----------+  Left Venous Findings: +---+---------------+---------+-----------+----------+-------+    CompressibilityPhasicitySpontaneityPropertiesSummary +---+---------------+---------+-----------+----------+-------+ CFVNone           No       No                   Acute   +---+---------------+---------+-----------+----------+-------+    Summary: Right: There is no evidence of deep vein thrombosis in the lower extremity. Ultrasound characteristics of enlarged lymph nodes are noted in the groin. Left: Findings consistent with known acute deep vein thrombosis involving the left common femoral vein.  *See table(s) above for measurements and observations. Electronically signed by Monica Martinez MD on 05/29/2018  at 2:10:48 PM.    Final    Vas [place] Lower Extremity Venous (dvt)  Result Date: 05/28/2018  Lower Venous Study Indications: Edema.  Performing Technologist: Toma Copier RVS  Examination Guidelines: A complete evaluation includes B-mode imaging, spectral Doppler, color Doppler, and power Doppler as needed of all accessible portions of each vessel. Bilateral testing is considered an integral part of a complete examination. Limited examinations for reoccurring indications may be performed as noted.  Right Venous Findings: +---+---------------+---------+-----------+----------+-------+    CompressibilityPhasicitySpontaneityPropertiesSummary +---+---------------+---------+-----------+----------+-------+ CFVFull           Yes      Yes                          +---+---------------+---------+-----------+----------+-------+ SFJFull                                                 +---+---------------+---------+-----------+----------+-------+  Left Venous Findings: +---------+---------------+---------+-----------+----------+-------------------+          CompressibilityPhasicitySpontaneityPropertiesSummary             +---------+---------------+---------+-----------+----------+-------------------+ CFV      None           No       No                                       +---------+---------------+---------+-----------+----------+-------------------+ SFJ      Partial        No       Yes                                      +---------+---------------+---------+-----------+----------+-------------------+ FV Prox  None           No       No                                       +---------+---------------+---------+-----------+----------+-------------------+  FV Mid   None                                                             +---------+---------------+---------+-----------+----------+-------------------+ FV DistalNone           No       No                                        +---------+---------------+---------+-----------+----------+-------------------+ PFV      Full           No       Yes                                      +---------+---------------+---------+-----------+----------+-------------------+ POP      None           No       No                                       +---------+---------------+---------+-----------+----------+-------------------+ PTV      Partial        No       No                                       +---------+---------------+---------+-----------+----------+-------------------+ PERO                                                  Unable to visualize                                                       well enough to                                                            evaluate.           +---------+---------------+---------+-----------+----------+-------------------+ External iliac vein thrombosed. Unable to see IVC due to bowel gas    Summary: Right: There is no evidence of a common femoral vein obstruction Left: Findings consistent with acute deep vein thrombosis involving the left common femoral vein, left femoral vein, left popliteal vein, and left posterior tibial vein. See technician comments listed above  *See table(s) above for measurements and observations. Electronically signed by Curt Jews MD on 05/28/2018 at 2:31:50 PM.    Final       Subjective: Alert, oriented to person, place and partly to time.  Aware that she is in the hospital due  to left leg swelling from a clot.  Denies pain or any other complaints.  As per RN, no acute issues noted.  Discharge Exam:  Vitals:   06/01/18 1439 06/01/18 2111 06/01/18 2206 06/02/18 0647  BP: 108/71  114/69 (!) 126/94  Pulse: 97  86 87  Resp: 17  20   Temp: 99 F (37.2 C) 98.8 F (37.1 C)  98.8 F (37.1 C)  TempSrc: Oral   Oral  SpO2: 100%  100% 100%  Weight:      Height:        General exam: Pleasant middle-aged  female, moderately built and overweight lying comfortably supine in bed.   Respiratory system: Clear to auscultation.  No increased work of breathing.  Cardiovascular system: S1 & S2 heard, RRR. No JVD, murmurs, rubs, gallops or clicks.  Gastrointestinal system: Abdomen is nondistended, soft and nontender. No organomegaly or masses felt. Normal bowel sounds heard.   Central nervous system: Mental status as noted above. No focal neurological deficits. Extremities: Symmetric 5 x 5 power in upper extremities and at least grade 4 x 5 in lower extremities.  Persistent diffuse left lower extremity edema up to the groin, now has thigh-high left lower extremity compression stocking and edema may be slightly better than yesterday.  Trace right leg edema.  Bilateral knees with TKR scars but no acute findings. Skin: No rashes, lesions or ulcers Psychiatry: Judgement and insight impaired. Mood & affect appropriate.    The results of significant diagnostics from this hospitalization (including imaging, microbiology, ancillary and laboratory) are listed below for reference.     Microbiology: Recent Results (from the past 240 hour(s))  Blood Culture (routine x 2)     Status: None (Preliminary result)   Collection Time: 05/28/18  4:26 AM  Result Value Ref Range Status   Specimen Description BLOOD RIGHT HAND  Final   Special Requests   Final    BOTTLES DRAWN AEROBIC ONLY Blood Culture results may not be optimal due to an inadequate volume of blood received in culture bottles   Culture   Final    NO GROWTH 4 DAYS Performed at [facility], [place] [address] [place], [place] [address]    Report Status PENDING  Incomplete  Blood Culture (routine x 2)     Status: None (Preliminary result)   Collection Time: 05/28/18  8:27 AM  Result Value Ref Range Status   Specimen Description BLOOD BLOOD LEFT HAND  Final   Special Requests   Final    BOTTLES DRAWN AEROBIC ONLY Blood Culture adequate volume   Culture    Final    NO GROWTH 4 DAYS Performed at [facility], [place] [address] [place], [place] [address]    Report Status PENDING  Incomplete     Labs: CBC: Recent Labs  Lab 05/27/18 2305 05/28/18 0205 05/28/18 0807 05/29/18 0150  WBC 17.9*  --  14.2* 9.6  NEUTROABS 15.7*  --  10.4*  --   HGB 14.8 16.0* 13.0 13.2  HCT 46.9* 47.0* 42.4 41.7  MCV 83.9  --  84.8 83.6  PLT 318  --  265 646   Basic Metabolic Panel: Recent Labs  Lab 05/27/18 2321 05/28/18 0205 05/28/18 0807 05/29/18 0150 05/30/18 0600 05/31/18 0500  NA 135 140 140 138 137 136  K 3.6 3.1* 3.6 3.2* 3.4* 4.2  CL 96*  --  106 107 106 103  CO2 22  --  20* '23 24 25  ' GLUCOSE  557*  --  105* 180* 114* 86  BUN 19  --  '16 14 12 13  ' CREATININE 1.46*  --  0.94 0.82 0.96 0.92  CALCIUM 9.6  --  9.1 8.7* 9.4 9.0  MG  --   --  1.4* 2.1  --   --    Liver Function Tests: Recent Labs  Lab 05/27/18 2321 05/28/18 0807  AST 25 20  ALT 18 13  ALKPHOS 96 83  BILITOT 0.9 0.4  PROT 7.9 6.4*  ALBUMIN 3.3* 2.9*   Cardiac Enzymes: Recent Labs  Lab 05/28/18 0807  TROPONINI <0.03   CBG: Recent Labs  Lab 06/01/18 1127 06/01/18 1628 06/01/18 2109 06/02/18 0824 06/02/18 1227  GLUCAP 142* 169* 125* 120* 232*   Urinalysis    Component Value Date/Time   COLORURINE YELLOW 05/28/2018 0212   APPEARANCEUR CLOUDY (A) 05/28/2018 0212   LABSPEC 1.027 05/28/2018 0212   PHURINE 5.0 05/28/2018 0212   GLUCOSEU >=500 (A) 05/28/2018 0212   HGBUR MODERATE (A) 05/28/2018 0212   BILIRUBINUR NEGATIVE 05/28/2018 [place] 05/28/2018 0212   PROTEINUR 30 (A) 05/28/2018 0212   NITRITE NEGATIVE 05/28/2018 0212   LEUKOCYTESUR LARGE (A) 05/28/2018 0212    I discussed in detail with patient's daughter, updated care and answered questions.  Time coordinating discharge: 40 minutes  SIGNED:  Vernell Leep, MD, FACP, [facility]. [facility]  To contact the attending provider between 7A-7P or the covering provider  during after hours 7P-7A, please log into the web site [email or web address] and access using universal [place] password for that web site. If you do not have the password, please call the hospital operator.

## 2018-06-03 NOTE — Care Management Important Message (Signed)
Important Message  Patient Details  Name: Alyssa Haley MRN: [id number] Date of Birth: [birth year]   Medicare Important Message Given:  Yes    Parker Wherley 06/03/2018, 7:45 AM

## 2018-07-07 ENCOUNTER — Encounter (HOSPITAL_COMMUNITY): Payer: Self-pay

## 2018-07-07 ENCOUNTER — Inpatient Hospital Stay (HOSPITAL_COMMUNITY)
Admission: EM | Admit: 2018-07-07 | Discharge: 2018-07-15 | DRG: 871 | Disposition: A | Discharge status: Home Health | Payer: Medicare Other | Attending: Internal Medicine | Admitting: Internal Medicine

## 2018-07-07 DIAGNOSIS — Z993 Dependence on wheelchair: Secondary | ICD-10-CM

## 2018-07-07 DIAGNOSIS — R29810 Facial weakness: Secondary | ICD-10-CM | POA: Diagnosis present

## 2018-07-07 DIAGNOSIS — Z7901 Long term (current) use of anticoagulants: Secondary | ICD-10-CM

## 2018-07-07 DIAGNOSIS — E785 Hyperlipidemia, unspecified: Secondary | ICD-10-CM | POA: Diagnosis present

## 2018-07-07 DIAGNOSIS — Z87891 Personal history of nicotine dependence: Secondary | ICD-10-CM | POA: Diagnosis not present

## 2018-07-07 DIAGNOSIS — Z880 Allergy status to penicillin: Secondary | ICD-10-CM

## 2018-07-07 DIAGNOSIS — A419 Sepsis, unspecified organism: Secondary | ICD-10-CM | POA: Diagnosis not present

## 2018-07-07 DIAGNOSIS — N390 Urinary tract infection, site not specified: Secondary | ICD-10-CM

## 2018-07-07 DIAGNOSIS — Z955 Presence of coronary angioplasty implant and graft: Secondary | ICD-10-CM

## 2018-07-07 DIAGNOSIS — L89153 Pressure ulcer of sacral region, stage 3: Secondary | ICD-10-CM | POA: Clinically undetermined

## 2018-07-07 DIAGNOSIS — I2510 Atherosclerotic heart disease of native coronary artery without angina pectoris: Secondary | ICD-10-CM | POA: Diagnosis present

## 2018-07-07 DIAGNOSIS — I10 Essential (primary) hypertension: Secondary | ICD-10-CM | POA: Diagnosis present

## 2018-07-07 DIAGNOSIS — L8990 Pressure ulcer of unspecified site, unspecified stage: Secondary | ICD-10-CM

## 2018-07-07 DIAGNOSIS — Z96659 Presence of unspecified artificial knee joint: Secondary | ICD-10-CM | POA: Diagnosis present

## 2018-07-07 DIAGNOSIS — N179 Acute kidney failure, unspecified: Secondary | ICD-10-CM | POA: Diagnosis present

## 2018-07-07 DIAGNOSIS — M109 Gout, unspecified: Secondary | ICD-10-CM | POA: Diagnosis present

## 2018-07-07 DIAGNOSIS — E1165 Type 2 diabetes mellitus with hyperglycemia: Secondary | ICD-10-CM | POA: Diagnosis present

## 2018-07-07 DIAGNOSIS — K219 Gastro-esophageal reflux disease without esophagitis: Secondary | ICD-10-CM | POA: Diagnosis present

## 2018-07-07 DIAGNOSIS — E875 Hyperkalemia: Secondary | ICD-10-CM | POA: Diagnosis present

## 2018-07-07 DIAGNOSIS — Z79899 Other long term (current) drug therapy: Secondary | ICD-10-CM | POA: Diagnosis not present

## 2018-07-07 DIAGNOSIS — Z794 Long term (current) use of insulin: Secondary | ICD-10-CM | POA: Diagnosis not present

## 2018-07-07 LAB — CBC WITH DIFFERENTIAL/PLATELET
Abs Immature Granulocytes: 0.05 10*3/uL (ref 0.00–0.07)
BASOS ABS: 0 10*3/uL (ref 0.0–0.1)
Basophils Relative: 1 %
Eosinophils Absolute: 0.1 10*3/uL (ref 0.0–0.5)
Eosinophils Relative: 1 %
HCT: 49.6 % — ABNORMAL HIGH (ref 36.0–46.0)
Hemoglobin: 15.3 g/dL — ABNORMAL HIGH (ref 12.0–15.0)
Immature Granulocytes: 1 %
LYMPHS PCT: 28 %
Lymphs Abs: 1.8 10*3/uL (ref 0.7–4.0)
MCH: 26 pg (ref 26.0–34.0)
MCHC: 30.8 g/dL (ref 30.0–36.0)
MCV: 84.4 fL (ref 80.0–100.0)
Monocytes Absolute: 0.4 10*3/uL (ref 0.1–1.0)
Monocytes Relative: 7 %
Neutro Abs: 4.1 10*3/uL (ref 1.7–7.7)
Neutrophils Relative %: 62 %
PLATELETS: 143 10*3/uL — AB (ref 150–400)
RBC: 5.88 MIL/uL — ABNORMAL HIGH (ref 3.87–5.11)
RDW: 15.1 % (ref 11.5–15.5)
WBC: 6.5 10*3/uL (ref 4.0–10.5)
nRBC: 0 % (ref 0.0–0.2)

## 2018-07-07 LAB — COMPREHENSIVE METABOLIC PANEL
ALBUMIN: 3.7 g/dL (ref 3.5–5.0)
ALT: 20 U/L (ref 0–44)
AST: 24 U/L (ref 15–41)
Alkaline Phosphatase: 114 U/L (ref 38–126)
Anion gap: 13 (ref 5–15)
BUN: 22 mg/dL (ref 8–23)
CHLORIDE: 96 mmol/L — AB (ref 98–111)
CO2: 19 mmol/L — ABNORMAL LOW (ref 22–32)
Calcium: 9.2 mg/dL (ref 8.9–10.3)
Creatinine, Ser: 1.56 mg/dL — ABNORMAL HIGH (ref 0.44–1.00)
GFR calc Af Amer: 39 mL/min — ABNORMAL LOW (ref >60)
GFR calc non Af Amer: 34 mL/min — ABNORMAL LOW (ref >60)
GLUCOSE: 388 mg/dL — AB (ref 70–99)
Potassium: 5.9 mmol/L — ABNORMAL HIGH (ref 3.5–5.1)
SODIUM: 128 mmol/L — AB (ref 135–145)
Total Bilirubin: 0.5 mg/dL (ref 0.3–1.2)
Total Protein: 8.6 g/dL — ABNORMAL HIGH (ref 6.5–8.1)

## 2018-07-07 LAB — URINALYSIS, ROUTINE W REFLEX MICROSCOPIC
Bilirubin Urine: NEGATIVE
Glucose, UA: >=500 mg/dL — AB
Ketones, ur: 5 mg/dL — AB
Nitrite: NEGATIVE
Protein, ur: 100 mg/dL — AB
RBC / HPF: >50 RBC/hpf — ABNORMAL HIGH (ref 0–5)
Specific Gravity, Urine: 1.017 (ref 1.005–1.030)
WBC, UA: >50 WBC/hpf — ABNORMAL HIGH (ref 0–5)
pH: 5 (ref 5.0–8.0)

## 2018-07-07 LAB — GLUCOSE, CAPILLARY: Glucose-Capillary: 249 mg/dL — ABNORMAL HIGH (ref 70–99)

## 2018-07-07 MED ORDER — INSULIN ASPART 100 UNIT/ML ~~LOC~~ SOLN
0.0000 [IU] | Freq: Three times a day (TID) | SUBCUTANEOUS | Status: DC
  Administered 2018-07-08: 8 [IU] via SUBCUTANEOUS
  Administered 2018-07-08: 5 [IU] via SUBCUTANEOUS
  Administered 2018-07-08: 3 [IU] via SUBCUTANEOUS
  Administered 2018-07-09 (×2): 2 [IU] via SUBCUTANEOUS
  Administered 2018-07-10 – 2018-07-12 (×5): 3 [IU] via SUBCUTANEOUS
  Administered 2018-07-12: 5 [IU] via SUBCUTANEOUS
  Administered 2018-07-13: 2 [IU] via SUBCUTANEOUS
  Administered 2018-07-13: 3 [IU] via SUBCUTANEOUS
  Administered 2018-07-13: 5 [IU] via SUBCUTANEOUS
  Administered 2018-07-14: 8 [IU] via SUBCUTANEOUS
  Administered 2018-07-14: 3 [IU] via SUBCUTANEOUS
  Administered 2018-07-14 – 2018-07-15 (×2): 5 [IU] via SUBCUTANEOUS
  Administered 2018-07-15: 2 [IU] via SUBCUTANEOUS

## 2018-07-07 MED ORDER — SODIUM CHLORIDE 0.9 % IV SOLN
1.0000 g | INTRAVENOUS | Status: DC
  Administered 2018-07-08 – 2018-07-10 (×3): 1 g via INTRAVENOUS
  Filled 2018-07-07 (×3): qty 10

## 2018-07-07 MED ORDER — ONDANSETRON HCL 4 MG PO TABS: 4.0000 mg | ORAL_TABLET | Freq: Four times a day (QID) | ORAL | Status: DC | PRN

## 2018-07-07 MED ORDER — ACETAMINOPHEN 325 MG PO TABS
650.0000 mg | ORAL_TABLET | Freq: Four times a day (QID) | ORAL | Status: DC | PRN
  Administered 2018-07-08: 650 mg via ORAL
  Filled 2018-07-07 (×2): qty 2

## 2018-07-07 MED ORDER — SEMAGLUTIDE(0.25 OR 0.5MG/DOS) 2 MG/1.5ML ~~LOC~~ SOPN: 0.2500 mg | PEN_INJECTOR | SUBCUTANEOUS | Status: DC

## 2018-07-07 MED ORDER — SODIUM CHLORIDE 0.9 % IV SOLN
1.0000 g | Freq: Once | INTRAVENOUS | Status: AC
  Administered 2018-07-07: 1 g via INTRAVENOUS
  Filled 2018-07-07: qty 10

## 2018-07-07 MED ORDER — PANTOPRAZOLE SODIUM 40 MG PO TBEC
40.0000 mg | DELAYED_RELEASE_TABLET | Freq: Every day | ORAL | Status: DC
  Administered 2018-07-08 – 2018-07-15 (×8): 40 mg via ORAL
  Filled 2018-07-07 (×8): qty 1

## 2018-07-07 MED ORDER — ACETAMINOPHEN 650 MG RE SUPP: 650.0000 mg | Freq: Four times a day (QID) | RECTAL | Status: DC | PRN

## 2018-07-07 MED ORDER — ALLOPURINOL 100 MG PO TABS
100.0000 mg | ORAL_TABLET | Freq: Two times a day (BID) | ORAL | Status: DC
  Administered 2018-07-07 – 2018-07-15 (×16): 100 mg via ORAL
  Filled 2018-07-07 (×16): qty 1

## 2018-07-07 MED ORDER — GABAPENTIN 300 MG PO CAPS
300.0000 mg | ORAL_CAPSULE | Freq: Two times a day (BID) | ORAL | Status: DC
  Administered 2018-07-07 – 2018-07-15 (×16): 300 mg via ORAL
  Filled 2018-07-07 (×16): qty 1

## 2018-07-07 MED ORDER — ONDANSETRON HCL 4 MG/2ML IJ SOLN: 4.0000 mg | Freq: Four times a day (QID) | INTRAMUSCULAR | Status: DC | PRN

## 2018-07-07 MED ORDER — ZOLPIDEM TARTRATE 5 MG PO TABS: 5.0000 mg | ORAL_TABLET | Freq: Every evening | ORAL | Status: DC | PRN

## 2018-07-07 MED ORDER — SODIUM CHLORIDE 0.9 % IV BOLUS
500.0000 mL | Freq: Once | INTRAVENOUS | Status: AC
  Administered 2018-07-07: 500 mL via INTRAVENOUS

## 2018-07-07 MED ORDER — SODIUM CHLORIDE 0.9 % IV SOLN
INTRAVENOUS | Status: AC
  Administered 2018-07-07: 23:00:00 via INTRAVENOUS

## 2018-07-07 MED ORDER — APIXABAN 5 MG PO TABS
5.0000 mg | ORAL_TABLET | Freq: Two times a day (BID) | ORAL | Status: DC
  Administered 2018-07-07 – 2018-07-15 (×16): 5 mg via ORAL
  Filled 2018-07-07 (×16): qty 1

## 2018-07-07 MED ORDER — INSULIN GLARGINE 100 UNIT/ML ~~LOC~~ SOLN
30.0000 [IU] | Freq: Every day | SUBCUTANEOUS | Status: DC
  Administered 2018-07-08 – 2018-07-14 (×7): 30 [IU] via SUBCUTANEOUS
  Filled 2018-07-07 (×9): qty 0.3

## 2018-07-07 MED ORDER — METOPROLOL SUCCINATE ER 50 MG PO TB24
50.0000 mg | ORAL_TABLET | Freq: Every day | ORAL | Status: DC
  Administered 2018-07-08 – 2018-07-15 (×8): 50 mg via ORAL
  Filled 2018-07-07 (×8): qty 1

## 2018-07-07 MED ORDER — INSULIN ASPART 100 UNIT/ML IV SOLN
10.0000 [IU] | Freq: Once | INTRAVENOUS | Status: AC
  Administered 2018-07-07: 10 [IU] via INTRAVENOUS

## 2018-07-07 MED ORDER — SODIUM CHLORIDE 0.9 % IV BOLUS
1000.0000 mL | Freq: Once | INTRAVENOUS | Status: AC
  Administered 2018-07-07: 1000 mL via INTRAVENOUS

## 2018-07-07 MED ORDER — ATORVASTATIN CALCIUM 80 MG PO TABS
80.0000 mg | ORAL_TABLET | Freq: Every day | ORAL | Status: DC
  Administered 2018-07-08 – 2018-07-15 (×8): 80 mg via ORAL
  Filled 2018-07-07 (×8): qty 1

## 2018-07-07 NOTE — ED Notes (Signed)
Pt incontinent of urine, pur wick malpositioned; pt cleaned, brief and linins changed, purewick repositioned

## 2018-07-07 NOTE — H&P (Signed)
History and Physical    Alyssa Haley WCH:[id number] DOB: Oct 03, 1950 DOA: 07/07/2018  PCP: [name], MD   Patient coming from: Home.  I have personally briefly reviewed patient's old medical records in [facility]  Chief Complaint: Altered mental status.  HPI: Alyssa Haley is a 68 y.o. female with medical history significant of CAD, type 2 diabetes, GERD, gout, hyperlipidemia, hypertension, history of sepsis due to UTI, multiple admissions for UTIs who is brought by EMS to the emergency department with decreased mentation, cloudy malodorous urine and recent abnormal blood test with elevated potassium.  The patient's daughter stated earlier her symptoms are similar to her previous episodes of UTI.  She complains of dry mouth and having lower abdominal pain.  She denies headache, dyspnea or chest pain at this time.  She is unable to elaborate as to when her symptoms started.  ED Course: Initial vital signs temperature 98 F, pulse 84, respirations 16, blood pressure 129/84 mmHg and O2 sat 98% on room air.  She received 500 mL of NS bolus and ceftriaxone 1 g IVPB.  Urinalysis showed 3 red appearance, with glucosuria more than 500, ketonuria, proteinuria 100 mg/dL.  There was large hemoglobinuria and large leukocyte esterase.  There were many bacteria with more than 50 RBCs and 1-50 WBCs per hpf.  Her white count was 6.5, hemoglobin 15.3 g/dL and platelets 353.  Sodium 128, potassium 5.9, chloride 96, CO2 19 mmol/L.  BUN was 22, creatinine 1.56, glucose 180 a calcium 9.2 mg/dL.  Total protein was 8.6 g/dL.  The rest of the LFTs are within normal limits.  Review of Systems: Unable to fully obtain..  Past Medical History:  Diagnosis Date  . Coronary artery disease   . Diabetes mellitus without complication (HCC)   . GERD (gastroesophageal reflux disease)   . Gout   . Hyperlipidemia   . Hypertension   . Sepsis (HCC) 05/28/2018  . UTI (urinary tract infection) 05/2018     Past Surgical History:  Procedure Laterality Date  . CARDIAC CATHETERIZATION    . CORONARY ANGIOPLASTY    . CORONARY STENT PLACEMENT    . REPLACEMENT TOTAL KNEE       reports that she has quit smoking. She has never used smokeless tobacco. She reports previous alcohol use. She reports that she does not use drugs.  Allergies  Allergen Reactions  . Penicillins Hives    Did it involve swelling of the face/tongue/throat, SOB, or low BP? No Did it involve sudden or severe rash/hives, skin peeling, or any reaction on the inside of your mouth or nose? Yes Did you need to seek medical attention at a hospital or doctor's office? No When did it last happen? If all above answers are "NO", may proceed with cephalosporin use.     Family History  Problem Relation Age of Onset  . Diabetes Mellitus II Neg Hx    Prior to Admission medications   Medication Sig Start Date End Date Taking? Authorizing Provider  allopurinol (ZYLOPRIM) 100 MG tablet Take 100 mg by mouth 2 (two) times daily.   Yes [provider]  apixaban (ELIQUIS) 5 MG TABS tablet Take 1 tablet (5 mg total) by mouth 2 (two) times daily. To be started 06/04/2018. Patient taking differently: Take 5 mg by mouth 2 (two) times daily.  06/04/18  Yes Hongalgi, Maximino Greenland, MD  atorvastatin (LIPITOR) 80 MG tablet Take 80 mg by mouth daily. 12/22/17  Yes [provider]  gabapentin (NEURONTIN) 300 MG  capsule Take 1 capsule (300 mg total) by mouth 2 (two) times daily. 06/02/18  Yes Hongalgi, Maximino Greenland, MD  Insulin Glargine, 1 Unit Dial, (TOUJEO SOLOSTAR) 300 UNIT/ML SOPN Inject 35 Units into the skin daily. Patient taking differently: Inject 30 Units into the skin daily.  06/03/18  Yes Hongalgi, Maximino Greenland, MD  metoprolol succinate (TOPROL-XL) 50 MG 24 hr tablet Take 50 mg by mouth daily. 12/22/17  Yes [provider]  Multiple Vitamin (MULTIVITAMIN WITH MINERALS) TABS tablet Take 1 tablet by mouth daily. 06/03/18  Yes  Hongalgi, Maximino Greenland, MD  omeprazole (PRILOSEC) 40 MG capsule Take 40 mg by mouth daily.   Yes [provider]  oxyCODONE-acetaminophen (PERCOCET) 10-325 MG tablet Take 1 tablet by mouth every 12 (twelve) hours as needed (Moderate - Severe pain.). 06/02/18  Yes Hongalgi, Maximino Greenland, MD  Semaglutide (OZEMPIC, 0.25 OR 0.5 MG/DOSE, [place]) Inject 0.25 mg into the skin once a week.   Yes [provider]  zolpidem (AMBIEN) 10 MG tablet Take 10 mg by mouth at bedtime as needed for sleep.   Yes [provider]  apixaban (ELIQUIS) 5 MG TABS tablet Take 2 tablets (10 mg total) by mouth 2 (two) times daily. Discontinue after 06/03/2018 doses. Patient not taking: Reported on 07/07/2018 06/02/18   Elease Etienne, MD  feeding supplement, GLUCERNA SHAKE, (GLUCERNA SHAKE) LIQD Take 237 mLs by mouth 3 (three) times daily between meals. Patient not taking: Reported on 07/07/2018 06/02/18   Elease Etienne, MD  insulin aspart (NOVOLOG) 100 UNIT/ML injection Inject 0-9 Units into the skin 3 (three) times daily with meals. CBG < 70: implement hypoglycemia protocol CBG 70 - 120: 0 units CBG 121 - 150: 1 unit CBG 151 - 200: 2 units CBG 201 - 250: 3 units CBG 251 - 300: 5 units CBG 301 - 350: 7 units CBG 351 - 400: 9 units CBG > 400: call MD. Patient not taking: Reported on 07/07/2018 06/02/18   Elease Etienne, MD  polyethylene glycol [facility]) packet Take 17 g by mouth daily. Patient not taking: Reported on 07/07/2018 06/02/18   Elease Etienne, MD    Physical Exam: Vitals:   07/07/18 1900 07/07/18 1915 07/07/18 1930 07/07/18 1945  BP: 119/79 (!) 132/91 126/78 124/77  Pulse: 89 88 86 86  Resp: (!) Temp:      TempSrc:      SpO2: 97% 98% 97% 96%  Weight:      Height:        Constitutional: Frail, looks acutely ill. Eyes: PERRL, lids and conjunctivae normal ENMT: Mucous membranes are dry. Posterior pharynx clear of any exudate or lesions Neck: normal, supple, no masses, no  thyromegaly Respiratory: Decreased breath sounds in bases, otherwise clear to auscultation bilaterally, no wheezing, no crackles. Normal respiratory effort. No accessory muscle use.  Cardiovascular: Regular rate and rhythm, no murmurs / rubs / gallops. No extremity edema. 2+ pedal pulses. No carotid bruits.  Abdomen: Soft, positive suprapubic tenderness, no masses palpated. No hepatosplenomegaly. Bowel sounds positive.  Musculoskeletal: no clubbing / cyanosis. Good ROM, no contractures. Normal muscle tone.  Skin: no rashes, lesions, ulcers. No induration Neurologic: Moves all extremities.  Mild generalized weakness. Psychiatric: Alert and oriented x 2, disoriented to time, date and situation.    Labs on Admission: I have personally reviewed following labs and imaging studies  CBC: Recent Labs  Lab 07/07/18 1511  WBC 6.5  NEUTROABS 4.1  HGB 15.3*  HCT 49.6*  MCV 84.4  PLT 143*   Basic Metabolic Panel: Recent Labs  Lab 07/07/18 1511  NA 128*  K 5.9*  CL 96*  CO2 19*  GLUCOSE 388*  BUN 22  CREATININE 1.56*  CALCIUM 9.2   GFR: Estimated Creatinine Clearance: 42.5 mL/min (A) (by C-G formula based on SCr of 1.56 mg/dL (H)). Liver Function Tests: Recent Labs  Lab 07/07/18 1511  AST 24  ALT 20  ALKPHOS 114  BILITOT 0.5  PROT 8.6*  ALBUMIN 3.7   No results for input(s): LIPASE, AMYLASE in the last 168 hours. No results for input(s): AMMONIA in the last 168 hours. Coagulation Profile: No results for input(s): INR, PROTIME in the last 168 hours. Cardiac Enzymes: No results for input(s): CKTOTAL, CKMB, CKMBINDEX, TROPONINI in the last 168 hours. BNP (last 3 results) No results for input(s): PROBNP in the last 8760 hours. HbA1C: No results for input(s): HGBA1C in the last 72 hours. CBG: No results for input(s): GLUCAP in the last 168 hours. Lipid Profile: No results for input(s): CHOL, HDL, LDLCALC, TRIG, CHOLHDL, LDLDIRECT in the last 72 hours. Thyroid Function  Tests: No results for input(s): TSH, T4TOTAL, FREET4, T3FREE, THYROIDAB in the last 72 hours. Anemia Panel: No results for input(s): VITAMINB12, FOLATE, FERRITIN, TIBC, IRON, RETICCTPCT in the last 72 hours. Urine analysis:    Component Value Date/Time   COLORURINE AMBER (A) 07/07/2018 1654   APPEARANCEUR TURBID (A) 07/07/2018 1654   LABSPEC 1.017 07/07/2018 1654   PHURINE 5.0 07/07/2018 1654   GLUCOSEU >=500 (A) 07/07/2018 1654   HGBUR LARGE (A) 07/07/2018 1654   BILIRUBINUR NEGATIVE 07/07/2018 1654   KETONESUR 5 (A) 07/07/2018 1654   PROTEINUR 100 (A) 07/07/2018 1654   NITRITE NEGATIVE 07/07/2018 1654   LEUKOCYTESUR LARGE (A) 07/07/2018 1654    Radiological Exams on Admission: No results found.  EKG: Independently reviewed. Vent. rate 83 BPM PR interval * ms QRS duration 79 ms QT/QTc 379/446 ms P-R-T axes 70 66 53 Sinus rhythm Probable left atrial enlargement Anteroseptal infarct, old  Assessment/Plan Principal Problem:   Sepsis due to urinary tract infection (HCC)   Acute lower UTI Admit to telemetry/inpatient. Continue IV fluids. Continue ceftriaxone 1 g IVPB every 24 hours. Follow-up blood culture and sensitivity. Follow-up urine culture and sensitivity.  Active Problems:   AKI Continue IV fluids. Monitor intake and output. Follow-up renal function and electrolytes.    CAD (coronary artery disease) Continue apixaban, atorvastatin and metoprolol.    Essential hypertension Continue metoprolol succinate 50 mg p.o. daily. Monitor BP and HR.    Uncontrolled type 2 diabetes mellitus with hyperglycemia (HCC) Carbohydrate modified diet. Continue Lantus 35 units SQ daily. CBG monitoring regular insulin sliding scale.    Gout Continue allopurinol 100 mg p.o. daily.    GERD (gastroesophageal reflux disease) Protonix 40 mg p.o. daily.    Hyperlipidemia Continue atorvastatin 80 mg p.o. daily. Monitor LFTs as needed.    DVT prophylaxis: On  apixaban. Code Status: Full code. Family Communication: None at bedside. Disposition Plan: Admit for IV hydration and IV antibiotic therapy. Consults called: Admission status: Inpatient/medical telemetry.   Bobette Mo MD [facility]  07/07/2018, 8:47 PM   This document was prepared using Dragon voice recognition software and may contain some unintended transcription errors.

## 2018-07-07 NOTE — Progress Notes (Addendum)
Pt arrived to [address], no orders in chart, admission paged. Pt identified appropriately.  Pt alert and oriented to self, place disoriented to time and situation. VS stable, denied pain and discomfort. No signs of acute distress. No family at the bedside. Pt knowledge about hx questions very limited. Pt is lethargic. Pt has very dry skin, with stage II pressure injury to her sacrum and black scabbed are to right medial foot next to great toe.    Oriented to room and equipment, instructed to use call bell for assistance, call bell left within reach.  Cardiac monitor in place and CCMD notified, no belongings at the bedside. Bed alarm in place.  Will continue to monitor and treat pt per MD orders.

## 2018-07-07 NOTE — ED Notes (Addendum)
ED TO INPATIENT HANDOFF REPORT  ED Nurse Name and Phone #: [facility] [place] [phone]  S Name/Age/Gender Alyssa Haley 68 y.o. female Room/Bed: 041C/041C  Code Status   Code Status: Prior  Home/SNF/Other Home Patient oriented to: self and place Is this baseline? unknown  Triage Complete: Triage complete  Chief Complaint sick  Triage Note Pt from home via ems; per pt's daughter pt has been more lethargic than normal and has had cloudy malodorous urine; also says that pt had blood drawn recently and was told hat her postassium was abnormal; ems unable to get a BP reading  CBG 431 HR 110 98% RA  Given PTA: 300 NS 4 mg zofran     Allergies Allergies  Allergen Reactions  . Penicillins Hives    Did it involve swelling of the face/tongue/throat, SOB, or low BP? No Did it involve sudden or severe rash/hives, skin peeling, or any reaction on the inside of your mouth or nose? Yes Did you need to seek medical attention at a hospital or doctor's office? No When did it last happen? If all above answers are "NO", may proceed with cephalosporin use.     Level of Care/Admitting Diagnosis ED Disposition    ED Disposition Condition Comment   Admit  Hospital Area: [facility] [[id number]]  Level of Care: Medical Telemetry [104]  Diagnosis: Sepsis due to urinary tract infection [facility]) [[id number]]  Admitting Physician: Bobette Mo [[id number]]  Attending Physician: Bobette Mo [[id number]]  Estimated length of stay: past midnight tomorrow  Certification:: I certify this patient will need inpatient services for at least 2 midnights  PT Class (Do Not Modify): Inpatient [101]  PT Acc Code (Do Not Modify): Private [1]       B Medical/Surgery History Past Medical History:  Diagnosis Date  . Coronary artery disease   . Diabetes mellitus without complication (HCC)   . GERD (gastroesophageal reflux disease)   . Gout   . Hyperlipidemia   . Hypertension   .  Sepsis (HCC) 05/28/2018  . UTI (urinary tract infection) 05/2018   Past Surgical History:  Procedure Laterality Date  . CARDIAC CATHETERIZATION    . CORONARY ANGIOPLASTY    . CORONARY STENT PLACEMENT    . REPLACEMENT TOTAL KNEE       A IV Location/Drains/Wounds Patient Lines/Drains/Airways Status   Active Line/Drains/Airways    Name:   Placement date:   Placement time:   Site:   Days:   Peripheral IV 05/30/18 Right;Posterior Forearm   05/30/18    0625    Forearm   38   Peripheral IV 07/07/18 Left Hand   07/07/18    -    Hand   less than 1          Intake/Output Last 24 hours  Intake/Output Summary (Last 24 hours) at 07/07/2018 1858 Last data filed at 07/07/2018 1653 Gross per 24 hour  Intake 500.42 ml  Output -  Net 500.42 ml    Labs/Imaging Results for orders placed or performed during the hospital encounter of 07/07/18 (from the past 48 hour(s))  CBC with Differential/Platelet     Status: Abnormal   Collection Time: 07/07/18  3:11 PM  Result Value Ref Range   WBC 6.5 4.0 - 10.5 K/uL   RBC 5.88 (H) 3.87 - 5.11 MIL/uL   Hemoglobin 15.3 (H) 12.0 - 15.0 g/dL   HCT 46.9 (H) 62.9 - 52.8 %   MCV 84.4 80.0 - 100.0 fL   MCH 26.0  26.0 - 34.0 pg   MCHC 30.8 30.0 - 36.0 g/dL   RDW 05.6 97.9 - 48.0 %   Platelets 143 (L) 150 - 400 K/uL   nRBC 0.0 0.0 - 0.2 %   Neutrophils Relative % 62 %   Neutro Abs 4.1 1.7 - 7.7 K/uL   Lymphocytes Relative 28 %   Lymphs Abs 1.8 0.7 - 4.0 K/uL   Monocytes Relative 7 %   Monocytes Absolute 0.4 0.1 - 1.0 K/uL   Eosinophils Relative 1 %   Eosinophils Absolute 0.1 0.0 - 0.5 K/uL   Basophils Relative 1 %   Basophils Absolute 0.0 0.0 - 0.1 K/uL   WBC Morphology VACUOLATED NEUTROPHILS    Immature Granulocytes 1 %   Abs Immature Granulocytes 0.05 0.00 - 0.07 K/uL   Burr Cells PRESENT     Comment: Performed at [facility], [address] [place], [place] [address]  Comprehensive metabolic panel     Status: Abnormal   Collection Time:  07/07/18  3:11 PM  Result Value Ref Range   Sodium 128 (L) 135 - 145 mmol/L   Potassium 5.9 (H) 3.5 - 5.1 mmol/L   Chloride 96 (L) 98 - 111 mmol/L   CO2 19 (L) 22 - 32 mmol/L   Glucose, Bld 388 (H) 70 - 99 mg/dL   BUN 22 8 - 23 mg/dL   Creatinine, Ser 7.48 (H) 0.44 - 1.00 mg/dL   Calcium 9.2 8.9 - 27.0 mg/dL   Total Protein 8.6 (H) 6.5 - 8.1 g/dL   Albumin 3.7 3.5 - 5.0 g/dL   AST 24 15 - 41 U/L   ALT 20 0 - 44 U/L   Alkaline Phosphatase 114 38 - 126 U/L   Total Bilirubin 0.5 0.3 - 1.2 mg/dL   GFR calc non Af Amer 34 (L) >60 mL/min   GFR calc Af Amer 39 (L) >60 mL/min   Anion gap 13 5 - 15    Comment: Performed at [facility], [address] [place], [place] [address]  Urinalysis, Routine w reflex microscopic     Status: Abnormal   Collection Time: 07/07/18  4:54 PM  Result Value Ref Range   Color, Urine AMBER (A) YELLOW    Comment: BIOCHEMICALS MAY BE AFFECTED BY COLOR   APPearance TURBID (A) CLEAR   Specific Gravity, Urine 1.017 1.005 - 1.030   pH 5.0 5.0 - 8.0   Glucose, UA >=500 (A) NEGATIVE mg/dL   Hgb urine dipstick LARGE (A) NEGATIVE   Bilirubin Urine NEGATIVE NEGATIVE   Ketones, ur 5 (A) NEGATIVE mg/dL   Protein, ur 449 (A) NEGATIVE mg/dL   Nitrite NEGATIVE NEGATIVE   Leukocytes,Ua LARGE (A) NEGATIVE   RBC / HPF >50 (H) 0 - 5 RBC/hpf   WBC, UA >50 (H) 0 - 5 WBC/hpf   Bacteria, UA MANY (A) NONE SEEN   Squamous Epithelial / LPF 6-10 0 - 5    Comment: Performed at [facility], [address] [place], [place] [address]   No results found.  Pending Labs Unresulted Labs (From admission, onward)    Start     Ordered   07/07/18 1455  Urine culture  ONCE - STAT,   STAT     07/07/18 1454          Vitals/Pain Today's Vitals   07/07/18 1545 07/07/18 1615 07/07/18 1645 07/07/18 1800  BP: (!) 152/92 124/80 (!) 144/86 (!) 142/85  Pulse: 89 83 85 (!)  102  Resp: 18 14 17 20   Temp:      TempSrc:      SpO2: 96% 96% 98% 97%  Weight:      Height:       PainSc:        Isolation Precautions No active isolations  Medications Medications  sodium chloride 0.9 % bolus 1,000 mL (has no administration in time range)  sodium chloride 0.9 % bolus 500 mL (0 mLs Intravenous Stopped 07/07/18 1653)  insulin aspart (novoLOG) injection 10 Units (10 Units Intravenous Given 07/07/18 1721)  sodium chloride 0.9 % bolus 500 mL (500 mLs Intravenous New Bag/Given 07/07/18 1721)  cefTRIAXone (ROCEPHIN) 1 g in sodium chloride 0.9 % 100 mL IVPB (1 g Intravenous New Bag/Given 07/07/18 1759)    Mobility      Focused Assessments Cardiac Assessment Handoff:    Lab Results  Component Value Date   TROPONINI <0.03 05/28/2018   No results found for: DDIMER Does the Patient currently have chest pain? No     R Recommendations: See Admitting Provider Note  Report given to:   Additional Notes: pt dtates she is ambulatory w/ walker and/or cane

## 2018-07-07 NOTE — ED Triage Notes (Signed)
Pt from home via ems; per pt's daughter pt has been more lethargic than normal and has had cloudy malodorous urine; also says that pt had blood drawn recently and was told hat her postassium was abnormal; ems unable to get a BP reading  CBG 431 HR 110 98% RA  Given PTA: 300 NS 4 mg zofran

## 2018-07-07 NOTE — ED Provider Notes (Signed)
[facility] EMERGENCY DEPARTMENT Provider Note   CSN: [id number] Arrival date & time: 07/07/18  1425    History   Chief Complaint Chief Complaint  Patient presents with  . Urinary Tract Infection    HPI Alyssa Haley is a 68 y.o. female.     HPI Patient is brought in by EMS for concerns for possible UTI.  EMS reports that daughter stated that urine was cloudy and malodorous.  She also stated that her mother was more lethargic than normal.  Patient only complains of mild left buttock plain.  No trauma or open wounds.  No known fever or chills. Past Medical History:  Diagnosis Date  . Coronary artery disease   . Diabetes mellitus without complication (HCC)   . GERD (gastroesophageal reflux disease)   . Gout   . Hyperlipidemia   . Hypertension   . Sepsis (HCC) 05/28/2018  . UTI (urinary tract infection) 05/2018    Patient Active Problem List   Diagnosis Date Noted  . Sepsis due to urinary tract infection (HCC) 07/07/2018  . GERD (gastroesophageal reflux disease) 07/07/2018  . Hyperlipidemia 07/07/2018  . Sepsis (HCC) 05/28/2018  . CAD (coronary artery disease) 05/28/2018  . Essential hypertension 05/28/2018  . Uncontrolled type 2 diabetes mellitus with hyperglycemia (HCC) 05/28/2018  . Gout 05/28/2018  . Acute lower UTI 05/28/2018  . Swelling of joint of left knee 05/28/2018  . Sepsis secondary to UTI (HCC) 05/28/2018    Past Surgical History:  Procedure Laterality Date  . CARDIAC CATHETERIZATION    . CORONARY ANGIOPLASTY    . CORONARY STENT PLACEMENT    . REPLACEMENT TOTAL KNEE       OB History   No obstetric history on file.      Home Medications    Prior to Admission medications   Medication Sig Start Date End Date Taking? Authorizing Provider  allopurinol (ZYLOPRIM) 100 MG tablet Take 100 mg by mouth 2 (two) times daily.   Yes [provider]  apixaban (ELIQUIS) 5 MG TABS tablet Take 1 tablet (5 mg total) by mouth 2  (two) times daily. To be started 06/04/2018. Patient taking differently: Take 5 mg by mouth 2 (two) times daily.  06/04/18  Yes Hongalgi, Maximino Greenland, MD  atorvastatin (LIPITOR) 80 MG tablet Take 80 mg by mouth daily. 12/22/17  Yes [provider]  gabapentin (NEURONTIN) 300 MG capsule Take 1 capsule (300 mg total) by mouth 2 (two) times daily. 06/02/18  Yes Hongalgi, Maximino Greenland, MD  Insulin Glargine, 1 Unit Dial, (TOUJEO SOLOSTAR) 300 UNIT/ML SOPN Inject 35 Units into the skin daily. Patient taking differently: Inject 30 Units into the skin daily.  06/03/18  Yes Hongalgi, Maximino Greenland, MD  metoprolol succinate (TOPROL-XL) 50 MG 24 hr tablet Take 50 mg by mouth daily. 12/22/17  Yes [provider]  Multiple Vitamin (MULTIVITAMIN WITH MINERALS) TABS tablet Take 1 tablet by mouth daily. 06/03/18  Yes Hongalgi, Maximino Greenland, MD  omeprazole (PRILOSEC) 40 MG capsule Take 40 mg by mouth daily.   Yes [provider]  oxyCODONE-acetaminophen (PERCOCET) 10-325 MG tablet Take 1 tablet by mouth every 12 (twelve) hours as needed (Moderate - Severe pain.). 06/02/18  Yes Hongalgi, Maximino Greenland, MD  Semaglutide (OZEMPIC, 0.25 OR 0.5 MG/DOSE, [place]) Inject 0.25 mg into the skin once a week.   Yes [provider]  zolpidem (AMBIEN) 10 MG tablet Take 10 mg by mouth at bedtime as needed for sleep.   Yes [provider]  apixaban (ELIQUIS) 5 MG TABS tablet Take 2 tablets (10 mg total) by mouth 2 (two) times daily. Discontinue after 06/03/2018 doses. Patient not taking: Reported on 07/07/2018 06/02/18   Elease Etienne, MD  feeding supplement, GLUCERNA SHAKE, (GLUCERNA SHAKE) LIQD Take 237 mLs by mouth 3 (three) times daily between meals. Patient not taking: Reported on 07/07/2018 06/02/18   Elease Etienne, MD  insulin aspart (NOVOLOG) 100 UNIT/ML injection Inject 0-9 Units into the skin 3 (three) times daily with meals. CBG < 70: implement hypoglycemia protocol CBG 70 - 120: 0 units CBG 121 - 150: 1  unit CBG 151 - 200: 2 units CBG 201 - 250: 3 units CBG 251 - 300: 5 units CBG 301 - 350: 7 units CBG 351 - 400: 9 units CBG > 400: call MD. Patient not taking: Reported on 07/07/2018 06/02/18   Elease Etienne, MD  polyethylene glycol [facility]) packet Take 17 g by mouth daily. Patient not taking: Reported on 07/07/2018 06/02/18   Elease Etienne, MD    Family History Family History  Problem Relation Age of Onset  . Diabetes Mellitus II Neg Hx     Social History Social History   Tobacco Use  . Smoking status: Former Games developer  . Smokeless tobacco: Never Used  Substance Use Topics  . Alcohol use: Not Currently  . Drug use: Never     Allergies   Penicillins   Review of Systems Review of Systems  Constitutional: Positive for fatigue. Negative for chills and fever.  HENT: Negative for trouble swallowing.   Eyes: Negative for visual disturbance.  Respiratory: Negative for cough and shortness of breath.   Cardiovascular: Negative for chest pain.  Gastrointestinal: Negative for abdominal pain, diarrhea, nausea and vomiting.  Genitourinary: Negative for dysuria, flank pain and hematuria.  Musculoskeletal: Positive for myalgias.  Skin: Negative for rash and wound.  Neurological: Negative for dizziness, weakness, light-headedness, numbness and headaches.  All other systems reviewed and are negative.    Physical Exam Updated Vital Signs BP 121/83 (BP Location: Left Arm)   Pulse 85   Temp 98.4 F (36.9 C) (Oral)   Resp 16   Ht  (1.651 m)   Wt 89.5 kg   SpO2 100%   BMI 32.83 kg/m   Physical Exam Vitals signs and nursing note reviewed.  Constitutional:      General: She is not in acute distress.    Appearance: Normal appearance. She is well-developed. She is not ill-appearing.  HENT:     Head: Normocephalic and atraumatic.     Mouth/Throat:     Mouth: Mucous membranes are moist.  Eyes:     Extraocular Movements: Extraocular movements intact.     Pupils:  Pupils are equal, round, and reactive to light.  Neck:     Musculoskeletal: Normal range of motion and neck supple. No neck rigidity or muscular tenderness.  Cardiovascular:     Rate and Rhythm: Normal rate and regular rhythm.     Heart sounds: No murmur. No friction rub. No gallop.   Pulmonary:     Effort: Pulmonary effort is normal. No respiratory distress.     Breath sounds: Normal breath sounds. No stridor. No wheezing, rhonchi or rales.  Chest:     Chest wall: No tenderness.  Abdominal:     General: Bowel sounds are normal.     Palpations: Abdomen is soft.     Tenderness: There is no abdominal tenderness. There is no right  CVA tenderness, left CVA tenderness, guarding or rebound.  Musculoskeletal: Normal range of motion.        General: No swelling, tenderness, deformity or signs of injury.     Right lower leg: No edema.     Left lower leg: No edema.     Comments: 2+ distal pulses.  No lower extremity swelling, asymmetry or tenderness.  Mild tenderness to palpation of the left buttock without skin changes, erythema or warmth.  No crepitance.  No midline thoracic or lumbar tenderness.  Lymphadenopathy:     Cervical: No cervical adenopathy.  Skin:    General: Skin is warm and dry.     Findings: No erythema or rash.  Neurological:     General: No focal deficit present.     Mental Status: She is alert.     Comments: Patient is mildly slow to answer questions but does so appropriately.  Moves all extremities without focal deficit.  Sensation intact.  Psychiatric:        Behavior: Behavior normal.      ED Treatments / Results  Labs (all labs ordered are listed, but only abnormal results are displayed) Labs Reviewed  URINALYSIS, ROUTINE W REFLEX MICROSCOPIC - Abnormal; Notable for the following components:      Result Value   Color, Urine AMBER (*)    APPearance TURBID (*)    Glucose, UA >=500 (*)    Hgb urine dipstick LARGE (*)    Ketones, ur 5 (*)    Protein, ur 100 (*)     Leukocytes,Ua LARGE (*)    RBC / HPF >50 (*)    WBC, UA >50 (*)    Bacteria, UA MANY (*)    All other components within normal limits  CBC WITH DIFFERENTIAL/PLATELET - Abnormal; Notable for the following components:   RBC 5.88 (*)    Hemoglobin 15.3 (*)    HCT 49.6 (*)    Platelets 143 (*)    All other components within normal limits  COMPREHENSIVE METABOLIC PANEL - Abnormal; Notable for the following components:   Sodium 128 (*)    Potassium 5.9 (*)    Chloride 96 (*)    CO2 19 (*)    Glucose, Bld 388 (*)    Creatinine, Ser 1.56 (*)    Total Protein 8.6 (*)    GFR calc non Af Amer 34 (*)    GFR calc Af Amer 39 (*)    All other components within normal limits  GLUCOSE, CAPILLARY - Abnormal; Notable for the following components:   Glucose-Capillary 249 (*)    All other components within normal limits  URINE CULTURE  CBC WITH DIFFERENTIAL/PLATELET  COMPREHENSIVE METABOLIC PANEL    EKG EKG Interpretation  Date/Time:  Monday July 07 2018 15:47:52 EST Ventricular Rate:  83 PR Interval:    QRS Duration: 79 QT Interval:  379 QTC Calculation: 446 R Axis:   66 Text Interpretation:  Sinus rhythm Probable left atrial enlargement Anteroseptal infarct, old Confirmed by Loren Racer ([id number]) on 07/07/2018 11:02:07 PM   Radiology No results found.  Procedures Procedures (including critical care time)  Medications Ordered in ED Medications  0.9 %  sodium chloride infusion ( Intravenous New Bag/Given 07/07/18 2249)  ondansetron (ZOFRAN) tablet 4 mg (has no administration in time range)    Or  ondansetron (ZOFRAN) injection 4 mg (has no administration in time range)  acetaminophen (TYLENOL) tablet 650 mg (has no administration in time range)    Or  acetaminophen (TYLENOL)  suppository 650 mg (has no administration in time range)  cefTRIAXone (ROCEPHIN) 1 g in sodium chloride 0.9 % 100 mL IVPB (has no administration in time range)  allopurinol (ZYLOPRIM) tablet 100 mg (100  mg Oral Given 07/07/18 2248)  apixaban (ELIQUIS) tablet 5 mg (5 mg Oral Given 07/07/18 2247)  atorvastatin (LIPITOR) tablet 80 mg (has no administration in time range)  gabapentin (NEURONTIN) capsule 300 mg (300 mg Oral Given 07/07/18 2248)  insulin glargine (LANTUS) injection 30 Units (has no administration in time range)  metoprolol succinate (TOPROL-XL) 24 hr tablet 50 mg (has no administration in time range)  pantoprazole (PROTONIX) EC tablet 40 mg (has no administration in time range)  zolpidem (AMBIEN) tablet 5 mg (has no administration in time range)  Semaglutide(0.25 or 0.5MG /DOS) SOPN 0.25 mg (has no administration in time range)  insulin aspart (novoLOG) injection 0-15 Units (has no administration in time range)  sodium chloride 0.9 % bolus 500 mL (0 mLs Intravenous Stopped 07/07/18 1653)  insulin aspart (novoLOG) injection 10 Units (10 Units Intravenous Given 07/07/18 1721)  sodium chloride 0.9 % bolus 500 mL (0 mLs Intravenous Stopped 07/07/18 1925)  cefTRIAXone (ROCEPHIN) 1 g in sodium chloride 0.9 % 100 mL IVPB (0 g Intravenous Stopped 07/07/18 1902)  sodium chloride 0.9 % bolus 1,000 mL (1,000 mLs Intravenous New Bag/Given 07/07/18 1905)     Initial Impression / Assessment and Plan / ED Course  I have reviewed the triage vital signs and the nursing notes.  Pertinent labs & imaging results that were available during my care of the patient were reviewed by me and considered in my medical decision making (see chart for details).       Patient with evidence of UTI and acute kidney injury.  Possibly related to dehydration.  Given IV fluids.  Patient also has elevation in her potassium.  No EKG changes.  Given IV insulin.  Urine was sent for culture and started on IV Rocephin.  Patient is tolerated this in the past.  Discussed with hospitalist who will see patient emergency department and admit.   Final Clinical Impressions(s) / ED Diagnoses   Final diagnoses:  Lower urinary tract infectious  disease  AKI (acute kidney injury) [facility])  Hyperkalemia    ED Discharge Orders    None       Loren Racer, MD 07/07/18 2303

## 2018-07-08 ENCOUNTER — Other Ambulatory Visit: Payer: Self-pay

## 2018-07-08 LAB — CBC WITH DIFFERENTIAL/PLATELET
Abs Immature Granulocytes: 0.03 10*3/uL (ref 0.00–0.07)
BASOS PCT: 1 %
Basophils Absolute: 0.1 10*3/uL (ref 0.0–0.1)
Eosinophils Absolute: 0.2 10*3/uL (ref 0.0–0.5)
Eosinophils Relative: 2 %
HCT: 40.8 % (ref 36.0–46.0)
Hemoglobin: 12.8 g/dL (ref 12.0–15.0)
Immature Granulocytes: 1 %
Lymphocytes Relative: 64 %
Lymphs Abs: 4.1 10*3/uL — ABNORMAL HIGH (ref 0.7–4.0)
MCH: 26.2 pg (ref 26.0–34.0)
MCHC: 31.4 g/dL (ref 30.0–36.0)
MCV: 83.4 fL (ref 80.0–100.0)
Monocytes Absolute: 0.5 10*3/uL (ref 0.1–1.0)
Monocytes Relative: 8 %
Neutro Abs: 1.5 10*3/uL — ABNORMAL LOW (ref 1.7–7.7)
Neutrophils Relative %: 24 %
Platelets: 137 10*3/uL — ABNORMAL LOW (ref 150–400)
RBC: 4.89 MIL/uL (ref 3.87–5.11)
RDW: 15.5 % (ref 11.5–15.5)
WBC Morphology: ABNORMAL
WBC: 6.3 10*3/uL (ref 4.0–10.5)
nRBC: 0 % (ref 0.0–0.2)

## 2018-07-08 LAB — GLUCOSE, CAPILLARY
Glucose-Capillary: 161 mg/dL — ABNORMAL HIGH (ref 70–99)
Glucose-Capillary: 298 mg/dL — ABNORMAL HIGH (ref 70–99)
Glucose-Capillary: 227 mg/dL — ABNORMAL HIGH (ref 70–99)
Glucose-Capillary: 182 mg/dL — ABNORMAL HIGH (ref 70–99)

## 2018-07-08 LAB — COMPREHENSIVE METABOLIC PANEL
ALT: 14 U/L (ref 0–44)
AST: 15 U/L (ref 15–41)
Albumin: 2.9 g/dL — ABNORMAL LOW (ref 3.5–5.0)
Alkaline Phosphatase: 87 U/L (ref 38–126)
Anion gap: 6 (ref 5–15)
BUN: 17 mg/dL (ref 8–23)
CO2: 25 mmol/L (ref 22–32)
Calcium: 8.7 mg/dL — ABNORMAL LOW (ref 8.9–10.3)
Chloride: 105 mmol/L (ref 98–111)
Creatinine, Ser: 1.05 mg/dL — ABNORMAL HIGH (ref 0.44–1.00)
GFR calc Af Amer: >60 mL/min (ref >60)
GFR, EST NON AFRICAN AMERICAN: 55 mL/min — AB (ref >60)
Glucose, Bld: 215 mg/dL — ABNORMAL HIGH (ref 70–99)
Potassium: 4.5 mmol/L (ref 3.5–5.1)
Sodium: 136 mmol/L (ref 135–145)
Total Bilirubin: 0.5 mg/dL (ref 0.3–1.2)
Total Protein: 6.8 g/dL (ref 6.5–8.1)

## 2018-07-08 MED ORDER — TRAMADOL HCL 50 MG PO TABS
50.0000 mg | ORAL_TABLET | Freq: Three times a day (TID) | ORAL | Status: DC
  Filled 2018-07-08: qty 1

## 2018-07-08 MED ORDER — OXYCODONE HCL 5 MG PO TABS
5.0000 mg | ORAL_TABLET | Freq: Three times a day (TID) | ORAL | Status: DC | PRN
  Administered 2018-07-08 – 2018-07-14 (×2): 5 mg via ORAL
  Filled 2018-07-08 (×2): qty 1

## 2018-07-08 NOTE — Evaluation (Signed)
Physical Therapy Evaluation Patient Details Name: Alyssa Haley MRN: [id number] DOB: 11-06-1950 Today's Date: 07/08/2018   History of Present Illness  Pt is a 68 y/o female admitted secondary to increased lethargy. Found to have sepsis from UTI. PMH includes CAD, gout, HTN, and DM.   Clinical Impression  Pt admitted secondary to problem above with deficits below. Pt initially not responding to questions, however, would respond to questions as session progressed. Unsure of pt's baseline as no family present. Pt requiring mod to max A to perform bed mobility tasks this session. Pt refusing OOB mobility. Feel pt is at increased risk for falls and will require increased assist at d/c. Will continue to follow acutely to maximize functional mobility independence and safety.     Follow Up Recommendations SNF;Supervision/Assistance - 24 hour    Equipment Recommendations  None recommended by PT    Recommendations for Other Services       Precautions / Restrictions Precautions Precautions: Fall Restrictions Weight Bearing Restrictions: No      Mobility  Bed Mobility Overal bed mobility: Needs Assistance Bed Mobility: Supine to Sit;Sit to Supine;Rolling Rolling: Mod assist   Supine to sit: Max assist Sit to supine: Mod assist   General bed mobility comments: Max A for LE assist and trunk elevation to come to sitting at EOB. Once sitting at EOB, pt refusing further OOB mobility. Required mod A to return to supine for LE management. Noted difficulty sequencing and required multimodal cues. Noted pt had soiled her bed at end of session. Practiced rolling to L and R for cleanup. Required mod A to roll from side to side and cues for sequencing.   Transfers                    Ambulation/Gait                Stairs            Wheelchair Mobility    Modified Rankin (Stroke Patients Only)       Balance Overall balance assessment: Needs assistance Sitting-balance  support: No upper extremity supported;Feet supported Sitting balance-Leahy Scale: Fair                                       Pertinent Vitals/Pain Pain Assessment: No/denies pain    Home Living Family/patient expects to be discharged to:: Private residence Living Arrangements: Children Available Help at Discharge: Family;Available 24 hours/day Type of Home: House Home Access: Stairs to enter   [employer] of Steps: Pt reports steps but could not give a number Home Layout: One level Home Equipment: Walker - 2 wheels Additional Comments: Unsure of accuracy of home information give cognitive deficits.     Prior Function Level of Independence: Needs assistance   Gait / Transfers Assistance Needed: Pt reports using RW for mobility   ADL's / Homemaking Assistance Needed: Reports her daughter assists with ADL tasks occasionally.         Hand Dominance        Extremity/Trunk Assessment   Upper Extremity Assessment Upper Extremity Assessment: Generalized weakness    Lower Extremity Assessment Lower Extremity Assessment: Generalized weakness    Cervical / Trunk Assessment Cervical / Trunk Assessment: Kyphotic  Communication   Communication: Other (comment)(very slow to respond to questions)  Cognition Arousal/Alertness: Lethargic Behavior During Therapy: Flat affect Overall Cognitive Status: No family/caregiver present to determine  baseline cognitive functioning                                 General Comments: Pt initially not responsive to verbal questions, however, as session progressed, pt would occasionally answer questions. Pt very slow to respond to verbal questions. Pt oriented to place and time, however, required increased time to respond. Would not respond to question asking about her name.       General Comments General comments (skin integrity, edema, etc.): No family present during session.     Exercises      Assessment/Plan    PT Assessment Patient needs continued PT services  PT Problem List Decreased strength;Decreased activity tolerance;Decreased balance;Decreased mobility;Decreased cognition;Decreased knowledge of use of DME;Decreased safety awareness;Decreased knowledge of precautions       PT Treatment Interventions DME instruction;Gait training;Functional mobility training;Therapeutic activities;Therapeutic exercise;Balance training;Patient/family education;Cognitive remediation    PT Goals (Current goals can be found in the Care Plan section)  Acute Rehab PT Goals PT Goal Formulation: Patient unable to participate in goal setting Time For Goal Achievement: 07/22/18 Potential to Achieve Goals: Fair    Frequency Min 2X/week   Barriers to discharge        Co-evaluation               AM-PAC PT "6 Clicks" Mobility  Outcome Measure Help needed turning from your back to your side while in a flat bed without using bedrails?: A Lot Help needed moving from lying on your back to sitting on the side of a flat bed without using bedrails?: A Lot Help needed moving to and from a bed to a chair (including a wheelchair)?: A Lot Help needed standing up from a chair using your arms (e.g., wheelchair or bedside chair)?: A Lot Help needed to walk in hospital room?: Total Help needed climbing 3-5 steps with a railing? : Total 6 Click Score: 10    End of Session   Activity Tolerance: Patient tolerated treatment well Patient left: in bed;with call bell/phone within reach;with bed alarm set;with nursing/sitter in room Nurse Communication: Mobility status PT Visit Diagnosis: Unsteadiness on feet (R26.81);Muscle weakness (generalized) (M62.81);Difficulty in walking, not elsewhere classified (R26.2)    Time: 0017-4944 PT Time Calculation (min) (ACUTE ONLY): 24 min   Charges:   PT Evaluation $PT Eval Moderate Complexity: 1 Mod PT Treatments $Therapeutic Activity: 8-22 mins         Gladys Damme, PT, DPT  Acute Rehabilitation Services  Pager: [phone] Office: [phone]   Lehman Prom 07/08/2018, 11:07 AM

## 2018-07-08 NOTE — [place] FL2 (Signed)
[place] MEDICAID FL2 LEVEL OF CARE SCREENING TOOL     IDENTIFICATION  Patient Name: Alyssa Haley Birthdate: [birth year] Sex: female Admission Date (Current Location): 07/07/2018  [facility] and [place] Number:  Producer, television/film/video and Address:  [facility], [address], [place], [place] [address]      Provider Number: [id number]  Attending Physician Name and Address:  Leroy Sea, MD  Relative Name and Phone Number:  Ival Bible, daughter, [phone]    Current Level of Care: Hospital Recommended Level of Care: Skilled Nursing Facility Prior Approval Number:    Date Approved/Denied:   PASRR Number: [id number]  Discharge Plan: SNF    Current Diagnoses: Patient Active Problem List   Diagnosis Date Noted  . Sepsis due to urinary tract infection (HCC) 07/07/2018  . GERD (gastroesophageal reflux disease) 07/07/2018  . Hyperlipidemia 07/07/2018  . Pressure injury of skin 07/07/2018  . Sepsis (HCC) 05/28/2018  . CAD (coronary artery disease) 05/28/2018  . Essential hypertension 05/28/2018  . Uncontrolled type 2 diabetes mellitus with hyperglycemia (HCC) 05/28/2018  . Gout 05/28/2018  . Acute lower UTI 05/28/2018  . Swelling of joint of left knee 05/28/2018  . Sepsis secondary to UTI (HCC) 05/28/2018    Orientation RESPIRATION BLADDER Height & Weight     Self, Place  Normal Incontinent, External catheter Weight: 89.5 kg Height:  5\' 5"  (165.1 cm)  BEHAVIORAL SYMPTOMS/MOOD NEUROLOGICAL BOWEL NUTRITION STATUS      Continent Diet(Please see DC Summary)  AMBULATORY STATUS COMMUNICATION OF NEEDS Skin   Extensive Assist Verbally PU Stage and Appropriate Care(Stage II on sacrum)                       Personal Care Assistance Level of Assistance  Bathing, Feeding, Dressing Bathing Assistance: Maximum assistance Feeding assistance: Limited assistance Dressing Assistance: Limited assistance     Functional Limitations Info  Sight,  Hearing, Speech Sight Info: Adequate Hearing Info: Adequate Speech Info: Adequate    SPECIAL CARE FACTORS FREQUENCY  PT (By licensed PT), OT (By licensed OT)     PT Frequency: 5x/week OT Frequency: 3x/week            Contractures Contractures Info: Not present    Additional Factors Info  Code Status, Allergies, Insulin Sliding Scale Code Status Info: Full Allergies Info: Penicillins   Insulin Sliding Scale Info: 3x daily with meals       Current Medications (07/08/2018):  This is the current hospital active medication list Current Facility-Administered Medications  Medication Dose Route Frequency Provider Last Rate Last Dose  . 0.9 %  sodium chloride infusion   Intravenous Continuous Leroy Sea, MD 50 mL/hr at 07/08/18 0818    . acetaminophen (TYLENOL) tablet 650 mg  650 mg Oral Q6H PRN Bobette Mo, MD   650 mg at 07/08/18 1313  . allopurinol (ZYLOPRIM) tablet 100 mg  100 mg Oral BID Bobette Mo, MD   100 mg at 07/08/18 1012  . apixaban (ELIQUIS) tablet 5 mg  5 mg Oral BID Bobette Mo, MD   5 mg at 07/08/18 1012  . atorvastatin (LIPITOR) tablet 80 mg  80 mg Oral Daily Bobette Mo, MD   80 mg at 07/08/18 1012  . cefTRIAXone (ROCEPHIN) 1 g in sodium chloride 0.9 % 100 mL IVPB  1 g Intravenous Q24H Bobette Mo, MD      . gabapentin (NEURONTIN) capsule 300 mg  300 mg Oral  BID Bobette Mo, MD   300 mg at 07/08/18 1012  . insulin aspart (novoLOG) injection 0-15 Units  0-15 Units Subcutaneous TID WC Bobette Mo, MD   8 Units at 07/08/18 1219  . insulin glargine (LANTUS) injection 30 Units  30 Units Subcutaneous Daily Bobette Mo, MD   30 Units at 07/08/18 1014  . metoprolol succinate (TOPROL-XL) 24 hr tablet 50 mg  50 mg Oral Daily Bobette Mo, MD   50 mg at 07/08/18 1013  . ondansetron (ZOFRAN) tablet 4 mg  4 mg Oral Q6H PRN Bobette Mo, MD       Or  . ondansetron [facility]) injection 4 mg  4 mg  Intravenous Q6H PRN Bobette Mo, MD      . pantoprazole (PROTONIX) EC tablet 40 mg  40 mg Oral Daily Bobette Mo, MD   40 mg at 07/08/18 1013  . traMADol (ULTRAM) tablet 50 mg  50 mg Oral Q8H Leroy Sea, MD      . zolpidem (AMBIEN) tablet 5 mg  5 mg Oral QHS PRN Bobette Mo, MD         Discharge Medications: Please see discharge summary for a list of discharge medications.  Relevant Imaging Results:  Relevant Lab Results:   Additional Information SS#[id number] [place], [place]

## 2018-07-08 NOTE — Progress Notes (Signed)
PROGRESS NOTE                                                                                                                                                                                                             Patient Demographics:    Alyssa Haley, is a 68 y.o. female, DOB - Dec 07, 1950, YKD:[id number]  Admit date - 07/07/2018   Admitting Physician Bobette Mo, MD  Outpatient Primary MD for the patient is [name], MD  LOS - 1  Chief Complaint  Patient presents with  . Urinary Tract Infection       Brief Narrative  Alyssa Haley is a 68 y.o. female with medical history significant of CAD, type 2 diabetes, GERD, gout, hyperlipidemia, hypertension, history of sepsis due to UTI, multiple admissions for UTIs who is brought by EMS to the emergency department with decreased mentation, cloudy malodorous urine and recent abnormal blood test with elevated potassium, she was diagnosed with Sepsis due to UTI.   Subjective:    Erma Heritage today has, No headache, No chest pain, No abdominal pain - No Nausea, No new weakness tingling or numbness, No Cough - SOB.     Assessment  & Plan :     Sepsis due to urinary tract infection (HCC) due to  Acute lower UTI  - sepsis pathophysiology resolved, continue gentle hydration and empiric Rocephin follow cultures.  Clinically seems to be nontoxic and improving.   AKI - Continue IV fluids, renal function much improved.  CAD (coronary artery disease) - stable no acute issues, no chest pain continue combination of beta-blocker statin and Eliquis.   Essential hypertension - Continue metoprolol succinate 50 mg p.o. daily. Monitor BP and HR.  Gout - Continue allopurinol 100 mg p.o. daily.  GERD (gastroesophageal reflux disease) - Protonix 40 mg p.o. daily.  Hyperlipidemia - Continue atorvastatin 80 mg p.o. daily.  Generalized weakness deconditioning, appears to have a chronic right-sided facial droop.   Patient denies any knowledge of previous stroke.  States she is wheelchair-bound at baseline.  Supportive care.  Already on maximal treatment with Eliquis and statin.  Continue.  Initiate PT and initiate activity.  May require placement.    Uncontrolled type 2 diabetes mellitus with hyperglycemia (HCC) - on Lantus and sliding scale, low carbohydrate diet.  Monitor CBGs.  Lab Results  Component Value Date   HGBA1C 11.1 (H) 05/28/2018   CBG (last 3)  Recent Labs    07/07/18 2058 07/08/18 0843  GLUCAP 249*  161*        Family Communication  :  None  Code Status :  Full  Disposition Plan  :  TBD  Consults  :  None  Procedures  :      DVT Prophylaxis  :  Eliquis  Lab Results  Component Value Date   PLT 137 (L) 07/08/2018    Diet :  Diet Order            Diet heart healthy/carb modified Room service appropriate? Yes; Fluid consistency: Thin  Diet effective now               Inpatient Medications Scheduled Meds: . allopurinol  100 mg Oral BID  . apixaban  5 mg Oral BID  . atorvastatin  80 mg Oral Daily  . gabapentin  300 mg Oral BID  . insulin aspart  0-15 Units Subcutaneous TID WC  . insulin glargine  30 Units Subcutaneous Daily  . metoprolol succinate  50 mg Oral Daily  . pantoprazole  40 mg Oral Daily  . Semaglutide(0.25 or 0.5MG /DOS)  0.25 mg Subcutaneous Weekly   Continuous Infusions: . sodium chloride 50 mL/hr at 07/08/18 0818  . cefTRIAXone (ROCEPHIN)  IV     PRN Meds:.acetaminophen **OR** [DISCONTINUED] acetaminophen, ondansetron **OR** ondansetron (ZOFRAN) IV, zolpidem  Antibiotics  :   Anti-infectives (From admission, onward)   Start     Dose/Rate Route Frequency Ordered Stop   07/08/18 1800  cefTRIAXone (ROCEPHIN) 1 g in sodium chloride 0.9 % 100 mL IVPB     1 g 200 mL/hr over 30 Minutes Intravenous Every 24 hours 07/07/18 2058     07/07/18 1745  cefTRIAXone (ROCEPHIN) 1 g in sodium chloride 0.9 % 100 mL IVPB     1 g 200 mL/hr over 30  Minutes Intravenous  Once 07/07/18 1741 07/07/18 1902          Objective:   Vitals:   07/07/18 1945 07/07/18 2053 07/07/18 2200 07/08/18 0453  BP: 124/77 121/83  109/65  Pulse: 86 85  74  Resp: Temp:  98.4 F (36.9 C)  98.1 F (36.7 C)  TempSrc:  Oral  Oral  SpO2: 96% 100%  100%  Weight:   89.5 kg   Height:    (1.651 m)     Wt Readings from Last 3 Encounters:  07/07/18 89.5 kg  05/28/18 89.7 kg  12/28/17 99.8 kg     Intake/Output Summary (Last 24 hours) at 07/08/2018 0845 Last data filed at 07/08/2018 0615 Gross per 24 hour  Intake 1842.31 ml  Output 600 ml  Net 1242.31 ml     Physical Exam  Awake Alert, Oriented X 3, No new F.N deficits, appears to have old R facial droop, Normal affect [place].AT,PERRAL Supple Neck,No JVD, No cervical lymphadenopathy appriciated.  Symmetrical Chest wall movement, Good air movement bilaterally, CTAB RRR,No Gallops,Rubs or new Murmurs, No Parasternal Heave +ve B.Sounds, Abd Soft, No tenderness, No organomegaly appriciated, No rebound - guarding or rigidity. No Cyanosis, Clubbing or edema, No new Rash or bruise       Data Review:    CBC Recent Labs  Lab 07/07/18 1511 07/08/18 0421  WBC 6.5 6.3  HGB 15.3* 12.8  HCT 49.6* 40.8  PLT 143* 137*  MCV 84.4 83.4  MCH 26.0 26.2  MCHC 30.8 31.4  RDW 15.1 15.5  LYMPHSABS 1.8 4.1*  MONOABS 0.4 0.5  EOSABS 0.1 0.2  BASOSABS 0.0 0.1  Chemistries  Recent Labs  Lab 07/07/18 1511 07/08/18 0421  NA 128* 136  K 5.9* 4.5  CL 96* 105  CO2 19* 25  GLUCOSE 388* 215*  BUN 22 17  CREATININE 1.56* 1.05*  CALCIUM 9.2 8.7*  AST 24 15  ALT 20 14  ALKPHOS 114 87  BILITOT 0.5 0.5   ------------------------------------------------------------------------------------------------------------------ No results for input(s): CHOL, HDL, LDLCALC, TRIG, CHOLHDL, LDLDIRECT in the last 72 hours.  Lab Results  Component Value Date   HGBA1C 11.1 (H) 05/28/2018    ------------------------------------------------------------------------------------------------------------------ No results for input(s): TSH, T4TOTAL, T3FREE, THYROIDAB in the last 72 hours.  Invalid input(s): FREET3 ------------------------------------------------------------------------------------------------------------------ No results for input(s): VITAMINB12, FOLATE, FERRITIN, TIBC, IRON, RETICCTPCT in the last 72 hours.  Coagulation profile No results for input(s): INR, PROTIME in the last 168 hours.  No results for input(s): DDIMER in the last 72 hours.  Cardiac Enzymes No results for input(s): CKMB, TROPONINI, MYOGLOBIN in the last 168 hours.  Invalid input(s): CK ------------------------------------------------------------------------------------------------------------------ No results found for: BNP  Micro Results No results found for this or any previous visit (from the past 240 hour(s)).  Radiology Reports No results found.  Time Spent in minutes  30   Susa Raring M.D on 07/08/2018 at 8:45 AM  To page go to [email or web address] - password [facility]

## 2018-07-08 NOTE — Clinical Social Work Note (Signed)
Clinical Social Work Assessment  Patient Details  Name: Alyssa Haley MRN: [id number] Date of Birth: 04-Aug-1950  Date of referral:  07/08/18               Reason for consult:  Facility Placement                Permission sought to share information with:  Facility Industrial/product designer granted to share information::  No  Name::     Land::  SNFs  Relationship::  Daughter  Contact Information:  [phone]  Housing/Transportation Living arrangements for the past 2 months:  Single Family Home Source of Information:  Adult Children Patient Interpreter Needed:  None Criminal Activity/Legal Involvement Pertinent to Current Situation/Hospitalization:  No - Comment as needed Significant Relationships:  Adult Children Lives with:  Adult Children Do you feel safe going back to the place where you live?  No Need for family participation in patient care:  Yes (Comment)  Care giving concerns:  CSW received consult for possible SNF placement at time of discharge. CSW spoke with patient's daughter regarding PT recommendation of SNF placement at time of discharge. Patient's daughter reported that she is currently unable to care for patient at their home given patient's current physical needs and fall risk. Patient's daughter expressed understanding of PT recommendation and is agreeable to SNF placement at time of discharge. CSW to continue to follow and assist with discharge planning needs.   Social Worker assessment / plan:  CSW spoke with patient's daughter concerning possibility of rehab at [facility] before returning home.  Employment status:  Retired Health and safety inspector:  Medicare PT Recommendations:  Skilled Nursing Facility Information / Referral to community resources:  Skilled Nursing Facility  Patient/Family's Response to care:  Patient's daughter recognizes need for rehab before returning home and is agreeable to a SNF. Patient was just released from [facility] last Thursday so CSW will investigate whether they can take patient back with Medicaid picking up copays.   Patient/Family's Understanding of and Emotional Response to Diagnosis, Current Treatment, and Prognosis:  Patient/family is realistic regarding therapy needs and expressed being hopeful for SNF placement. Patient's daughter expressed understanding of CSW role and discharge process as well as medical condition. No questions/concerns about plan or treatment.    Emotional Assessment Appearance:  Appears stated age Attitude/Demeanor/Rapport:  Unable to Assess Affect (typically observed):  Unable to Assess Orientation:  Oriented to Self, Oriented to Place Alcohol / Substance use:  Not Applicable Psych involvement (Current and /or in the community):  No (Comment)  Discharge Needs  Concerns to be addressed:  Care Coordination Readmission within the last 30 days:  No Current discharge risk:  Dependent with Mobility, Cognitively Impaired Barriers to Discharge:  Continued Medical Work up   [employer], LCSW 07/08/2018, 2:38 PM

## 2018-07-08 NOTE — Progress Notes (Signed)
Inpatient Diabetes Program Recommendations  AACE/ADA: New Consensus Statement on Inpatient Glycemic Control (2015)  Target Ranges:  Prepandial:   less than 140 mg/dL      Peak postprandial:   less than 180 mg/dL (1-2 hours)      Critically ill patients:  140 - 180 mg/dL   Lab Results  Component Value Date   GLUCAP 298 (H) 07/08/2018   HGBA1C 11.1 (H) 05/28/2018    Review of Glycemic Control Results for Alyssa Haley, Alyssa Haley (MRN [id number]) as of 07/08/2018 15:23  Ref. Range 06/02/2018 08:24 06/02/2018 12:27 06/02/2018 17:28 07/07/2018 20:58 07/08/2018 08:43 07/08/2018 12:38  Glucose-Capillary Latest Ref Range: 70 - 99 mg/dL 030 (H) 131 (H) 438 (H) 249 (H) 161 (H) 298 (H)   Diabetes history: DM 2 Outpatient Diabetes medications: Toujeo 30 units daily, Novolog 0-9 units tid with meals, Ozempic 0.25 units weekly Current orders for Inpatient glycemic control:  Lantus 30 units daily, Novolog moderate tid with meals Inpatient Diabetes Program Recommendations:   Please consider adding Novolog meal coverage 4 units tid with meals (hold if patient eats less than 50%). Diabetes Coordinator spoke with patient during previous admission regarding A1C.  Likely needs titration of outpatient DM medications/ insulin after d/c as well.   Thanks,  Beryl Meager, RN, BC-ADM Inpatient Diabetes Coordinator Pager [phone] (8a-5p)

## 2018-07-09 LAB — GLUCOSE, CAPILLARY
GLUCOSE-CAPILLARY: 138 mg/dL — AB (ref 70–99)
Glucose-Capillary: 130 mg/dL — ABNORMAL HIGH (ref 70–99)
Glucose-Capillary: 116 mg/dL — ABNORMAL HIGH (ref 70–99)
Glucose-Capillary: 171 mg/dL — ABNORMAL HIGH (ref 70–99)

## 2018-07-09 MED ORDER — CEPHALEXIN 500 MG PO CAPS: 500.0000 mg | ORAL_CAPSULE | Freq: Three times a day (TID) | ORAL | 0 refills | Status: DC

## 2018-07-09 NOTE — Plan of Care (Signed)
  Problem: Safety: Goal: Ability to remain free from injury will improve Outcome: Progressing   Problem: Skin Integrity: Goal: Risk for impaired skin integrity will decrease Outcome: Progressing   

## 2018-07-09 NOTE — Discharge Summary (Addendum)
Alyssa Haley BUL:[id number] DOB: March 13, 1951 DOA: 07/07/2018  PCP: [name], MD  Admit date: 07/07/2018  Discharge date: 07/15/2018  Admitted From: Home  Disposition: Home   Recommendations for Outpatient Follow-up:   Follow up with PCP in 1-2 weeks  PCP Please obtain BMP/CBC, 2 view CXR in 1week,  (see Discharge instructions)   PCP Please follow up on the following pending results: Urine Culture   Home Health: None  Equipment/Devices: None  Consultations: None Discharge Condition: Fair   CODE STATUS: Full   Diet Recommendation: Heart Healthy Low Carb   Chief Complaint  Patient presents with  . Urinary Tract Infection     Brief history of present illness from the day of admission and additional interim summary    Alyssa Haley a 68 y.o.femalewith medical history significant ofCAD, type 2 diabetes, GERD, gout, hyperlipidemia, hypertension, history of sepsis due to UTI, multiple admissions for UTIs who is brought by EMS to the emergency department with decreased mentation, cloudy malodorous urine and recent abnormal blood test with elevated potassium, she was diagnosed with Sepsis due to UTI.                                                                 Hospital Course    Sepsis due to urinary tract infection (HCC) due to Acute lower UTI  - sepsis pathophysiology resolved, she responded very well to IV antibiotics and empiric IV Rocephin, urine cultures final results are pending but she is clinically responded to Rocephin and also received a dose of fosfomycin based on preliminary results, she has now completed her oral Keflex dose as well, she is now close to baseline, does have underlying weakness and deconditioning and although she has qualified for SNF she chose to go home instead.   Symptom-free this morning.   AKI - due to dehydration completely resolved after IV fluids and hydration.  CAD (coronary artery disease) - stable no acute issues, no chest pain continue combination of beta-blocker statin and Eliquis.   Essential hypertension - Continue metoprolol succinate at home dose and follow with PCP.  Gout - Continue allopurinol 100 mg p.o. daily.  GERD (gastroesophageal reflux disease) - Protonix 40 mg p.o. daily.  Hyperlipidemia - Continue atorvastatin 80 mg p.o. daily.  Generalized weakness deconditioning, appears to have a chronic right-sided facial droop.  Patient denies any knowledge of previous stroke.  States she is wheelchair-bound at baseline.  Supportive care.  Already on maximal treatment with Eliquis and statin.   She was seen by PT, qualified for SNF she initially wanted to go home but now wants to go to SNF.    Uncontrolled type 2 diabetes mellitus with hyperglycemia (HCC) - continue home regimen upon discharge.   Discharge diagnosis     Principal Problem:   Sepsis due  to urinary tract infection (HCC) Active Problems:   CAD (coronary artery disease)   Essential hypertension   Uncontrolled type 2 diabetes mellitus with hyperglycemia (HCC)   Gout   Acute lower UTI   GERD (gastroesophageal reflux disease)   Hyperlipidemia   Pressure injury of skin    Discharge instructions    Discharge Instructions    Discharge instructions   Complete by:  As directed    Follow with Primary MD Garlon HatchetMcConville, Robert, MD in 7 days   Get CBC, CMP checked  by Primary MD  in 5-7 days   Activity: As tolerated with Full fall precautions use walker/cane & assistance as needed  Disposition Home    Diet: Heart Healthy Low Carb,  Special Instructions: If you have smoked or chewed Tobacco  in the last 2 yrs please stop smoking, stop any regular Alcohol  and or any Recreational drug use.  On your next visit with your primary care physician please Get  Medicines reviewed and adjusted.  Please request your Prim.MD to go over all Hospital Tests and Procedure/Radiological results at the follow up, please get all Hospital records sent to your Prim MD by signing hospital release before you go home.  If you experience worsening of your admission symptoms, develop shortness of breath, life threatening emergency, suicidal or homicidal thoughts you must seek medical attention immediately by calling 911 or calling your MD immediately  if symptoms less severe.  You Must read complete instructions/literature along with all the possible adverse reactions/side effects for all the Medicines you take and that have been prescribed to you. Take any new Medicines after you have completely understood and accpet all the possible adverse reactions/side effects.   Increase activity slowly   Complete by:  As directed       Discharge Medications   Allergies as of 07/15/2018      Reactions   Penicillins Hives   Did it involve swelling of the face/tongue/throat, SOB, or low BP? No Did it involve sudden or severe rash/hives, skin peeling, or any reaction on the inside of your mouth or nose? Yes Did you need to seek medical attention at a hospital or doctor's office? No When did it last happen? If all above answers are "NO", may proceed with cephalosporin use.      Medication List    TAKE these medications   allopurinol 100 MG tablet Commonly known as:  ZYLOPRIM Take 100 mg by mouth 2 (two) times daily.   apixaban 5 MG Tabs tablet Commonly known as:  ELIQUIS Take 2 tablets (10 mg total) by mouth 2 (two) times daily. Discontinue after 06/03/2018 doses. What changed:  Another medication with the same name was changed. Make sure you understand how and when to take each.   apixaban 5 MG Tabs tablet Commonly known as:  ELIQUIS Take 1 tablet (5 mg total) by mouth 2 (two) times daily. To be started 06/04/2018. What changed:  additional instructions    atorvastatin 80 MG tablet Commonly known as:  LIPITOR Take 80 mg by mouth daily.   feeding supplement (GLUCERNA SHAKE) Liqd Take 237 mLs by mouth 3 (three) times daily between meals.   gabapentin 300 MG capsule Commonly known as:  NEURONTIN Take 1 capsule (300 mg total) by mouth 2 (two) times daily.   insulin aspart 100 UNIT/ML injection Commonly known as:  novoLOG Inject 0-9 Units into the skin 3 (three) times daily with meals. CBG < 70: implement hypoglycemia protocol CBG 70 - 120:  0 units CBG 121 - 150: 1 unit CBG 151 - 200: 2 units CBG 201 - 250: 3 units CBG 251 - 300: 5 units CBG 301 - 350: 7 units CBG 351 - 400: 9 units CBG > 400: call MD.   Insulin Glargine (1 Unit Dial) 300 UNIT/ML Sopn Commonly known as:  Toujeo SoloStar Inject 35 Units into the skin daily. What changed:  how much to take   metoprolol succinate 50 MG 24 hr tablet Commonly known as:  TOPROL-XL Take 50 mg by mouth daily.   multivitamin with minerals Tabs tablet Take 1 tablet by mouth daily.   omeprazole 40 MG capsule Commonly known as:  PRILOSEC Take 40 mg by mouth daily.   oxyCODONE-acetaminophen 10-325 MG tablet Commonly known as:  PERCOCET Take 1 tablet by mouth every 12 (twelve) hours as needed (Moderate - Severe pain.).   OZEMPIC (0.25 OR 0.5 MG/DOSE) [place] Inject 0.25 mg into the skin once a week.   polyethylene glycol packet Commonly known as:  MiraLax Take 17 g by mouth daily.   zolpidem 10 MG tablet Commonly known as:  AMBIEN Take 10 mg by mouth at bedtime as needed for sleep.       Follow-up Information    [name], MD. Schedule an appointment as soon as possible for a visit in 1 week(s).   Specialty:  Family Medicine          Major procedures and Radiology Reports - PLEASE review detailed and final reports thoroughly  -       No results found.  Micro Results     Recent Results (from the past 240 hour(s))  Urine culture     Status: Abnormal    Collection Time: 07/07/18  4:54 PM  Result Value Ref Range Status   Specimen Description URINE, CLEAN CATCH  Final   Special Requests NONE  Final   Culture (A)  Final    >=100,000 COLONIES/mL LACTOBACILLUS SPECIES >=100,000 COLONIES/mL ENTEROCOCCUS FAECALIS Standardized susceptibility testing for this organism is not available. FOR LACTOBACILLUS SPECIES Performed at [facility], [address] [place], [place] [address]    Report Status 07/10/2018 FINAL  Final   Organism ID, Bacteria ENTEROCOCCUS FAECALIS (A)  Final      Susceptibility   Enterococcus faecalis - MIC*    AMPICILLIN <=2 SENSITIVE Sensitive     LEVOFLOXACIN 1 SENSITIVE Sensitive     NITROFURANTOIN <=16 SENSITIVE Sensitive     VANCOMYCIN 1 SENSITIVE Sensitive     * >=100,000 COLONIES/mL ENTEROCOCCUS FAECALIS    Today   Subjective    Alyssa Haley today has no headache,no chest abdominal pain,no new weakness tingling or numbness, feels much better wants to go home today.     Objective   Blood pressure 103/66, pulse 79, temperature 98.8 F (37.1 C), temperature source Oral, resp. rate (!) 22, height  (1.651 m), weight 89.5 kg, SpO2 100 %.   Intake/Output Summary (Last 24 hours) at 07/15/2018 1610 Last data filed at 07/15/2018 1208 Gross per 24 hour  Intake 400 ml  Output -  Net 400 ml    Exam  Awake Alert,  No new F.N deficits, Normal affect [place].AT,PERRAL Supple Neck,No JVD, No cervical lymphadenopathy appriciated.  Symmetrical Chest wall movement, Good air movement bilaterally, CTAB RRR,No Gallops,Rubs or new Murmurs, No Parasternal Heave +ve B.Sounds, Abd Soft, Non tender, No organomegaly appriciated, No rebound -guarding or rigidity. No Cyanosis, Clubbing or edema, No new Rash or bruise  Data Review   CBC w Diff:  Lab Results  Component Value Date   WBC 8.5 07/14/2018   HGB 13.6 07/14/2018   HCT 42.0 07/14/2018   PLT 243 07/14/2018   LYMPHOPCT 64 07/08/2018   MONOPCT 8  07/08/2018   EOSPCT 2 07/08/2018   BASOPCT 1 07/08/2018    CMP:  Lab Results  Component Value Date   NA 136 07/14/2018   K 4.0 07/14/2018   CL 105 07/14/2018   CO2 24 07/14/2018   BUN 14 07/14/2018   CREATININE 0.84 07/14/2018   PROT 6.8 07/08/2018   ALBUMIN 2.9 (L) 07/08/2018   BILITOT 0.5 07/08/2018   ALKPHOS 87 07/08/2018   AST 15 07/08/2018   ALT 14 07/08/2018  .   Total Time in preparing paper work, data evaluation and todays exam - 35 minutes  Susa Raring M.D on 07/15/2018 at 4:10 PM  [facility]   Office  [phone]

## 2018-07-09 NOTE — Progress Notes (Addendum)
9:25am-CSW touched base with patient's daughter regarding discharge plan. Patient's daughter states that patient is confused (her sister is no longer living) and that she lives with her daughter. Patient's daughter states she cannot take patient home with her right now until patient is stronger. She requests patient to go back to [facility] if available. CSW awaiting response.  9:48am-Per [facility], since patient is in copay days, they require 2 weeks of copays up front ($[phone]) or patient would have to sign over her Social Security check minus  $242. CSW left voicemail for patient's daughter for decision.   Osborne Casco Ladarrion Telfair LCSW [phone]

## 2018-07-09 NOTE — Progress Notes (Signed)
CSW left another voicemail for patient's daughter, Ival Bible, to decide if she wants to pay copays for SNF.   Osborne Casco Barbera Perritt LCSW [phone]

## 2018-07-10 LAB — GLUCOSE, CAPILLARY
Glucose-Capillary: 152 mg/dL — ABNORMAL HIGH (ref 70–99)
Glucose-Capillary: 169 mg/dL — ABNORMAL HIGH (ref 70–99)
Glucose-Capillary: 174 mg/dL — ABNORMAL HIGH (ref 70–99)

## 2018-07-10 LAB — URINE CULTURE: Culture: >=100000 — AB

## 2018-07-10 MED ORDER — FOSFOMYCIN TROMETHAMINE 3 G PO PACK
3.0000 g | PACK | Freq: Once | ORAL | Status: AC
  Administered 2018-07-10: 3 g via ORAL
  Filled 2018-07-10: qty 3

## 2018-07-10 NOTE — Discharge Instructions (Signed)
Follow with Primary MD [name], MD in 7 days   Get CBC, CMP checked  by Primary MD  in 5-7 days   Activity: As tolerated with Full fall precautions use walker/cane & assistance as needed  Disposition Home    Diet: Heart Healthy Low Carb,  Special Instructions: If you have smoked or chewed Tobacco  in the last 2 yrs please stop smoking, stop any regular Alcohol  and or any Recreational drug use.  On your next visit with your primary care physician please Get Medicines reviewed and adjusted.  Please request your Prim.MD to go over all Hospital Tests and Procedure/Radiological results at the follow up, please get all Hospital records sent to your Prim MD by signing hospital release before you go home.  If you experience worsening of your admission symptoms, develop shortness of breath, life threatening emergency, suicidal or homicidal thoughts you must seek medical attention immediately by calling 911 or calling your MD immediately  if symptoms less severe.  You Must read complete instructions/literature along with all the possible adverse reactions/side effects for all the Medicines you take and that have been prescribed to you. Take any new Medicines after you have completely understood and accpet all the possible adverse reactions/side effects.   Information on my medicine - ELIQUIS (apixaban)  Why was Eliquis prescribed for you? Eliquis was prescribed for you to reduce the risk of forming blood clots that can cause a stroke if you have a medical condition called atrial fibrillation (a type of irregular heartbeat) OR to reduce the risk of a blood clots forming after orthopedic surgery.  What do You need to know about Eliquis ? Take your Eliquis TWICE DAILY - one tablet in the morning and one tablet in the evening with or without food.  It would be best to take the doses about the same time each day.  If you have difficulty swallowing the tablet whole please discuss with  your pharmacist how to take the medication safely.  Take Eliquis exactly as prescribed by your doctor and DO NOT stop taking Eliquis without talking to the doctor who prescribed the medication.  Stopping may increase your risk of developing a new clot or stroke.  Refill your prescription before you run out.  After discharge, you should have regular check-up appointments with your healthcare provider that is prescribing your Eliquis.  In the future your dose may need to be changed if your kidney function or weight changes by a significant amount or as you get older.  What do you do if you miss a dose? If you miss a dose, take it as soon as you remember on the same day and resume taking twice daily.  Do not take more than one dose of ELIQUIS at the same time.  Important Safety Information A possible side effect of Eliquis is bleeding. You should call your healthcare provider right away if you experience any of the following: ? Bleeding from an injury or your nose that does not stop. ? Unusual colored urine (red or dark brown) or unusual colored stools (red or black). ? Unusual bruising for unknown reasons. ? A serious fall or if you hit your head (even if there is no bleeding).  Some medicines may interact with Eliquis and might increase your risk of bleeding or clotting while on Eliquis. To help avoid this, consult your healthcare provider or pharmacist prior to using any new prescription or non-prescription medications, including herbals, vitamins, non-steroidal anti-inflammatory drugs (NSAIDs) and  supplements.  This website has more information on Eliquis (apixaban): www.[employer].com.cy.

## 2018-07-10 NOTE — Care Management Important Message (Signed)
Important Message  Patient Details  Name: Alyssa Haley MRN: [id number] Date of Birth: 1950/09/06   Medicare Important Message Given:  Yes    Brayam Boeke 07/10/2018, 2:55 PM

## 2018-07-10 NOTE — Progress Notes (Addendum)
2pm-CSW contacted daughter again with no success.  9:59am-CSW left another voicemail for patient's daughter regarding discharge plan.   Osborne Casco Tonetta Napoles LCSW [phone]

## 2018-07-11 LAB — GLUCOSE, CAPILLARY
GLUCOSE-CAPILLARY: 86 mg/dL (ref 70–99)
Glucose-Capillary: 115 mg/dL — ABNORMAL HIGH (ref 70–99)
Glucose-Capillary: 186 mg/dL — ABNORMAL HIGH (ref 70–99)
Glucose-Capillary: 177 mg/dL — ABNORMAL HIGH (ref 70–99)

## 2018-07-11 MED ORDER — CEPHALEXIN 500 MG PO CAPS
500.0000 mg | ORAL_CAPSULE | Freq: Two times a day (BID) | ORAL | Status: AC
  Administered 2018-07-11 – 2018-07-13 (×6): 500 mg via ORAL
  Filled 2018-07-11 (×5): qty 1

## 2018-07-11 NOTE — Progress Notes (Signed)
CSW left another voicemail for patient's daughter stating that patient has been discharge. If no return call, CSW will call for a police well check.   Osborne Casco China Deitrick LCSW [phone]

## 2018-07-11 NOTE — Progress Notes (Signed)
[facility]                                                                                                                                                                         Patient Demographics  Alyssa Haley, is a 68 y.o. female  TGR:[id number]  [id number]:[id number]  DOB - May 02, 1951  Admit date - 07/07/2018  Admitting Physician Bobette Mo, MD  Outpatient Primary MD for the patient is [name], MD  LOS - 4   Chief Complaint  Patient presents with  . Urinary Tract Infection        Assessment & Plan    Patient seen briefly today due for discharge soon per Discharge done yesterday by me, no further issues, Vital signs stable, patient feels fine.      Medications  Scheduled Meds: . allopurinol  100 mg Oral BID  . apixaban  5 mg Oral BID  . atorvastatin  80 mg Oral Daily  . gabapentin  300 mg Oral BID  . insulin aspart  0-15 Units Subcutaneous TID WC  . insulin glargine  30 Units Subcutaneous Daily  . metoprolol succinate  50 mg Oral Daily  . pantoprazole  40 mg Oral Daily   Continuous Infusions: . cefTRIAXone (ROCEPHIN)  IV Stopped (07/10/18 1900)   PRN Meds:.acetaminophen **OR** [DISCONTINUED] acetaminophen, ondansetron **OR** ondansetron (ZOFRAN) IV, oxyCODONE, zolpidem    Time Spent in minutes   10 minutes   Susa Raring M.D on 07/11/2018 at 8:15 AM  Between 7am to 7pm - Pager - [phone]  After 7pm go to [email or web address] - password [email or web address]  And look for the night coverage person covering for me after hours  [facility] Office  [phone]    Subjective:   Shauntavia Eckland today has, No headache, No chest pain, No abdominal pain - No Nausea, No new weakness tingling or numbness, No Cough - SOB.    Objective:   Vitals:   07/10/18 0514 07/10/18 1500 07/10/18 2036 07/11/18 0610  BP: 125/73 128/75 124/68 (!) 107/94  Pulse: 72 78 80 72  Resp:  17 20 18 16   Temp: 98.8 F (37.1 C) 98.3 F (36.8 C) 98.4 F (36.9 C) 98.2 F (36.8 C)  TempSrc: Oral Oral Oral Oral  SpO2: 100% 100% 100% 98%  Weight:      Height:        Wt Readings from Last 3 Encounters:  07/07/18 89.5 kg  05/28/18 89.7 kg  12/28/17 99.8 kg     Intake/Output Summary (Last 24 hours) at 07/11/2018 0815 Last data filed at 07/11/2018 0610 Gross per 24 hour  Intake 1200 ml  Output 1400 ml  Net -200 ml    Exam Awake Alert,   No  new F.N deficits, Normal affect [place].AT,PERRAL Supple Neck,No JVD, No cervical lymphadenopathy appriciated.  Symmetrical Chest wall movement, Good air movement bilaterally, CTAB RRR,No Gallops,Rubs or new Murmurs, No Parasternal Heave +ve B.Sounds, Abd Soft, Non tender, No organomegaly appriciated, No rebound - guarding or rigidity. No Cyanosis, Clubbing or edema, No new Rash or bruise     Data Review

## 2018-07-11 NOTE — Plan of Care (Signed)
  Problem: Education: Goal: Knowledge of General Education information will improve Description Including pain rating scale, medication(s)/side effects and non-pharmacologic comfort measures Outcome: Progressing Note:  POC reviewed with pt.; forgetful at times.

## 2018-07-11 NOTE — Progress Notes (Signed)
PT Cancellation Note  Patient Details Name: Alyssa Haley MRN: [id number] DOB: Sep 17, 1950   Cancelled Treatment:    Reason Eval/Treat Not Completed: Other (comment).  Pt is discharged to go home.   Ivar Drape 07/11/2018, 1:48 PM   Samul Dada, PT MS Acute Rehab Dept. Number: [facility] [id number] and [facility] [phone]

## 2018-07-12 LAB — GLUCOSE, CAPILLARY
GLUCOSE-CAPILLARY: 231 mg/dL — AB (ref 70–99)
Glucose-Capillary: 120 mg/dL — ABNORMAL HIGH (ref 70–99)
Glucose-Capillary: 151 mg/dL — ABNORMAL HIGH (ref 70–99)
Glucose-Capillary: 204 mg/dL — ABNORMAL HIGH (ref 70–99)

## 2018-07-12 NOTE — Progress Notes (Cosign Needed)
Patient is alert and orient x 3. However, with certain questions the patient answers are inappropriate. Patient has a beginning stage 3 ulcer, 1.5cm length, 3 cm width. The wound was cleaned, protective barrier applied, and covered with a dressing. The dressing should be changed in 3 days. The patient remained free of injury throughout my shift. Awaiting WOC consult.

## 2018-07-12 NOTE — Progress Notes (Signed)
[facility]                                                                                                                                                                         Patient Demographics  Alyssa Haley, is a 68 y.o. female  ASN:[id number]  [id number]:[id number]  DOB - 03-May-1951  Admit date - 07/07/2018  Admitting Physician Bobette Mo, MD  Outpatient Primary MD for the patient is [name], MD  LOS - 5   Chief Complaint  Patient presents with  . Urinary Tract Infection        Assessment & Plan    Patient seen briefly today due for discharge soon per Discharge done yesterday by me, no further issues, Vital signs stable, patient feels fine.      Medications  Scheduled Meds: . allopurinol  100 mg Oral BID  . apixaban  5 mg Oral BID  . atorvastatin  80 mg Oral Daily  . cephALEXin  500 mg Oral Q12H  . gabapentin  300 mg Oral BID  . insulin aspart  0-15 Units Subcutaneous TID WC  . insulin glargine  30 Units Subcutaneous Daily  . metoprolol succinate  50 mg Oral Daily  . pantoprazole  40 mg Oral Daily   Continuous Infusions:  PRN Meds:.acetaminophen **OR** [DISCONTINUED] acetaminophen, ondansetron **OR** ondansetron (ZOFRAN) IV, oxyCODONE, zolpidem    Time Spent in minutes   10 minutes   Susa Raring M.D on 07/12/2018 at 8:20 AM  Between 7am to 7pm - Pager - [phone]  After 7pm go to [email or web address] - password [email or web address]  And look for the night coverage person covering for me after hours  [facility] Office  [phone]    Subjective:   [name] today has, No headache, No chest pain, No abdominal pain - No Nausea, No new weakness tingling or numbness, No Cough - SOB.    Objective:   Vitals:   07/11/18 0610 07/11/18 1426 07/11/18 2133 07/12/18 0525  BP: (!) 107/94 125/79 134/72 (!) 142/92  Pulse: 72 74 67 64  Resp: 16 16 16 16    Temp: 98.2 F (36.8 C) 98.1 F (36.7 C) 98.8 F (37.1 C) (!) 97.5 F (36.4 C)  TempSrc: Oral Oral Oral Oral  SpO2: 98% 99% 98% 100%  Weight:      Height:        Wt Readings from Last 3 Encounters:  07/07/18 89.5 kg  05/28/18 89.7 kg  12/28/17 99.8 kg     Intake/Output Summary (Last 24 hours) at 07/12/2018 0820 Last data filed at 07/11/2018 1043 Gross per 24 hour  Intake 240 ml  Output -  Net 240 ml    Exam Awake Alert,   No  new F.N deficits, Normal affect [place].AT,PERRAL Supple Neck,No JVD, No cervical lymphadenopathy appriciated.  Symmetrical Chest wall movement, Good air movement bilaterally, CTAB RRR,No Gallops,Rubs or new Murmurs, No Parasternal Heave +ve B.Sounds, Abd Soft, Non tender, No organomegaly appriciated, No rebound - guarding or rigidity. No Cyanosis, Clubbing or edema, No new Rash or bruise     Data Review

## 2018-07-12 NOTE — Progress Notes (Signed)
Attempted to contact daughter and left voicemail for callback in regards to patients disposition. As requested by primary SW, contacted law enforcement for wellness check for daughter. Awaiting callback. Will continue to assist as needed.

## 2018-07-13 LAB — GLUCOSE, CAPILLARY
Glucose-Capillary: 136 mg/dL — ABNORMAL HIGH (ref 70–99)
Glucose-Capillary: 195 mg/dL — ABNORMAL HIGH (ref 70–99)
Glucose-Capillary: 249 mg/dL — ABNORMAL HIGH (ref 70–99)
Glucose-Capillary: 190 mg/dL — ABNORMAL HIGH (ref 70–99)

## 2018-07-13 NOTE — Progress Notes (Signed)
[facility]                                                                                                                                                                         Patient Demographics  Alyssa Haley, is a 68 y.o. female  OZD:[id number]  QVZ:[id number]  DOB - [birth year]  Admit date - 07/07/2018  Admitting Physician Bobette Mo, MD  Outpatient Primary MD for the patient is [name], MD  LOS - 6   Chief Complaint  Patient presents with  . Urinary Tract Infection        Assessment & Plan    Patient seen briefly today due for discharge soon per Discharge done on 07/09/18 by me, no further issues, Vital signs stable, patient feels fine.      Medications  Scheduled Meds: . allopurinol  100 mg Oral BID  . apixaban  5 mg Oral BID  . atorvastatin  80 mg Oral Daily  . cephALEXin  500 mg Oral Q12H  . gabapentin  300 mg Oral BID  . insulin aspart  0-15 Units Subcutaneous TID WC  . insulin glargine  30 Units Subcutaneous Daily  . metoprolol succinate  50 mg Oral Daily  . pantoprazole  40 mg Oral Daily   Continuous Infusions:  PRN Meds:.acetaminophen **OR** [DISCONTINUED] acetaminophen, ondansetron **OR** ondansetron (ZOFRAN) IV, oxyCODONE, zolpidem    Time Spent in minutes   10 minutes   Susa Raring M.D on 07/13/2018 at 8:44 AM  Between 7am to 7pm - Pager - [phone]  After 7pm go to [email or web address] - password [email or web address]  And look for the night coverage person covering for me after hours  [facility] Office  [phone]    Subjective:   Patient in bed, appears comfortable, denies any headache, no fever, no chest pain or pressure, no shortness of breath , no abdominal pain. No focal weakness.   Objective:   Vitals:   07/12/18 1506 07/12/18 2059 07/13/18 0509 07/13/18 0839  BP: (!) 142/102 (!) 158/87 133/82 (!) 145/83  Pulse: 80 71 74 85   Resp: 19 16 16    Temp: 98.3 F (36.8 C) 98.3 F (36.8 C) 98 F (36.7 C) 98.1 F (36.7 C)  TempSrc: Oral  Oral Oral  SpO2: 100% 100% 99% 100%  Weight:      Height:        Wt Readings from Last 3 Encounters:  07/07/18 89.5 kg  05/28/18 89.7 kg  12/28/17 99.8 kg     Intake/Output Summary (Last 24 hours) at 07/13/2018 0844 Last data filed at 07/13/2018 0512 Gross per 24 hour  Intake -  Output 1700 ml  Net -1700 ml    Exam Awake Alert,  No new F.N deficits, Normal affect [place].AT,PERRAL Supple Neck,No JVD, No cervical lymphadenopathy appriciated.  Symmetrical Chest wall movement, Good air movement bilaterally, CTAB RRR,No Gallops,Rubs or new Murmurs, No Parasternal Heave +ve B.Sounds, Abd Soft, Non tender, No organomegaly appriciated, No rebound - guarding or rigidity. No Cyanosis, Clubbing or edema, No new Rash or bruise     Data Review

## 2018-07-13 NOTE — Progress Notes (Signed)
Patient has refused all turns this shift. Educated on benefits of turning in preventing wounds, continues to refuse.

## 2018-07-14 LAB — GLUCOSE, CAPILLARY
Glucose-Capillary: 251 mg/dL — ABNORMAL HIGH (ref 70–99)
Glucose-Capillary: 279 mg/dL — ABNORMAL HIGH (ref 70–99)
Glucose-Capillary: 274 mg/dL — ABNORMAL HIGH (ref 70–99)
Glucose-Capillary: 249 mg/dL — ABNORMAL HIGH (ref 70–99)
Glucose-Capillary: 196 mg/dL — ABNORMAL HIGH (ref 70–99)
Glucose-Capillary: 160 mg/dL — ABNORMAL HIGH (ref 70–99)
Glucose-Capillary: 84 mg/dL (ref 70–99)

## 2018-07-14 LAB — BASIC METABOLIC PANEL
Anion gap: 7 (ref 5–15)
BUN: 14 mg/dL (ref 8–23)
CO2: 24 mmol/L (ref 22–32)
Calcium: 9.2 mg/dL (ref 8.9–10.3)
Chloride: 105 mmol/L (ref 98–111)
Creatinine, Ser: 0.84 mg/dL (ref 0.44–1.00)
GFR calc Af Amer: >60 mL/min (ref >60)
GFR calc non Af Amer: >60 mL/min (ref >60)
Glucose, Bld: 167 mg/dL — ABNORMAL HIGH (ref 70–99)
Potassium: 4 mmol/L (ref 3.5–5.1)
Sodium: 136 mmol/L (ref 135–145)

## 2018-07-14 LAB — CBC
HCT: 42 % (ref 36.0–46.0)
Hemoglobin: 13.6 g/dL (ref 12.0–15.0)
MCH: 26.8 pg (ref 26.0–34.0)
MCHC: 32.4 g/dL (ref 30.0–36.0)
MCV: 82.7 fL (ref 80.0–100.0)
Platelets: 243 10*3/uL (ref 150–400)
RBC: 5.08 MIL/uL (ref 3.87–5.11)
RDW: 15.4 % (ref 11.5–15.5)
WBC: 8.5 10*3/uL (ref 4.0–10.5)
nRBC: 0 % (ref 0.0–0.2)

## 2018-07-14 NOTE — Consult Note (Signed)
WOC Nurse wound consult note Reason for Consult: sacral pressure injury Patient is wheel chair bound per review of the chart. Patient is awake and conversant but not accurate with history.  No family in the patient's room Wound type: Stage 3 pressure injury x 2  Pressure Injury POA: Yes Measurement: 2.0cm x 1.0cm x 0.2cm and to the right of this larger one 0.3cm x 0.2cm x 0.1cm  Wound bed: Pink, clean, moist Drainage (amount, consistency, odor) unable to assess, dressing is wet with urine Periwound: intact  Dressing procedure/placement/frequency: Silicone foam folded and placed deep into the patient's gluteal cleft to allow for wound to touch the wound bed, if incontinence is an issue, switch to barrier cream only. Turn patient from side to side. Use Purewick to manage incontinence making sure the device in assessed for placement every 2 hours   Discussed POC with bedside nurse.  Re consult if needed, will not follow at this time. Thanks  Lathyn Griggs [employer], RN,CWOCN, CNS, CWON-AP [phone])

## 2018-07-14 NOTE — Care Management Important Message (Signed)
Important Message  Patient Details  Name: Alyssa Haley MRN: [id number] Date of Birth: 1950-09-17   Medicare Important Message Given:  Yes    Sharunda Salmon Stefan Church 07/14/2018, 4:37 PM

## 2018-07-14 NOTE — Progress Notes (Signed)
Per [employer], they were unable to locate patient's daughter and address listed on patient's facesheet is incorrect.   Alyssa Casco Shloime Keilman LCSW [phone]

## 2018-07-14 NOTE — Care Management Important Message (Signed)
Important Message  Patient Details  Name: Alyssa Haley MRN: [id number] Date of Birth: December 25, 1950   Medicare Important Message Given:  Yes    Haig Gerardo Stefan Church 07/14/2018, 4:36 PM

## 2018-07-14 NOTE — Progress Notes (Signed)
[facility]                                                                                                                                                                         Patient Demographics  Alyssa Haley, is a 68 y.o. female  WCB:[id number]  [id number]:[id number]  DOB - [birth year]  Admit date - 07/07/2018  Admitting Physician Bobette Mo, MD  Outpatient Primary MD for the patient is [name], MD  LOS - 7   Chief Complaint  Patient presents with  . Urinary Tract Infection        Assessment & Plan    Patient seen briefly today due for discharge soon per Discharge done on 07/09/18 by me, no further issues, Vital signs stable, patient feels fine and eating her breakfast.   Medications  Scheduled Meds: . allopurinol  100 mg Oral BID  . apixaban  5 mg Oral BID  . atorvastatin  80 mg Oral Daily  . gabapentin  300 mg Oral BID  . insulin aspart  0-15 Units Subcutaneous TID WC  . insulin glargine  30 Units Subcutaneous Daily  . metoprolol succinate  50 mg Oral Daily  . pantoprazole  40 mg Oral Daily   Continuous Infusions:  PRN Meds:.acetaminophen **OR** [DISCONTINUED] acetaminophen, ondansetron **OR** ondansetron (ZOFRAN) IV, oxyCODONE, zolpidem    Time Spent in minutes   10 minutes   Susa Raring M.D on 07/14/2018 at 8:22 AM  Between 7am to 7pm - Pager - [phone]  After 7pm go to [email or web address] - password [email or web address]  And look for the night coverage person covering for me after hours  [facility] Office  [phone]    Subjective:   Patient in bed, appears comfortable, denies any headache, no fever, no chest pain or pressure, no shortness of breath , no abdominal pain. No focal weakness.   Objective:   Vitals:   07/13/18 0839 07/13/18 1558 07/13/18 2129 07/14/18 0515  BP: (!) 145/83 125/74 (!) 157/91 128/81  Pulse: 85 67 75 81  Resp:   18 18   Temp: 98.1 F (36.7 C) 98.5 F (36.9 C) 98.5 F (36.9 C) 98.3 F (36.8 C)  TempSrc: Oral Oral Oral   SpO2: 100% 100% 91% 100%  Weight:      Height:        Wt Readings from Last 3 Encounters:  07/07/18 89.5 kg  05/28/18 89.7 kg  12/28/17 99.8 kg     Intake/Output Summary (Last 24 hours) at 07/14/2018 9371 Last data filed at 07/14/2018 0531 Gross per 24 hour  Intake 240 ml  Output 900 ml  Net -660 ml    Exam Awake Alert,   No new F.N deficits, Normal affect [place].AT,PERRAL  Supple Neck,No JVD, No cervical lymphadenopathy appriciated.  Symmetrical Chest wall movement, Good air movement bilaterally, CTAB RRR,No Gallops,Rubs or new Murmurs, No Parasternal Heave +ve B.Sounds, Abd Soft, Non tender, No organomegaly appriciated, No rebound - guarding or rigidity. No Cyanosis, Clubbing or edema, No new Rash or bruise     Data Review

## 2018-07-14 NOTE — Progress Notes (Signed)
Patient's daughter contacted CSW and stated she is on her way back from [place] from a family emergency. Her address: [address] [place] [address]. She states that she is not sure what to do because the patient has bills to pay and cannot afford to sign her check over to SNF. CSW let her know that patient has been discharged and is accruing a bill. She states she will contact her siblings to see if they can help.  Osborne Casco Zykira Matlack LCSW [phone]

## 2018-07-14 NOTE — Progress Notes (Signed)
Physical Therapy Treatment Patient Details Name: Alyssa Haley MRN: [id number] DOB: [birth year] Today's Date: 07/14/2018    History of Present Illness Pt is a 68 y/o female admitted secondary to increased lethargy. Found to have sepsis from UTI. PMH includes CAD, gout, HTN, and DM.     PT Comments    Pt got into chair today - she kept verbalizing to leave her alone and she would do it - but then she wouldn't do it.  Pt needed 3 attempts and +2 max assist to get her from elevated bed to recliner chair.  Pt incontinent of urine during transfer.  Pt will need a lot of help at DC.  I explained plan to pt.  Pt will need lift to get back to bed.   Follow Up Recommendations  SNF;Supervision/Assistance - 24 hour     Equipment Recommendations  None recommended by PT    Recommendations for Other Services       Precautions / Restrictions Precautions Precautions: Fall Restrictions Weight Bearing Restrictions: No    Mobility  Bed Mobility Overal bed mobility: Needs Assistance Bed Mobility: Rolling;Supine to Sit Rolling: Mod assist   Supine to sit: Max assist     General bed mobility comments: pt needed max cues and HOB up and rail to get to sitting wiht max assist.  pt would attempt to scoot forward but unable to move herself.  she needed me to pull the chuck to help her get scooted out  Transfers Overall transfer level: Needs assistance   Transfers: Sit to/from Stand;Stand Pivot Transfers Sit to Stand: +2 physical assistance;From elevated surface;Max assist Stand pivot transfers: Max assist;+2 physical assistance;From elevated surface       General transfer comment: Pt was irritated and wouldnt help -  kept saying to leave her alone and step back and she would do it herself (but she wasnt attempting it).  Pt needed 3 attempts to get high enough to get onto her feet to be able to turn and pivot to chair with +2 max assist.  Pt was incontinent of urine during transfer.  I  explained to pt how she is going to have to work to get stronger prior to going home (she keeps saying she is going home)_  Ambulation/Gait             General Gait Details: unable - pt was wc bound prior to admit per MD report   Stairs             Wheelchair Mobility    Modified Rankin (Stroke Patients Only)       Balance Overall balance assessment: Needs assistance     Sitting balance - Comments: once positioned pt abel to sit EOB       Standing balance comment: 3 attempts to get pt standing tall enough to be able to transfer.  pt with flexed standing and difficulty getting her weight up over her feet                            Cognition Arousal/Alertness: Lethargic Behavior During Therapy: Flat affect Overall Cognitive Status: No family/caregiver present to determine baseline cognitive functioning                                 General Comments: Pt remains slow to verbalize unless  upset by something.  she became irritated that someone was  helping her to stand up - she kept saying she wanted to do it on her own but wouldnt do anything.  pt incontinent of urine during transfer      Exercises      General Comments General comments (skin integrity, edema, etc.): no family present during session.      Pertinent Vitals/Pain Pain Assessment: No/denies pain    Home Living                      Prior Function            PT Goals (current goals can now be found in the care plan section) Progress towards PT goals: Progressing toward goals    Frequency    Min 2X/week      PT Plan Current plan remains appropriate    Co-evaluation              AM-PAC PT "6 Clicks" Mobility   Outcome Measure  Help needed turning from your back to your side while in a flat bed without using bedrails?: Total Help needed moving from lying on your back to sitting on the side of a flat bed without using bedrails?: Total Help  needed moving to and from a bed to a chair (including a wheelchair)?: Total Help needed standing up from a chair using your arms (e.g., wheelchair or bedside chair)?: Total Help needed to walk in hospital room?: Total Help needed climbing 3-5 steps with a railing? : Total 6 Click Score: 6    End of Session Equipment Utilized During Treatment: Gait belt Activity Tolerance: Patient tolerated treatment well;Treatment limited secondary to agitation Patient left: in chair;with call bell/phone within reach;with chair alarm set Nurse Communication: Mobility status;Need for lift equipment PT Visit Diagnosis: Muscle weakness (generalized) (M62.81);Difficulty in walking, not elsewhere classified (R26.2)     Time: 1035-1100 PT Time Calculation (min) (ACUTE ONLY): 25 min  Charges:  $Therapeutic Exercise: 23-37 mins                    07/14/2018   Ranae Palms, PT    Judson Roch 07/14/2018, 11:08 AM

## 2018-07-15 LAB — GLUCOSE, CAPILLARY
GLUCOSE-CAPILLARY: 209 mg/dL — AB (ref 70–99)
Glucose-Capillary: 76 mg/dL (ref 70–99)
Glucose-Capillary: 150 mg/dL — ABNORMAL HIGH (ref 70–99)
Glucose-Capillary: 143 mg/dL — ABNORMAL HIGH (ref 70–99)

## 2018-07-15 MED ORDER — INSULIN GLARGINE 100 UNIT/ML ~~LOC~~ SOLN
20.0000 [IU] | Freq: Once | SUBCUTANEOUS | Status: AC
  Administered 2018-07-15: 20 [IU] via SUBCUTANEOUS
  Filled 2018-07-15: qty 0.2

## 2018-07-15 NOTE — Progress Notes (Signed)
[facility]                                                                                                                                                                         Patient Demographics  Alyssa Haley, is a 68 y.o. female  NWG:[id number]  [id number]:[id number]  DOB - [birth year]  Admit date - 07/07/2018  Admitting Physician Bobette Mo, MD  Outpatient Primary MD for the patient is [name], MD  LOS - 8   Chief Complaint  Patient presents with  . Urinary Tract Infection        Assessment & Plan    Patient seen briefly today due for discharge soon per Discharge done on 07/09/18 by me, no further issues, Vital signs stable, patient feels fine and eating her breakfast.   Medications  Scheduled Meds: . allopurinol  100 mg Oral BID  . apixaban  5 mg Oral BID  . atorvastatin  80 mg Oral Daily  . gabapentin  300 mg Oral BID  . insulin aspart  0-15 Units Subcutaneous TID WC  . insulin glargine  30 Units Subcutaneous Daily  . metoprolol succinate  50 mg Oral Daily  . pantoprazole  40 mg Oral Daily   Continuous Infusions:  PRN Meds:.acetaminophen **OR** [DISCONTINUED] acetaminophen, ondansetron **OR** ondansetron (ZOFRAN) IV, oxyCODONE, zolpidem    Time Spent in minutes   10 minutes   Susa Raring M.D on 07/15/2018 at 8:32 AM  Between 7am to 7pm - Pager - [phone]  After 7pm go to [email or web address] - password [email or web address]  And look for the night coverage person covering for me after hours  [facility] Office  [phone]    Subjective:   Patient in bed, appears comfortable, denies any headache, no fever, no chest pain or pressure, no shortness of breath , no abdominal pain. No focal weakness.   Objective:   Vitals:   07/14/18 0917 07/14/18 1548 07/14/18 2250 07/15/18 0549  BP: (!) 159/85 119/82 111/63 102/73  Pulse: 98 84 71 79  Resp:  19 16 16    Temp: 98 F (36.7 C) 98.9 F (37.2 C) 97.8 F (36.6 C) 98 F (36.7 C)  TempSrc: Oral Oral Oral Oral  SpO2: 100% 100% 100% 100%  Weight:      Height:        Wt Readings from Last 3 Encounters:  07/07/18 89.5 kg  05/28/18 89.7 kg  12/28/17 99.8 kg     Intake/Output Summary (Last 24 hours) at 07/15/2018 4742 Last data filed at 07/14/2018 2120 Gross per 24 hour  Intake 120 ml  Output -  Net 120 ml    Exam Awake Alert,   No new F.N deficits, Normal affect [place].AT,PERRAL Supple Neck,No  JVD, No cervical lymphadenopathy appriciated.  Symmetrical Chest wall movement, Good air movement bilaterally, CTAB RRR,No Gallops,Rubs or new Murmurs, No Parasternal Heave +ve B.Sounds, Abd Soft, Non tender, No organomegaly appriciated, No rebound - guarding or rigidity. No Cyanosis, Clubbing or edema, No new Rash or bruise     Data Review

## 2018-07-15 NOTE — Progress Notes (Signed)
CSW and RNCM spoke with patient's daughter. She reports that she is unable to pay for SNF at this time. She requests that we send the patient home with home health. RNCM provided chioce and [facility] will see patient. Daughter declined needing equipment and states she will be at home to receive patient by [facility]. She is contacting [facility] to see if they will resume home services for patient.    Osborne Casco Cullen Vanallen LCSW [phone]

## 2018-07-15 NOTE — Progress Notes (Signed)
Patient will DC to: Home Anticipated DC date: 07/15/18 Family notified: Daughter, Ival Bible Transport HT:MBPJ 6pm   Per MD patient ready for DC to Home. RN, patient, patient's family notified of DC.  DC packet on chart. Ambulance transport requested for patient.   CSW will sign off for now as social work intervention is no longer needed. Please consult [place] again if new needs arise.  Cristobal Goldmann, LCSW Clinical Social Worker [phone]

## 2018-07-15 NOTE — Progress Notes (Signed)
1904: Call to daughter Ival Bible. States her son will be home to let patient into home. Patient discharging via EMS. Personal belonging sent with patient.

## 2018-07-17 ENCOUNTER — Encounter (HOSPITAL_COMMUNITY): Payer: Self-pay | Admitting: Obstetrics and Gynecology

## 2018-07-17 ENCOUNTER — Inpatient Hospital Stay (HOSPITAL_COMMUNITY)
Admission: EM | Admit: 2018-07-17 | Discharge: 2018-07-22 | DRG: 872 | Disposition: A | Discharge status: Skilled Nursing Facility | Payer: Medicare Other | Attending: Internal Medicine | Admitting: Internal Medicine

## 2018-07-17 ENCOUNTER — Emergency Department (HOSPITAL_COMMUNITY): Payer: Medicare Other

## 2018-07-17 ENCOUNTER — Other Ambulatory Visit: Payer: Self-pay

## 2018-07-17 DIAGNOSIS — Z87891 Personal history of nicotine dependence: Secondary | ICD-10-CM | POA: Diagnosis not present

## 2018-07-17 DIAGNOSIS — A419 Sepsis, unspecified organism: Secondary | ICD-10-CM | POA: Diagnosis present

## 2018-07-17 DIAGNOSIS — I2510 Atherosclerotic heart disease of native coronary artery without angina pectoris: Secondary | ICD-10-CM | POA: Diagnosis present

## 2018-07-17 DIAGNOSIS — R32 Unspecified urinary incontinence: Secondary | ICD-10-CM | POA: Diagnosis present

## 2018-07-17 DIAGNOSIS — M109 Gout, unspecified: Secondary | ICD-10-CM | POA: Diagnosis present

## 2018-07-17 DIAGNOSIS — Z87440 Personal history of urinary (tract) infections: Secondary | ICD-10-CM

## 2018-07-17 DIAGNOSIS — L89152 Pressure ulcer of sacral region, stage 2: Secondary | ICD-10-CM | POA: Diagnosis present

## 2018-07-17 DIAGNOSIS — I10 Essential (primary) hypertension: Secondary | ICD-10-CM | POA: Diagnosis present

## 2018-07-17 DIAGNOSIS — Z955 Presence of coronary angioplasty implant and graft: Secondary | ICD-10-CM | POA: Diagnosis not present

## 2018-07-17 DIAGNOSIS — B377 Candidal sepsis: Secondary | ICD-10-CM | POA: Diagnosis not present

## 2018-07-17 DIAGNOSIS — Z96659 Presence of unspecified artificial knee joint: Secondary | ICD-10-CM | POA: Diagnosis present

## 2018-07-17 DIAGNOSIS — E785 Hyperlipidemia, unspecified: Secondary | ICD-10-CM | POA: Diagnosis present

## 2018-07-17 DIAGNOSIS — E876 Hypokalemia: Secondary | ICD-10-CM | POA: Diagnosis present

## 2018-07-17 DIAGNOSIS — B3749 Other urogenital candidiasis: Secondary | ICD-10-CM | POA: Diagnosis present

## 2018-07-17 DIAGNOSIS — L8990 Pressure ulcer of unspecified site, unspecified stage: Secondary | ICD-10-CM | POA: Diagnosis present

## 2018-07-17 DIAGNOSIS — N390 Urinary tract infection, site not specified: Secondary | ICD-10-CM | POA: Diagnosis not present

## 2018-07-17 DIAGNOSIS — K219 Gastro-esophageal reflux disease without esophagitis: Secondary | ICD-10-CM | POA: Diagnosis present

## 2018-07-17 DIAGNOSIS — E1165 Type 2 diabetes mellitus with hyperglycemia: Secondary | ICD-10-CM | POA: Diagnosis present

## 2018-07-17 LAB — COMPREHENSIVE METABOLIC PANEL
ALT: 28 U/L (ref 0–44)
AST: 26 U/L (ref 15–41)
Albumin: 3.7 g/dL (ref 3.5–5.0)
Alkaline Phosphatase: 119 U/L (ref 38–126)
Anion gap: 8 (ref 5–15)
BUN: 20 mg/dL (ref 8–23)
CO2: 27 mmol/L (ref 22–32)
Calcium: 9.5 mg/dL (ref 8.9–10.3)
Chloride: 104 mmol/L (ref 98–111)
Creatinine, Ser: 0.91 mg/dL (ref 0.44–1.00)
GFR calc non Af Amer: >60 mL/min (ref >60)
Glucose, Bld: 203 mg/dL — ABNORMAL HIGH (ref 70–99)
Potassium: 4.2 mmol/L (ref 3.5–5.1)
Sodium: 139 mmol/L (ref 135–145)
TOTAL PROTEIN: 8.4 g/dL — AB (ref 6.5–8.1)
Total Bilirubin: 1.1 mg/dL (ref 0.3–1.2)

## 2018-07-17 LAB — CBC WITH DIFFERENTIAL/PLATELET
Abs Immature Granulocytes: 0.06 10*3/uL (ref 0.00–0.07)
BASOS PCT: 2 %
Basophils Absolute: 0.1 10*3/uL (ref 0.0–0.1)
Eosinophils Absolute: 0.1 10*3/uL (ref 0.0–0.5)
Eosinophils Relative: 1 %
HCT: 46.8 % — ABNORMAL HIGH (ref 36.0–46.0)
Hemoglobin: 13.8 g/dL (ref 12.0–15.0)
Immature Granulocytes: 1 %
Lymphocytes Relative: 38 %
Lymphs Abs: 3.1 10*3/uL (ref 0.7–4.0)
MCH: 26 pg (ref 26.0–34.0)
MCHC: 29.5 g/dL — ABNORMAL LOW (ref 30.0–36.0)
MCV: 88.3 fL (ref 80.0–100.0)
Monocytes Absolute: 0.6 10*3/uL (ref 0.1–1.0)
Monocytes Relative: 8 %
Neutro Abs: 4.3 10*3/uL (ref 1.7–7.7)
Neutrophils Relative %: 50 %
Platelets: 246 10*3/uL (ref 150–400)
RBC: 5.3 MIL/uL — ABNORMAL HIGH (ref 3.87–5.11)
RDW: 15.6 % — ABNORMAL HIGH (ref 11.5–15.5)
WBC: 8.3 10*3/uL (ref 4.0–10.5)
nRBC: 0 % (ref 0.0–0.2)

## 2018-07-17 LAB — URINALYSIS, ROUTINE W REFLEX MICROSCOPIC
Bilirubin Urine: NEGATIVE
Glucose, UA: 50 mg/dL — AB
Ketones, ur: NEGATIVE mg/dL
NITRITE: NEGATIVE
Protein, ur: 100 mg/dL — AB
RBC / HPF: >50 RBC/hpf — ABNORMAL HIGH (ref 0–5)
Specific Gravity, Urine: 1.015 (ref 1.005–1.030)
WBC, UA: >50 WBC/hpf — ABNORMAL HIGH (ref 0–5)
pH: 5 (ref 5.0–8.0)

## 2018-07-17 LAB — CK: Total CK: 44 U/L (ref 38–234)

## 2018-07-17 LAB — LACTIC ACID, PLASMA
Lactic Acid, Venous: 2.1 mmol/L (ref 0.5–1.9)
Lactic Acid, Venous: 2.3 mmol/L (ref 0.5–1.9)

## 2018-07-17 LAB — INFLUENZA PANEL BY PCR (TYPE A & B)
Influenza A By PCR: NEGATIVE
Influenza B By PCR: NEGATIVE

## 2018-07-17 LAB — I-STAT TROPONIN, ED: Troponin i, poc: 0.01 ng/mL (ref 0.00–0.08)

## 2018-07-17 MED ORDER — CIPROFLOXACIN IN D5W 400 MG/200ML IV SOLN
400.0000 mg | Freq: Once | INTRAVENOUS | Status: AC
  Administered 2018-07-17: 400 mg via INTRAVENOUS
  Filled 2018-07-17: qty 200

## 2018-07-17 MED ORDER — SODIUM CHLORIDE 0.9 % IV BOLUS: 1000.0000 mL | Freq: Once | INTRAVENOUS | Status: DC

## 2018-07-17 MED ORDER — LACTATED RINGERS IV BOLUS (SEPSIS)
1000.0000 mL | Freq: Once | INTRAVENOUS | Status: AC
  Administered 2018-07-17: 1000 mL via INTRAVENOUS

## 2018-07-17 MED ORDER — OXYCODONE HCL 5 MG PO TABS
10.0000 mg | ORAL_TABLET | Freq: Once | ORAL | Status: AC
  Administered 2018-07-17: 10 mg via ORAL
  Filled 2018-07-17: qty 2

## 2018-07-17 MED ORDER — ACETAMINOPHEN 650 MG RE SUPP: 650.0000 mg | Freq: Four times a day (QID) | RECTAL | Status: DC | PRN

## 2018-07-17 MED ORDER — SODIUM CHLORIDE 0.9 % IV SOLN: 1.0000 g | INTRAVENOUS | Status: DC

## 2018-07-17 MED ORDER — SODIUM CHLORIDE 0.9 % IV BOLUS
1000.0000 mL | Freq: Once | INTRAVENOUS | Status: AC
  Administered 2018-07-17: 1000 mL via INTRAVENOUS

## 2018-07-17 MED ORDER — LEVOFLOXACIN IN D5W 750 MG/150ML IV SOLN: 750.0000 mg | Freq: Once | INTRAVENOUS | Status: DC

## 2018-07-17 MED ORDER — ONDANSETRON HCL 4 MG PO TABS: 4.0000 mg | ORAL_TABLET | Freq: Four times a day (QID) | ORAL | Status: DC | PRN

## 2018-07-17 MED ORDER — ONDANSETRON HCL 4 MG/2ML IJ SOLN: 4.0000 mg | Freq: Four times a day (QID) | INTRAMUSCULAR | Status: DC | PRN

## 2018-07-17 MED ORDER — ACETAMINOPHEN 325 MG PO TABS: 650.0000 mg | ORAL_TABLET | Freq: Four times a day (QID) | ORAL | Status: DC | PRN

## 2018-07-17 MED ORDER — SODIUM CHLORIDE 0.9 % IV SOLN
INTRAVENOUS | Status: DC
  Administered 2018-07-17 – 2018-07-21 (×7): via INTRAVENOUS

## 2018-07-17 NOTE — H&P (Signed)
History and Physical    Alyssa Haley FGH:[id number] DOB: August 21, 1950 DOA: 07/17/2018  PCP: [name], MD   Patient coming from: Home.  I have personally briefly reviewed patient's old medical records in [facility]  Chief Complaint: AMS.  HPI: Alyssa Haley is a 68 y.o. female with medical history significant of CAD, type 2 diabetes, GERD, gout, hyperlipidemia, hypertension, sepsis, history of multiple episodes of UTI who is coming to the emergency department due to progressively worse mentation over the last 3 days, in which the patient has been mostly staying in bed.  EMS reported that the patient had multiple soaked urinary pads on the bed.  She is currently unable to provide further history.  I am familiar with the case since I admit her, also for UTI 9 days ago.  ED Course: Initial vital signs temperature 99.3 F, pulse 88, respiration 19, blood pressure 169/97 mmHg O2 sat 100% on room air.  The patient received ciprofloxacin 400 mg IVPB and 2000 mL of NS bolus.  Her urinalysis showed glucosuria of 50 and proteinuria 100 mg/dL.  Moderate hemoglobinuria and large leukocyte esterase.  Microscopic examination shows large leukocyte esterase, RBC of more than 50 and WBC more than 50 per hpf.  There were a few bacteria.  There were WBC clumps and mucus present.  CBC showed a White count was 8.3, hemoglobin 13.8 g/dL and platelets 678.  Lactic acid was 2.1 and follow-up 2.3 mmol/L.  Total CK was 44.  BMP shows a glucose of 203 mg/dL and total protein of 8.4 g/dL.  Other values are within normal limits.  Influenza a and B by PCR was negative.  Her chest radiograph had no active cardiopulmonary disease.  Review of Systems: Unable to obtain.   Past Medical History:  Diagnosis Date  . Coronary artery disease   . Diabetes mellitus without complication (HCC)   . GERD (gastroesophageal reflux disease)   . Gout   . Hyperlipidemia   . Hypertension   . Sepsis (HCC) 05/28/2018  .  UTI (urinary tract infection) 05/2018    Past Surgical History:  Procedure Laterality Date  . CARDIAC CATHETERIZATION    . CORONARY ANGIOPLASTY    . CORONARY STENT PLACEMENT    . REPLACEMENT TOTAL KNEE       reports that she has quit smoking. She has never used smokeless tobacco. She reports previous alcohol use. She reports that she does not use drugs.  Allergies  Allergen Reactions  . Penicillins Hives    Did it involve swelling of the face/tongue/throat, SOB, or low BP? No Did it involve sudden or severe rash/hives, skin peeling, or any reaction on the inside of your mouth or nose? Yes Did you need to seek medical attention at a hospital or doctor's office? No When did it last happen? If all above answers are "NO", may proceed with cephalosporin use.     Family History  Problem Relation Age of Onset  . Diabetes Mellitus II Neg Hx    Prior to Admission medications   Medication Sig Start Date End Date Taking? Authorizing Provider  allopurinol (ZYLOPRIM) 100 MG tablet Take 100 mg by mouth 2 (two) times daily.    [provider]  apixaban (ELIQUIS) 5 MG TABS tablet Take 2 tablets (10 mg total) by mouth 2 (two) times daily. Discontinue after 06/03/2018 doses. Patient not taking: Reported on 07/07/2018 06/02/18   Elease Etienne, MD  apixaban (ELIQUIS) 5 MG TABS tablet Take 1 tablet (5 mg  total) by mouth 2 (two) times daily. To be started 06/04/2018. Patient taking differently: Take 5 mg by mouth 2 (two) times daily.  06/04/18   Hongalgi, Maximino Greenland, MD  atorvastatin (LIPITOR) 80 MG tablet Take 80 mg by mouth daily. 12/22/17   [provider]  feeding supplement, GLUCERNA SHAKE, (GLUCERNA SHAKE) LIQD Take 237 mLs by mouth 3 (three) times daily between meals. Patient not taking: Reported on 07/07/2018 06/02/18   Elease Etienne, MD  gabapentin (NEURONTIN) 300 MG capsule Take 1 capsule (300 mg total) by mouth 2 (two) times daily. 06/02/18   Hongalgi, Maximino Greenland, MD   insulin aspart (NOVOLOG) 100 UNIT/ML injection Inject 0-9 Units into the skin 3 (three) times daily with meals. CBG < 70: implement hypoglycemia protocol CBG 70 - 120: 0 units CBG 121 - 150: 1 unit CBG 151 - 200: 2 units CBG 201 - 250: 3 units CBG 251 - 300: 5 units CBG 301 - 350: 7 units CBG 351 - 400: 9 units CBG > 400: call MD. Patient not taking: Reported on 07/07/2018 06/02/18   Elease Etienne, MD  Insulin Glargine, 1 Unit Dial, (TOUJEO SOLOSTAR) 300 UNIT/ML SOPN Inject 35 Units into the skin daily. Patient taking differently: Inject 30 Units into the skin daily.  06/03/18   Hongalgi, Maximino Greenland, MD  metoprolol succinate (TOPROL-XL) 50 MG 24 hr tablet Take 50 mg by mouth daily. 12/22/17   [provider]  Multiple Vitamin (MULTIVITAMIN WITH MINERALS) TABS tablet Take 1 tablet by mouth daily. 06/03/18   Hongalgi, Maximino Greenland, MD  omeprazole (PRILOSEC) 40 MG capsule Take 40 mg by mouth daily.    [provider]  oxyCODONE-acetaminophen (PERCOCET) 10-325 MG tablet Take 1 tablet by mouth every 12 (twelve) hours as needed (Moderate - Severe pain.). 06/02/18   Hongalgi, Maximino Greenland, MD  polyethylene glycol [facility]) packet Take 17 g by mouth daily. Patient not taking: Reported on 07/07/2018 06/02/18   Elease Etienne, MD  Semaglutide (OZEMPIC, 0.25 OR 0.5 MG/DOSE, [place]) Inject 0.25 mg into the skin once a week.    [provider]  zolpidem (AMBIEN) 10 MG tablet Take 10 mg by mouth at bedtime as needed for sleep.    [provider]    Physical Exam: Vitals:   07/17/18 1806 07/17/18 1807 07/17/18 1808 07/17/18 1935  BP: (!) 169/97     Pulse: 88     Resp: 19     Temp: 99.3 F (37.4 C)     TempSrc: Oral     SpO2: 100%  98% 99%  Weight:  95.3 kg    Height:   (1.753 m)      Constitutional: Frail.  Looks chronically ill. Eyes: PERRL, lids and conjunctivae normal ENMT: Mucous membranes are mildly dry. Posterior pharynx clear of any exudate or lesions. Neck:  normal, supple, no masses, no thyromegaly Respiratory: Decreased breath sounds on bases, otherwise clear to auscultation bilaterally, no wheezing, no crackles. Normal respiratory effort. No accessory muscle use.  Cardiovascular: Regular rate and rhythm, no murmurs / rubs / gallops. No extremity edema. 2+ pedal pulses. No carotid bruits.  Abdomen: Soft, positive suprapubic tenderness, no guarding, no masses palpated. No hepatosplenomegaly. Bowel sounds positive.  Musculoskeletal: no clubbing / cyanosis.  Good ROM, no contractures. Normal muscle tone.  Skin: Hyperkeratosis of feet plantar and distal dorsal aspect with onychomycosis of toenails. Neurologic: CN 2-12 grossly intact.  Generalized weakness. Psychiatric: Alert, oriented x1, partially oriented to place, disoriented to  time and situation.  Labs on Admission: I have personally reviewed following labs and imaging studies  CBC: Recent Labs  Lab 07/14/18 0204 07/17/18 1854  WBC 8.5 8.3  NEUTROABS  --  4.3  HGB 13.6 13.8  HCT 42.0 46.8*  MCV 82.7 88.3  PLT 243 246   Basic Metabolic Panel: Recent Labs  Lab 07/14/18 0204 07/17/18 1854  NA 136 139  K 4.0 4.2  CL 105 104  CO2 24 27  GLUCOSE 167* 203*  BUN 14 20  CREATININE 0.84 0.91  CALCIUM 9.2 9.5   GFR: Estimated Creatinine Clearance: 73.7 mL/min (by C-G formula based on SCr of 0.91 mg/dL). Liver Function Tests: Recent Labs  Lab 07/17/18 1854  AST 26  ALT 28  ALKPHOS 119  BILITOT 1.1  PROT 8.4*  ALBUMIN 3.7   No results for input(s): LIPASE, AMYLASE in the last 168 hours. No results for input(s): AMMONIA in the last 168 hours. Coagulation Profile: No results for input(s): INR, PROTIME in the last 168 hours. Cardiac Enzymes: Recent Labs  Lab 07/17/18 1854  CKTOTAL 44   BNP (last 3 results) No results for input(s): PROBNP in the last 8760 hours. HbA1C: No results for input(s): HGBA1C in the last 72 hours. CBG: Recent Labs  Lab 07/14/18 2251  07/15/18 0820 07/15/18 1011 07/15/18 1157 07/15/18 1700  GLUCAP 84 76 150* 209* 143*   Lipid Profile: No results for input(s): CHOL, HDL, LDLCALC, TRIG, CHOLHDL, LDLDIRECT in the last 72 hours. Thyroid Function Tests: No results for input(s): TSH, T4TOTAL, FREET4, T3FREE, THYROIDAB in the last 72 hours. Anemia Panel: No results for input(s): VITAMINB12, FOLATE, FERRITIN, TIBC, IRON, RETICCTPCT in the last 72 hours. Urine analysis:    Component Value Date/Time   COLORURINE YELLOW 07/17/2018 1808   APPEARANCEUR TURBID (A) 07/17/2018 1808   LABSPEC 1.015 07/17/2018 1808   PHURINE 5.0 07/17/2018 1808   GLUCOSEU 50 (A) 07/17/2018 1808   HGBUR MODERATE (A) 07/17/2018 1808   BILIRUBINUR NEGATIVE 07/17/2018 1808   KETONESUR NEGATIVE 07/17/2018 1808   PROTEINUR 100 (A) 07/17/2018 1808   NITRITE NEGATIVE 07/17/2018 1808   LEUKOCYTESUR LARGE (A) 07/17/2018 1808    Radiological Exams on Admission: Dg Chest 2 View  Result Date: 07/17/2018 CLINICAL DATA:  Sepsis. Pt was reportedly at home and has been laying in bed x3 days. Pt HX: non smoker, DM, HTN EXAM: CHEST - 2 VIEW COMPARISON:  05/28/2018 FINDINGS: Cardiac silhouette is normal in size. No mediastinal or hilar masses. No evidence of adenopathy. Clear lungs.  No pleural effusion or pneumothorax. Skeletal structures are intact. IMPRESSION: No active cardiopulmonary disease. Electronically Signed   By: Amie Portland M.D.   On: 07/17/2018 20:18    EKG: Independently reviewed.  Vent. rate 89 BPM PR interval * ms QRS duration 79 ms QT/QTc 355/432 ms P-R-T axes 66 51 5 Sinus rhythm  Assessment/Plan Principal Problem:   Sepsis due to urinary tract infection (HCC) Most recent culture showed pansensitive E faecalis. Admit to telemetry/inpatient. Continue IV fluids. Levaquin 750 mg IVP every 24 hours. Rocephin 1 g IVPB every 24 hours. Monitor CBC and CMP. Follow-up urine culture and sensitivity. Follow-up blood culture and  sensitivity.  Active Problems:   CAD (coronary artery disease) Not taking apixaban, atorvastatin and metoprolol.    Essential hypertension Hold beta-blocker. Monitor blood pressure.    Uncontrolled type 2 diabetes mellitus with hyperglycemia (HCC) Continue IV hydration. CBG monitoring with regular insulin sliding scale. Carbohydrate modified diet once more alert.  Gout Has not been taking her allopurinol.    GERD (gastroesophageal reflux disease) Famotidine 20 mg IVP every 12 hours. Switch to oral PPI once more alert.    Hyperlipidemia Has not been taking her statin.    Pressure injury of skin Continue local care sacral area.   DVT prophylaxis: Lovenox SQ. Code Status: Full code. Family Communication: Disposition Plan: Admit to telemetry for UTI/sepsis treatment. Consults called:  Admission status: Inpatient/telemetry.   Bobette Mo MD [facility]  07/17/2018, 10:51 PM   This document was prepared using Dragon voice recognition software and may contain some unintended transcription errors.

## 2018-07-17 NOTE — ED Provider Notes (Addendum)
[facility]-EMERGENCY DEPT Provider Note   CSN: [id number] Arrival date & time: 07/17/18  1755    History   Chief Complaint Chief Complaint  Patient presents with  . Dysuria  . Urinary Frequency  . Altered Mental Status  . Fever    HPI Alyssa Haley is a 68 y.o. female.     68 yo F with a chief complaint of weakness altered mental status and urinary incontinence.  Patient has had frequent urinary tract infections that have presented similarly.  This time she has been too weak to get up out of her chair for the past 3 days.  EMS was eventually called.  Felt very warm to them on arrival.  Patient states that she just feels weak.  Denies dysuria increased frequency or hesitancy denies flank or abdominal pain.  She denies cough or congestion.  Denies fevers or chills.  The history is provided by the patient and the EMS personnel.  Dysuria  Associated symptoms: no fever, no nausea and no vomiting   Urinary Frequency  Pertinent negatives include no chest pain, no headaches and no shortness of breath.  Altered Mental Status  Associated symptoms: no fever, no headaches, no nausea, no palpitations and no vomiting   Fever  Associated symptoms: no chest pain, no chills, no congestion, no dysuria, no headaches, no myalgias, no nausea, no rhinorrhea and no vomiting   Illness  Severity:  Moderate Onset quality:  Sudden Duration:  3 days Timing:  Constant Progression:  Worsening Chronicity:  New Associated symptoms: no chest pain, no congestion, no fever, no headaches, no myalgias, no nausea, no rhinorrhea, no shortness of breath, no vomiting and no wheezing     Past Medical History:  Diagnosis Date  . Coronary artery disease   . Diabetes mellitus without complication (HCC)   . GERD (gastroesophageal reflux disease)   . Gout   . Hyperlipidemia   . Hypertension   . Sepsis (HCC) 05/28/2018  . UTI (urinary tract infection) 05/2018    Patient Active  Problem List   Diagnosis Date Noted  . Sepsis due to urinary tract infection (HCC) 07/07/2018  . GERD (gastroesophageal reflux disease) 07/07/2018  . Hyperlipidemia 07/07/2018  . Pressure injury of skin 07/07/2018  . Sepsis (HCC) 05/28/2018  . CAD (coronary artery disease) 05/28/2018  . Essential hypertension 05/28/2018  . Uncontrolled type 2 diabetes mellitus with hyperglycemia (HCC) 05/28/2018  . Gout 05/28/2018  . Acute lower UTI 05/28/2018  . Swelling of joint of left knee 05/28/2018  . Sepsis secondary to UTI (HCC) 05/28/2018    Past Surgical History:  Procedure Laterality Date  . CARDIAC CATHETERIZATION    . CORONARY ANGIOPLASTY    . CORONARY STENT PLACEMENT    . REPLACEMENT TOTAL KNEE       OB History    Gravida      Para      Term      Preterm      AB      Living  2     SAB      TAB      Ectopic      Multiple      Live Births               Home Medications    Prior to Admission medications   Medication Sig Start Date End Date Taking? Authorizing Provider  allopurinol (ZYLOPRIM) 100 MG tablet Take 100 mg by mouth 2 (two) times daily.  [provider]  apixaban (ELIQUIS) 5 MG TABS tablet Take 2 tablets (10 mg total) by mouth 2 (two) times daily. Discontinue after 06/03/2018 doses. Patient not taking: Reported on 07/07/2018 06/02/18   Elease Etienne, MD  apixaban (ELIQUIS) 5 MG TABS tablet Take 1 tablet (5 mg total) by mouth 2 (two) times daily. To be started 06/04/2018. Patient taking differently: Take 5 mg by mouth 2 (two) times daily.  06/04/18   Hongalgi, Maximino Greenland, MD  atorvastatin (LIPITOR) 80 MG tablet Take 80 mg by mouth daily. 12/22/17   [provider]  feeding supplement, GLUCERNA SHAKE, (GLUCERNA SHAKE) LIQD Take 237 mLs by mouth 3 (three) times daily between meals. Patient not taking: Reported on 07/07/2018 06/02/18   Elease Etienne, MD  gabapentin (NEURONTIN) 300 MG capsule Take 1 capsule (300 mg total) by mouth 2  (two) times daily. 06/02/18   Hongalgi, Maximino Greenland, MD  insulin aspart (NOVOLOG) 100 UNIT/ML injection Inject 0-9 Units into the skin 3 (three) times daily with meals. CBG < 70: implement hypoglycemia protocol CBG 70 - 120: 0 units CBG 121 - 150: 1 unit CBG 151 - 200: 2 units CBG 201 - 250: 3 units CBG 251 - 300: 5 units CBG 301 - 350: 7 units CBG 351 - 400: 9 units CBG > 400: call MD. Patient not taking: Reported on 07/07/2018 06/02/18   Elease Etienne, MD  Insulin Glargine, 1 Unit Dial, (TOUJEO SOLOSTAR) 300 UNIT/ML SOPN Inject 35 Units into the skin daily. Patient taking differently: Inject 30 Units into the skin daily.  06/03/18   Hongalgi, Maximino Greenland, MD  metoprolol succinate (TOPROL-XL) 50 MG 24 hr tablet Take 50 mg by mouth daily. 12/22/17   [provider]  Multiple Vitamin (MULTIVITAMIN WITH MINERALS) TABS tablet Take 1 tablet by mouth daily. 06/03/18   Hongalgi, Maximino Greenland, MD  omeprazole (PRILOSEC) 40 MG capsule Take 40 mg by mouth daily.    [provider]  oxyCODONE-acetaminophen (PERCOCET) 10-325 MG tablet Take 1 tablet by mouth every 12 (twelve) hours as needed (Moderate - Severe pain.). 06/02/18   Hongalgi, Maximino Greenland, MD  polyethylene glycol [facility]) packet Take 17 g by mouth daily. Patient not taking: Reported on 07/07/2018 06/02/18   Elease Etienne, MD  Semaglutide (OZEMPIC, 0.25 OR 0.5 MG/DOSE, [place]) Inject 0.25 mg into the skin once a week.    [provider]  zolpidem (AMBIEN) 10 MG tablet Take 10 mg by mouth at bedtime as needed for sleep.    [provider]    Family History Family History  Problem Relation Age of Onset  . Diabetes Mellitus II Neg Hx     Social History Social History   Tobacco Use  . Smoking status: Former Games developer  . Smokeless tobacco: Never Used  Substance Use Topics  . Alcohol use: Not Currently  . Drug use: Never     Allergies   Penicillins   Review of Systems Review of Systems  Constitutional: Negative for  chills and fever.  HENT: Negative for congestion and rhinorrhea.   Eyes: Negative for redness and visual disturbance.  Respiratory: Negative for shortness of breath and wheezing.   Cardiovascular: Negative for chest pain and palpitations.  Gastrointestinal: Negative for nausea and vomiting.  Genitourinary: Positive for frequency. Negative for dysuria and urgency.  Musculoskeletal: Negative for arthralgias and myalgias.  Skin: Negative for pallor and wound.  Neurological: Negative for dizziness and headaches.     Physical Exam Updated Vital  Signs BP (!) 169/97 (BP Location: Left Arm)   Pulse 88   Temp 99.3 F (37.4 C) (Oral)   Resp 19   Ht  (1.753 m)   Wt 95.3 kg   SpO2 99%   BMI 31.01 kg/m   Physical Exam Vitals signs and nursing note reviewed.  Constitutional:      General: She is not in acute distress.    Appearance: She is well-developed. She is not diaphoretic.  HENT:     Head: Normocephalic and atraumatic.     Comments: Swollen turbinates, posterior nasal drip, no noted sinus ttp, tm normal bilaterally.   Eyes:     Pupils: Pupils are equal, round, and reactive to light.  Neck:     Musculoskeletal: Normal range of motion and neck supple.  Cardiovascular:     Rate and Rhythm: Normal rate and regular rhythm.     Heart sounds: No murmur. No friction rub. No gallop.   Pulmonary:     Effort: Pulmonary effort is normal.     Breath sounds: No wheezing or rales.  Abdominal:     General: There is no distension.     Palpations: Abdomen is soft.     Tenderness: There is no abdominal tenderness.  Genitourinary:    Comments: Stage III sacral ulcer about quarter sized Musculoskeletal:        General: No tenderness.  Skin:    General: Skin is warm and dry.  Neurological:     Mental Status: She is alert.     Comments: Mild confusion  Psychiatric:        Behavior: Behavior normal.      ED Treatments / Results  Labs (all labs ordered are listed, but only  abnormal results are displayed) Labs Reviewed  COMPREHENSIVE METABOLIC PANEL - Abnormal; Notable for the following components:      Result Value   Glucose, Bld 203 (*)    Total Protein 8.4 (*)    All other components within normal limits  CBC WITH DIFFERENTIAL/PLATELET - Abnormal; Notable for the following components:   RBC 5.30 (*)    HCT 46.8 (*)    MCHC 29.5 (*)    RDW 15.6 (*)    All other components within normal limits  URINALYSIS, ROUTINE W REFLEX MICROSCOPIC - Abnormal; Notable for the following components:   APPearance TURBID (*)    Glucose, UA 50 (*)    Hgb urine dipstick MODERATE (*)    Protein, ur 100 (*)    Leukocytes,Ua LARGE (*)    RBC / HPF >50 (*)    WBC, UA >50 (*)    Bacteria, UA FEW (*)    All other components within normal limits  LACTIC ACID, PLASMA - Abnormal; Notable for the following components:   Lactic Acid, Venous 2.1 (*)    All other components within normal limits  LACTIC ACID, PLASMA - Abnormal; Notable for the following components:   Lactic Acid, Venous 2.3 (*)    All other components within normal limits  CULTURE, BLOOD (ROUTINE X 2)  CULTURE, BLOOD (ROUTINE X 2)  URINE CULTURE  CK  INFLUENZA PANEL BY PCR (TYPE A & B)  I-STAT TROPONIN, ED    EKG EKG Interpretation  Date/Time:  Thursday July 17 2018 18:20:26 EDT Ventricular Rate:  89 PR Interval:    QRS Duration: 79 QT Interval:  355 QTC Calculation: 432 R Axis:   51 Text Interpretation:  Sinus rhythm No significant change since last tracing Confirmed  by Melene Plan [phone]) on 07/17/2018 6:35:26 PM   Radiology Dg Chest 2 View  Result Date: 07/17/2018 CLINICAL DATA:  Sepsis. Pt was reportedly at home and has been laying in bed x3 days. Pt HX: non smoker, DM, HTN EXAM: CHEST - 2 VIEW COMPARISON:  05/28/2018 FINDINGS: Cardiac silhouette is normal in size. No mediastinal or hilar masses. No evidence of adenopathy. Clear lungs.  No pleural effusion or pneumothorax. Skeletal structures are  intact. IMPRESSION: No active cardiopulmonary disease. Electronically Signed   By: Amie Portland M.D.   On: 07/17/2018 20:18    Procedures Procedures (including critical care time)  Medications Ordered in ED Medications  oxyCODONE (Oxy IR/ROXICODONE) immediate release tablet 10 mg (has no administration in time range)  ciprofloxacin (CIPRO) IVPB 400 mg (has no administration in time range)  sodium chloride 0.9 % bolus 1,000 mL (has no administration in time range)  0.9 %  sodium chloride infusion (has no administration in time range)  sodium chloride 0.9 % bolus 1,000 mL (1,000 mLs Intravenous New Bag/Given 07/17/18 1947)     Initial Impression / Assessment and Plan / ED Course  I have reviewed the triage vital signs and the nursing notes.  Pertinent labs & imaging results that were available during my care of the patient were reviewed by me and considered in my medical decision making (see chart for details).        68 yo F with a chief complaint of altered mental status.  Going on for the past 3 days along with fatigue, has been unable to get out of her chair. Hx of frequent UTI's with similar presentation, will give fluids, check labs, ua, cxr, flu.   Lactate is mildly worse than before.  Will give another bolus of fluids.  Patient clinically looks like she feels better though she still too weak to get out of bed.  Her urine looks purulent grossly.   Too numerous to count whites will treat as UTI. Send off for culture.   Last culture result was reviewed and had Enterococcus faecalis that was sensitive to fluoroquinolones will start on Cipro.CXR viewed by me without focal infiltrate.     Lab work with mild dehydration  CRITICAL CARE Performed by: Rae Roam   Total critical care time: 35 minutes  Critical care time was exclusive of separately billable procedures and treating other patients.  Critical care was necessary to treat or prevent imminent or life-threatening  deterioration.  Critical care was time spent personally by me on the following activities: development of treatment plan with patient and/or surrogate as well as nursing, discussions with consultants, evaluation of patient's response to treatment, examination of patient, obtaining history from patient or surrogate, ordering and performing treatments and interventions, ordering and review of laboratory studies, ordering and review of radiographic studies, pulse oximetry and re-evaluation of patient's condition.  The patients results and plan were reviewed and discussed.   Any x-rays performed were independently reviewed by myself.   Differential diagnosis were considered with the presenting HPI.  Medications  oxyCODONE (Oxy IR/ROXICODONE) immediate release tablet 10 mg (has no administration in time range)  ciprofloxacin (CIPRO) IVPB 400 mg (has no administration in time range)  sodium chloride 0.9 % bolus 1,000 mL (has no administration in time range)  0.9 %  sodium chloride infusion (has no administration in time range)  sodium chloride 0.9 % bolus 1,000 mL (1,000 mLs Intravenous New Bag/Given 07/17/18 1947)    Vitals:   07/17/18 1806  07/17/18 1807 07/17/18 1808 07/17/18 1935  BP: (!) 169/97     Pulse: 88     Resp: 19     Temp: 99.3 F (37.4 C)     TempSrc: Oral     SpO2: 100%  98% 99%  Weight:  95.3 kg    Height:  5\' 9"  (1.753 m)      Final diagnoses:  Sepsis due to urinary tract infection (HCC)    Admission/ observation were discussed with the admitting physician, patient and/or family and they are comfortable with the plan.   Final Clinical Impressions(s) / ED Diagnoses   Final diagnoses:  Sepsis due to urinary tract infection [facility](HCC)    ED Discharge Orders    None       Melene PlanFloyd, Carrianne Hyun, DO 07/17/18 2239    Melene PlanFloyd, [place] Stell, DO 07/17/18 2241

## 2018-07-17 NOTE — ED Notes (Signed)
Bed: WA15 Expected date:  Expected time:  Means of arrival:  Comments: EMS-UTI 

## 2018-07-17 NOTE — ED Notes (Signed)
Patient was placed on Purewick catheter, pericare was performed prior to placement. Patient is aware a urine sample is needed.

## 2018-07-17 NOTE — ED Notes (Signed)
BLOOD CULTURE OBTAINED X 1 AT LEFT FOREARM

## 2018-07-17 NOTE — ED Notes (Signed)
Second set blood cultures obtained right forearm, lactic acid sent pt c/o pain to buttock and requesting something to help her relax will send message to provider.

## 2018-07-17 NOTE — ED Triage Notes (Signed)
Per EMS: Pt was reportedly at home and has been laying in bed x3 days. Pt reportedly had multiple soaked urinary pads on the bed per EMS. Pt somewhat disoriented. EDP Adela Lank at bedside

## 2018-07-18 ENCOUNTER — Encounter (HOSPITAL_COMMUNITY): Payer: Self-pay | Admitting: Internal Medicine

## 2018-07-18 DIAGNOSIS — K219 Gastro-esophageal reflux disease without esophagitis: Secondary | ICD-10-CM

## 2018-07-18 DIAGNOSIS — E1165 Type 2 diabetes mellitus with hyperglycemia: Secondary | ICD-10-CM

## 2018-07-18 DIAGNOSIS — I10 Essential (primary) hypertension: Secondary | ICD-10-CM

## 2018-07-18 DIAGNOSIS — L89152 Pressure ulcer of sacral region, stage 2: Secondary | ICD-10-CM

## 2018-07-18 LAB — COMPREHENSIVE METABOLIC PANEL
ALT: 22 U/L (ref 0–44)
AST: 17 U/L (ref 15–41)
Albumin: 2.9 g/dL — ABNORMAL LOW (ref 3.5–5.0)
Alkaline Phosphatase: 96 U/L (ref 38–126)
Anion gap: 9 (ref 5–15)
BUN: 15 mg/dL (ref 8–23)
CO2: 25 mmol/L (ref 22–32)
CREATININE: 0.73 mg/dL (ref 0.44–1.00)
Calcium: 8.9 mg/dL (ref 8.9–10.3)
Chloride: 105 mmol/L (ref 98–111)
GFR calc Af Amer: >60 mL/min (ref >60)
GFR calc non Af Amer: >60 mL/min (ref >60)
Glucose, Bld: 174 mg/dL — ABNORMAL HIGH (ref 70–99)
Potassium: 3.6 mmol/L (ref 3.5–5.1)
Sodium: 139 mmol/L (ref 135–145)
Total Bilirubin: 0.9 mg/dL (ref 0.3–1.2)
Total Protein: 6.7 g/dL (ref 6.5–8.1)

## 2018-07-18 LAB — CBC WITH DIFFERENTIAL/PLATELET
ABS IMMATURE GRANULOCYTES: 0.04 10*3/uL (ref 0.00–0.07)
Basophils Absolute: 0.1 10*3/uL (ref 0.0–0.1)
Basophils Relative: 1 %
Eosinophils Absolute: 0.1 10*3/uL (ref 0.0–0.5)
Eosinophils Relative: 1 %
HCT: 40.5 % (ref 36.0–46.0)
Hemoglobin: 12.2 g/dL (ref 12.0–15.0)
Immature Granulocytes: 1 %
Lymphocytes Relative: 35 %
Lymphs Abs: 2.7 10*3/uL (ref 0.7–4.0)
MCH: 26.5 pg (ref 26.0–34.0)
MCHC: 30.1 g/dL (ref 30.0–36.0)
MCV: 87.9 fL (ref 80.0–100.0)
MONOS PCT: 8 %
Monocytes Absolute: 0.6 10*3/uL (ref 0.1–1.0)
Neutro Abs: 4.4 10*3/uL (ref 1.7–7.7)
Neutrophils Relative %: 54 %
Platelets: 229 10*3/uL (ref 150–400)
RBC: 4.61 MIL/uL (ref 3.87–5.11)
RDW: 15.4 % (ref 11.5–15.5)
WBC: 7.9 10*3/uL (ref 4.0–10.5)
nRBC: 0 % (ref 0.0–0.2)

## 2018-07-18 LAB — GLUCOSE, CAPILLARY
Glucose-Capillary: 212 mg/dL — ABNORMAL HIGH (ref 70–99)
Glucose-Capillary: 204 mg/dL — ABNORMAL HIGH (ref 70–99)

## 2018-07-18 LAB — LACTIC ACID, PLASMA: Lactic Acid, Venous: 1.2 mmol/L (ref 0.5–1.9)

## 2018-07-18 LAB — MAGNESIUM: Magnesium: 1.8 mg/dL (ref 1.7–2.4)

## 2018-07-18 LAB — CBG MONITORING, ED: Glucose-Capillary: 148 mg/dL — ABNORMAL HIGH (ref 70–99)

## 2018-07-18 MED ORDER — METOPROLOL SUCCINATE ER 50 MG PO TB24
50.0000 mg | ORAL_TABLET | Freq: Every day | ORAL | Status: DC
  Administered 2018-07-18 – 2018-07-22 (×5): 50 mg via ORAL
  Filled 2018-07-18 (×5): qty 1

## 2018-07-18 MED ORDER — ALLOPURINOL 100 MG PO TABS
100.0000 mg | ORAL_TABLET | Freq: Two times a day (BID) | ORAL | Status: DC
  Administered 2018-07-18 – 2018-07-22 (×8): 100 mg via ORAL
  Filled 2018-07-18 (×9): qty 1

## 2018-07-18 MED ORDER — INSULIN GLARGINE 100 UNIT/ML ~~LOC~~ SOLN
30.0000 [IU] | Freq: Every day | SUBCUTANEOUS | Status: DC
  Administered 2018-07-19 – 2018-07-20 (×3): 30 [IU] via SUBCUTANEOUS
  Filled 2018-07-18 (×3): qty 0.3

## 2018-07-18 MED ORDER — OXYCODONE HCL 5 MG PO TABS
5.0000 mg | ORAL_TABLET | Freq: Four times a day (QID) | ORAL | Status: DC | PRN
  Administered 2018-07-18: 5 mg via ORAL
  Filled 2018-07-18: qty 1

## 2018-07-18 MED ORDER — PANTOPRAZOLE SODIUM 40 MG PO TBEC
40.0000 mg | DELAYED_RELEASE_TABLET | Freq: Every day | ORAL | Status: DC
  Administered 2018-07-18 – 2018-07-22 (×5): 40 mg via ORAL
  Filled 2018-07-18 (×5): qty 1

## 2018-07-18 MED ORDER — ATORVASTATIN CALCIUM 40 MG PO TABS
80.0000 mg | ORAL_TABLET | Freq: Every day | ORAL | Status: DC
  Administered 2018-07-19 – 2018-07-22 (×4): 80 mg via ORAL
  Filled 2018-07-18 (×6): qty 2

## 2018-07-18 MED ORDER — SODIUM CHLORIDE 0.9 % IV SOLN
1.0000 g | INTRAVENOUS | Status: DC
  Administered 2018-07-18 – 2018-07-22 (×5): 1 g via INTRAVENOUS
  Filled 2018-07-18: qty 10
  Filled 2018-07-18 (×2): qty 1
  Filled 2018-07-18: qty 10
  Filled 2018-07-18: qty 1

## 2018-07-18 MED ORDER — NYSTATIN 100000 UNIT/ML MT SUSP
5.0000 mL | Freq: Four times a day (QID) | OROMUCOSAL | Status: DC
  Administered 2018-07-18 – 2018-07-22 (×12): 500000 [IU] via ORAL
  Filled 2018-07-18 (×15): qty 5

## 2018-07-18 MED ORDER — OXYCODONE-ACETAMINOPHEN 10-325 MG PO TABS: 1.0000 | ORAL_TABLET | Freq: Four times a day (QID) | ORAL | Status: DC | PRN

## 2018-07-18 MED ORDER — INSULIN GLARGINE (1 UNIT DIAL) 300 UNIT/ML ~~LOC~~ SOPN: 30.0000 [IU] | PEN_INJECTOR | Freq: Every day | SUBCUTANEOUS | Status: DC

## 2018-07-18 MED ORDER — GABAPENTIN 300 MG PO CAPS
300.0000 mg | ORAL_CAPSULE | Freq: Two times a day (BID) | ORAL | Status: DC
  Administered 2018-07-18 – 2018-07-22 (×7): 300 mg via ORAL
  Filled 2018-07-18 (×9): qty 1

## 2018-07-18 MED ORDER — GLUCERNA SHAKE PO LIQD
237.0000 mL | Freq: Three times a day (TID) | ORAL | Status: DC
  Administered 2018-07-18 – 2018-07-22 (×10): 237 mL via ORAL
  Filled 2018-07-18 (×13): qty 237

## 2018-07-18 MED ORDER — POLYETHYLENE GLYCOL 3350 17 G PO PACK
17.0000 g | PACK | Freq: Every day | ORAL | Status: DC
  Administered 2018-07-18 – 2018-07-22 (×3): 17 g via ORAL
  Filled 2018-07-18 (×4): qty 1

## 2018-07-18 MED ORDER — APIXABAN 5 MG PO TABS
5.0000 mg | ORAL_TABLET | Freq: Two times a day (BID) | ORAL | Status: DC
  Administered 2018-07-18 – 2018-07-22 (×8): 5 mg via ORAL
  Filled 2018-07-18 (×9): qty 1

## 2018-07-18 MED ORDER — OXYCODONE-ACETAMINOPHEN 5-325 MG PO TABS
1.0000 | ORAL_TABLET | Freq: Four times a day (QID) | ORAL | Status: DC | PRN
  Administered 2018-07-18 – 2018-07-20 (×2): 1 via ORAL
  Filled 2018-07-18 (×3): qty 1

## 2018-07-18 MED ORDER — INSULIN ASPART 100 UNIT/ML ~~LOC~~ SOLN
0.0000 [IU] | Freq: Three times a day (TID) | SUBCUTANEOUS | Status: DC
  Administered 2018-07-18: 3 [IU] via SUBCUTANEOUS
  Administered 2018-07-18 – 2018-07-19 (×3): 1 [IU] via SUBCUTANEOUS
  Administered 2018-07-20 (×2): 2 [IU] via SUBCUTANEOUS
  Administered 2018-07-21: 1 [IU] via SUBCUTANEOUS
  Administered 2018-07-21 (×2): 3 [IU] via SUBCUTANEOUS
  Administered 2018-07-22: 1 [IU] via SUBCUTANEOUS
  Administered 2018-07-22: 2 [IU] via SUBCUTANEOUS
  Filled 2018-07-18 (×2): qty 1

## 2018-07-18 NOTE — ED Notes (Signed)
Bed: WA27 Expected date:  Expected time:  Means of arrival:  Comments: Hold for 15 

## 2018-07-18 NOTE — Progress Notes (Signed)
PROGRESS NOTE  Alyssa Haley EOF:[id number] DOB: [birth year] DOA: 07/17/2018 PCP: [name], MD   LOS: 1 day   Brief narrative: Alyssa Haley is a 68 y.o. female with medical history significant of CAD, type 2 diabetes, GERD, gout, hyperlipidemia, hypertension, sepsis, history of multiple episodes of UTI presented to the emergency department due to progressively worse mentation over the last 3 days, in which the patient has been mostly staying in bed.  EMS reported that the patient had multiple soaked urinary pads on the bed.  She wss currently unable to provide further history.  In the ED, Initial vital signs temperature was 99.3 F, pulse 88, respiration 19, blood pressure 169/97 mmHg O2 sat 100% on room air.  The patient received ciprofloxacin 400 mg IVPB and 2000 mL of NS bolus and was admitted to the hospital.  Subjective: Patient is a poor historian.  She complaints of the pain on the back.  Patient was able to tell where she was.  Assessment/Plan:  Principal Problem:   Sepsis due to urinary tract infection (HCC) Active Problems:   CAD (coronary artery disease)   Essential hypertension   Uncontrolled type 2 diabetes mellitus with hyperglycemia (HCC)   Gout   GERD (gastroesophageal reflux disease)   Hyperlipidemia   Pressure injury of skin  Sepsis due to urinary tract infection (HCC) Most recent culture showed pansensitive E faecalis. Continue on IV antibiotics IV fluids.  Follow blood cultures and urine culture.      CAD (coronary artery disease) Not taking apixaban, atorvastatin and metoprolol.  Will resume home medication.    Essential hypertension We will resume beta-blocker for now.  Will monitor blood pressure.    Uncontrolled type 2 diabetes mellitus with hyperglycemia (HCC) Continue IV hydration.  Start diabetic diet.  Continue sliding scale insulin, Accu-Cheks patient takes glargine 30 units daily.  Will resume long-acting insulin.    Gout Has not  been taking her allopurinol.  Will resume    GERD (gastroesophageal reflux disease) Change to PPI.    Hyperlipidemia Resume statin    Pressure injury of skin sacral ulcer stage II, present on admission Continue local care, sacral area.   VTE Prophylaxis: Apixaban  Code Status: Full code  Family Communication: No one was available at bedside  Disposition Plan: Home with home health/skilled nursing facility   Consultants:  None  Procedures:  None  Antibiotics: Anti-infectives (From admission, onward)   Start     Dose/Rate Route Frequency Ordered Stop   07/18/18 1000  cefTRIAXone (ROCEPHIN) 1 g in sodium chloride 0.9 % 100 mL IVPB  Status:  Discontinued     1 g 200 mL/hr over 30 Minutes Intravenous Every 24 hours 07/17/18 2307 07/18/18 0246   07/18/18 1000  cefTRIAXone (ROCEPHIN) 1 g in sodium chloride 0.9 % 100 mL IVPB     1 g 200 mL/hr over 30 Minutes Intravenous Every 24 hours 07/18/18 0254     07/17/18 2245  levofloxacin (LEVAQUIN) IVPB 750 mg  Status:  Discontinued     750 mg 100 mL/hr over 90 Minutes Intravenous  Once 07/17/18 2241 07/17/18 2306   07/17/18 2200  ciprofloxacin (CIPRO) IVPB 400 mg     400 mg 200 mL/hr over 60 Minutes Intravenous  Once 07/17/18 2147 07/18/18 0050      Objective: Vitals:   07/18/18 0800 07/18/18 0900  BP: 138/72 (!) 152/72  Pulse: 85 89  Resp: (!) 8 10  Temp:    SpO2: 96% 97%    Intake/Output  Summary (Last 24 hours) at 07/18/2018 1136 Last data filed at 07/18/2018 1051 Gross per 24 hour  Intake 1600.82 ml  Output 1150 ml  Net 450.82 ml   Filed Weights   07/17/18 1807  Weight: 95.3 kg   Body mass index is 31.01 kg/m.   Physical Exam: GENERAL: Patient is alert awake and oriented to place and person.. Not in obvious distress.  Anxious and frail looking.  Chronically ill. HENT: No scleral pallor or icterus. Pupils equally reactive to light. Oral mucosa is mildly dry. NECK: is supple, no palpable thyroid  enlargement. CHEST: Clear to auscultation. No crackles or wheezes. Non tender on palpation. Diminished breath sounds bilaterally. CVS: S1 and S2 heard, no murmur. Regular rate and rhythm. No pericardial rub. ABDOMEN: Soft, non-tender, bowel sounds are present.  Suprapubic tenderness.  Foley catheter in place. No palpable hepato-splenomegaly. EXTREMITIES: No edema. CNS: Cranial nerves are intact. No focal motor or sensory deficits. SKIN: warm and dry, sacral decubitus ulceration stage II present on admission.  Data Review: I have personally reviewed the following laboratory data and studies,  CBC: Recent Labs  Lab 07/14/18 0204 07/17/18 1854 07/18/18 0500  WBC 8.5 8.3 7.9  NEUTROABS  --  4.3 4.4  HGB 13.6 13.8 12.2  HCT 42.0 46.8* 40.5  MCV 82.7 88.3 87.9  PLT 243 246 229   Basic Metabolic Panel: Recent Labs  Lab 07/14/18 0204 07/17/18 1854 07/18/18 0500  NA 136 139 139  K 4.0 4.2 3.6  CL 105 104 105  CO2 24 27 25   GLUCOSE 167* 203* 174*  BUN 14 20 15   CREATININE 0.84 0.91 0.73  CALCIUM 9.2 9.5 8.9  MG  --  1.8  --    Liver Function Tests: Recent Labs  Lab 07/17/18 1854 07/18/18 0500  AST 26 17  ALT 28 22  ALKPHOS 119 96  BILITOT 1.1 0.9  PROT 8.4* 6.7  ALBUMIN 3.7 2.9*   No results for input(s): LIPASE, AMYLASE in the last 168 hours. No results for input(s): AMMONIA in the last 168 hours. Cardiac Enzymes: Recent Labs  Lab 07/17/18 1854  CKTOTAL 44   BNP (last 3 results) No results for input(s): BNP in the last 8760 hours.  ProBNP (last 3 results) No results for input(s): PROBNP in the last 8760 hours.  CBG: Recent Labs  Lab 07/15/18 0820 07/15/18 1011 07/15/18 1157 07/15/18 1700 07/18/18 0746  GLUCAP 76 150* 209* 143* 148*   No results found for this or any previous visit (from the past 240 hour(s)).   Studies: Dg Chest 2 View  Result Date: 07/17/2018 CLINICAL DATA:  Sepsis. Pt was reportedly at home and has been laying in bed x3 days.  Pt HX: non smoker, DM, HTN EXAM: CHEST - 2 VIEW COMPARISON:  05/28/2018 FINDINGS: Cardiac silhouette is normal in size. No mediastinal or hilar masses. No evidence of adenopathy. Clear lungs.  No pleural effusion or pneumothorax. Skeletal structures are intact. IMPRESSION: No active cardiopulmonary disease. Electronically Signed   By: Amie Portland M.D.   On: 07/17/2018 20:18    Scheduled Meds: . insulin aspart  0-9 Units Subcutaneous TID WC    Continuous Infusions: . sodium chloride Stopped (07/18/18 0914)  . cefTRIAXone (ROCEPHIN)  IV Stopped (07/18/18 1025)  . sodium chloride Stopped (07/18/18 0024)     Joycelyn Das, MD  [facility] 07/18/2018

## 2018-07-18 NOTE — ED Notes (Signed)
Pt. Propped up to eat their food. Needed help from another NT to help her sit up. Pt. Is eating her food fine.

## 2018-07-18 NOTE — Progress Notes (Signed)
MD notified about Foley placement. No indication given for purpose. Requesting clarification from MD if Foley is necessary. No response given. Will continue Foley until clarified.

## 2018-07-18 NOTE — Progress Notes (Signed)
Pharmacy Antibiotic Note  Alyssa Haley is a 68 y.o. female admitted on 07/17/2018 with UTI.  Pharmacy has been consulted for Levofloxacin dosing.  Plan: Levofloxacin 500mg  iv q24hr  Height: 5\' 9"  (175.3 cm) Weight: 210 lb (95.3 kg) IBW/kg (Calculated) : 66.2  Temp (24hrs), Avg:99.3 F (37.4 C), Min:99.3 F (37.4 C), Max:99.3 F (37.4 C)  Recent Labs  Lab 07/14/18 0204 07/17/18 1844 07/17/18 1854 07/17/18 1935  WBC 8.5  --  8.3  --   CREATININE 0.84  --  0.91  --   LATICACIDVEN  --  2.1*  --  2.3*    Estimated Creatinine Clearance: 73.7 mL/min (by C-G formula based on SCr of 0.91 mg/dL).    Allergies  Allergen Reactions  . Penicillins Hives    Did it involve swelling of the face/tongue/throat, SOB, or low BP? No Did it involve sudden or severe rash/hives, skin peeling, or any reaction on the inside of your mouth or nose? Yes Did you need to seek medical attention at a hospital or doctor's office? No When did it last happen? If all above answers are "NO", may proceed with cephalosporin use.     Antimicrobials this admission: Levofloxacin 07/18/2018 >> Ceftriaxone 07/18/2018 >>  Dose adjustments this admission: -  Microbiology results: -  Thank you for allowing pharmacy to be a part of this patient's care.  Aleene Davidson Crowford 07/18/2018 3:08 AM

## 2018-07-19 LAB — CBC WITH DIFFERENTIAL/PLATELET
Abs Immature Granulocytes: 0.01 10*3/uL (ref 0.00–0.07)
Basophils Absolute: 0.1 10*3/uL (ref 0.0–0.1)
Basophils Relative: 1 %
Eosinophils Absolute: 0.1 10*3/uL (ref 0.0–0.5)
Eosinophils Relative: 1 %
HCT: 34.6 % — ABNORMAL LOW (ref 36.0–46.0)
HEMOGLOBIN: 10.8 g/dL — AB (ref 12.0–15.0)
IMMATURE GRANULOCYTES: 0 %
Lymphocytes Relative: 40 %
Lymphs Abs: 2.7 10*3/uL (ref 0.7–4.0)
MCH: 26.5 pg (ref 26.0–34.0)
MCHC: 31.2 g/dL (ref 30.0–36.0)
MCV: 84.8 fL (ref 80.0–100.0)
Monocytes Absolute: 0.5 10*3/uL (ref 0.1–1.0)
Monocytes Relative: 7 %
Neutro Abs: 3.4 10*3/uL (ref 1.7–7.7)
Neutrophils Relative %: 51 %
Platelets: 112 10*3/uL — ABNORMAL LOW (ref 150–400)
RBC: 4.08 MIL/uL (ref 3.87–5.11)
RDW: 15.2 % (ref 11.5–15.5)
WBC: 6.8 10*3/uL (ref 4.0–10.5)
nRBC: 0 % (ref 0.0–0.2)

## 2018-07-19 LAB — GLUCOSE, CAPILLARY
GLUCOSE-CAPILLARY: 116 mg/dL — AB (ref 70–99)
Glucose-Capillary: 138 mg/dL — ABNORMAL HIGH (ref 70–99)
Glucose-Capillary: 140 mg/dL — ABNORMAL HIGH (ref 70–99)
Glucose-Capillary: 69 mg/dL — ABNORMAL LOW (ref 70–99)
Glucose-Capillary: 76 mg/dL (ref 70–99)

## 2018-07-19 NOTE — Progress Notes (Addendum)
PROGRESS NOTE  Alyssa Haley ZOX:[id number] DOB: January 12, 1951 DOA: 07/17/2018 PCP: [name], MD   LOS: 2 days   Brief narrative: Alyssa Haley is a 68 y.o. female with medical history significant of CAD, type 2 diabetes, GERD, gout, hyperlipidemia, hypertension, sepsis, history of multiple episodes of UTI presented to the emergency department due to progressively worse mentation over the last 3 days, in which the patient has been mostly staying in bed.  EMS reported that the patient had multiple soaked urinary pads on the bed.  She wss currently unable to provide further history.  In the ED, Initial vital signs temperature was 99.3 F, pulse 88, respiration 19, blood pressure 169/97 mmHg O2 sat 100% on room air.  The patient received ciprofloxacin 400 mg IVPB and 2000 mL of NS bolus and was admitted to the hospital.  Subjective: Patient denies any back pain today.  Patient denies any fever, chills nausea or vomiting.  Poor historian.  Assessment/Plan:  Principal Problem:   Sepsis due to urinary tract infection (HCC) Active Problems:   CAD (coronary artery disease)   Essential hypertension   Uncontrolled type 2 diabetes mellitus with hyperglycemia (HCC)   Gout   GERD (gastroesophageal reflux disease)   Hyperlipidemia   Pressure injury of skin  Sepsis due to urinary tract infection (HCC) Most recent culture showed pansensitive E faecalis. Continue on IV antibiotics  andIV fluids.  on Rocephin.    Follow blood cultures and urine culture.  Blood cultures negative at 24 hours.  No leukocytosis or fever today.  Check lactate today    CAD (coronary artery disease) Not taking apixaban, atorvastatin and metoprolol.  Will resume home medication.  Patient was counseled regarding compliance.    Essential hypertension We will resume beta-blocker for now.  Will monitor blood pressure.    Uncontrolled type 2 diabetes mellitus with hyperglycemia (HCC) Continue IV hydration.     Continue sliding scale insulin, Lantus diabetic diet and Accu-Cheks.  Overall glycemic control is adequate.    Gout Has not been taking her allopurinol.  Has been resumed.    GERD (gastroesophageal reflux disease) Continue PPI.    Hyperlipidemia on statin    Pressure injury of skin sacral ulcer stage II, present on admission Continue local care, sacral area.   VTE Prophylaxis: Apixaban  Code Status: Full code  Family Communication: I tried to reach the patient's contact listed on the computer but was unable to reach.  Disposition Plan: Home with home health/skilled nursing facility likely in 1 to 2 days.   Consultants:  None  Procedures:  None  Antibiotics: Anti-infectives (From admission, onward)   Start     Dose/Rate Route Frequency Ordered Stop   07/18/18 1000  cefTRIAXone (ROCEPHIN) 1 g in sodium chloride 0.9 % 100 mL IVPB  Status:  Discontinued     1 g 200 mL/hr over 30 Minutes Intravenous Every 24 hours 07/17/18 2307 07/18/18 0246   07/18/18 1000  cefTRIAXone (ROCEPHIN) 1 g in sodium chloride 0.9 % 100 mL IVPB     1 g 200 mL/hr over 30 Minutes Intravenous Every 24 hours 07/18/18 0254     07/17/18 2245  levofloxacin (LEVAQUIN) IVPB 750 mg  Status:  Discontinued     750 mg 100 mL/hr over 90 Minutes Intravenous  Once 07/17/18 2241 07/17/18 2306   07/17/18 2200  ciprofloxacin (CIPRO) IVPB 400 mg     400 mg 200 mL/hr over 60 Minutes Intravenous  Once 07/17/18 2147 07/18/18 0050  Objective: Vitals:   07/19/18 0521 07/19/18 0755  BP: (!) 142/73 126/68  Pulse: 70 68  Resp: 18 20  Temp: 99 F (37.2 C) 98.5 F (36.9 C)  SpO2: 97% 99%    Intake/Output Summary (Last 24 hours) at 07/19/2018 1130 Last data filed at 07/19/2018 0900 Gross per 24 hour  Intake 2227.91 ml  Output 1950 ml  Net 277.91 ml   Filed Weights   07/17/18 1807  Weight: 95.3 kg   Body mass index is 31.01 kg/m.   Physical Exam: General:  Average built, not in obvious  distress.  Alert awake communicative.  Obese, anxious and chronically ill. HENT: Normocephalic, pupils equally reacting to light and accommodation.  No scleral pallor or icterus noted. Oral mucosa is moist.  Chest:  Clear breath sounds.  Diminished breath sounds bilaterally. No crackles or wheezes.  CVS: S1 &S2 heard. No murmur.  Regular rate and rhythm. Abdomen: Soft, nontender, nondistended.  Bowel sounds are heard.  Liver is not palpable, no abdominal mass palpated.  Foley catheter in place. Extremities: No cyanosis, clubbing or edema.  Peripheral pulses are palpable. Psych: Alert, awake and oriented, normal mood CNS:  No cranial nerve deficits.  Power equal in all extremities.  No sensory deficits noted.  No cerebellar signs.   Skin: Warm and dry.  Sacral decubitus ulceration stage II present on admission.   Data Review: I have personally reviewed the following laboratory data and studies,  CBC: Recent Labs  Lab 07/14/18 0204 07/17/18 1854 07/18/18 0500 07/19/18 0551  WBC 8.5 8.3 7.9 6.8  NEUTROABS  --  4.3 4.4 3.4  HGB 13.6 13.8 12.2 10.8*  HCT 42.0 46.8* 40.5 34.6*  MCV 82.7 88.3 87.9 84.8  PLT 243 246 229 112*   Basic Metabolic Panel: Recent Labs  Lab 07/14/18 0204 07/17/18 1854 07/18/18 0500  NA 136 139 139  K 4.0 4.2 3.6  CL 105 104 105  CO2 GLUCOSE 167* 203* 174*  BUN CREATININE 0.84 0.91 0.73  CALCIUM 9.2 9.5 8.9  MG  --  1.8  --    Liver Function Tests: Recent Labs  Lab 07/17/18 1854 07/18/18 0500  AST 26 17  ALT 28 22  ALKPHOS 119 96  BILITOT 1.1 0.9  PROT 8.4* 6.7  ALBUMIN 3.7 2.9*   No results for input(s): LIPASE, AMYLASE in the last 168 hours. No results for input(s): AMMONIA in the last 168 hours. Cardiac Enzymes: Recent Labs  Lab 07/17/18 1854  CKTOTAL 44   BNP (last 3 results) No results for input(s): BNP in the last 8760 hours.  ProBNP (last 3 results) No results for input(s): PROBNP in the last 8760 hours.   CBG: Recent Labs  Lab 07/18/18 0746 07/18/18 1112 07/18/18 1553 07/18/18 2155 07/19/18 0756  GLUCAP 148* 138* 212* 204* 140*   Recent Results (from the past 240 hour(s))  Blood Culture (routine x 2)     Status: None (Preliminary result)   Collection Time: 07/17/18  6:13 PM  Result Value Ref Range Status   Specimen Description   Final    BLOOD BLOOD RIGHT FOREARM Performed at [facility], [address] [place], [place] [address]    Special Requests   Final    BOTTLES DRAWN AEROBIC AND ANAEROBIC Blood Culture adequate volume Performed at [facility], [address], [place], [place] [address]    Culture   Final    NO GROWTH <  24 HOURS Performed at [facility], [address], [place], [place] [address]    Report Status PENDING  Incomplete  Blood Culture (routine x 2)     Status: None (Preliminary result)   Collection Time: 07/17/18  6:48 PM  Result Value Ref Range Status   Specimen Description   Final    BLOOD BLOOD LEFT FOREARM Performed at [facility], [address], [place], [place] [address]    Special Requests   Final    BOTTLES DRAWN AEROBIC AND ANAEROBIC Blood Culture adequate volume Performed at [facility], [address], [place], [place] [address]    Culture   Final    NO GROWTH < 24 HOURS Performed at [facility], [address], [place], [place] [address]    Report Status PENDING  Incomplete     Studies: Dg Chest 2 View  Result Date: 07/17/2018 CLINICAL DATA:  Sepsis. Pt was reportedly at home and has been laying in bed x3 days. Pt HX: non smoker, DM, HTN EXAM: CHEST - 2 VIEW COMPARISON:  05/28/2018 FINDINGS: Cardiac silhouette is normal in size. No mediastinal or hilar masses. No evidence of adenopathy. Clear lungs.  No pleural effusion or pneumothorax. Skeletal structures are intact. IMPRESSION: No active cardiopulmonary disease. Electronically Signed   By:  Amie Portland M.D.   On: 07/17/2018 20:18    Scheduled Meds: . allopurinol  100 mg Oral BID  . apixaban  5 mg Oral BID  . atorvastatin  80 mg Oral Daily  . feeding supplement (GLUCERNA SHAKE)  237 mL Oral TID BM  . gabapentin  300 mg Oral BID  . insulin aspart  0-9 Units Subcutaneous TID WC  . insulin glargine  30 Units Subcutaneous Daily  . metoprolol succinate  50 mg Oral Daily  . nystatin  5 mL Oral QID  . pantoprazole  40 mg Oral Daily  . polyethylene glycol  17 g Oral Daily    Continuous Infusions: . sodium chloride 100 mL/hr at 07/19/18 0524  . cefTRIAXone (ROCEPHIN)  IV 1 g (07/19/18 0935)  . sodium chloride Stopped (07/18/18 0024)     Joycelyn Das, MD  [facility] 07/19/2018

## 2018-07-19 NOTE — Progress Notes (Signed)
Notified by CMT patient had 11 beat run of v-tach. Patient asymptomatic and vital signs stable.  Dr. Tyson Babinski notified via [facility] and also verbally notified.  Will continue to monitor patient.

## 2018-07-19 NOTE — Progress Notes (Signed)
Hypoglycemic Event  CBG: 69  Treatment: 4 oz juice/soda  Symptoms: None  Follow-up CBG: Time:1725 CBG Result: 76  Possible Reasons for Event: Inadequate meal intake  Comments/MD notified:    Deneise Lever

## 2018-07-20 LAB — CBC WITH DIFFERENTIAL/PLATELET
Abs Immature Granulocytes: 0.01 10*3/uL (ref 0.00–0.07)
BASOS ABS: 0.1 10*3/uL (ref 0.0–0.1)
Basophils Relative: 1 %
EOS PCT: 2 %
Eosinophils Absolute: 0.1 10*3/uL (ref 0.0–0.5)
HCT: 37.3 % (ref 36.0–46.0)
Hemoglobin: 11.3 g/dL — ABNORMAL LOW (ref 12.0–15.0)
Immature Granulocytes: 0 %
Lymphocytes Relative: 39 %
Lymphs Abs: 2.3 10*3/uL (ref 0.7–4.0)
MCH: 26.5 pg (ref 26.0–34.0)
MCHC: 30.3 g/dL (ref 30.0–36.0)
MCV: 87.4 fL (ref 80.0–100.0)
Monocytes Absolute: 0.5 10*3/uL (ref 0.1–1.0)
Monocytes Relative: 8 %
Neutro Abs: 2.9 10*3/uL (ref 1.7–7.7)
Neutrophils Relative %: 50 %
Platelets: 188 10*3/uL (ref 150–400)
RBC: 4.27 MIL/uL (ref 3.87–5.11)
RDW: 15.3 % (ref 11.5–15.5)
WBC: 5.8 10*3/uL (ref 4.0–10.5)
nRBC: 0 % (ref 0.0–0.2)

## 2018-07-20 LAB — URINE CULTURE: Culture: >=100000 — AB

## 2018-07-20 LAB — GLUCOSE, CAPILLARY
GLUCOSE-CAPILLARY: 178 mg/dL — AB (ref 70–99)
Glucose-Capillary: 131 mg/dL — ABNORMAL HIGH (ref 70–99)
Glucose-Capillary: 86 mg/dL (ref 70–99)
Glucose-Capillary: 177 mg/dL — ABNORMAL HIGH (ref 70–99)
Glucose-Capillary: 200 mg/dL — ABNORMAL HIGH (ref 70–99)

## 2018-07-20 MED ORDER — INSULIN GLARGINE 100 UNIT/ML ~~LOC~~ SOLN
20.0000 [IU] | Freq: Every day | SUBCUTANEOUS | Status: DC
  Administered 2018-07-21 – 2018-07-22 (×2): 20 [IU] via SUBCUTANEOUS
  Filled 2018-07-20 (×2): qty 0.2

## 2018-07-20 NOTE — Progress Notes (Signed)
PROGRESS NOTE  Alyssa Haley ZOX:[id number] DOB: 11-Jul-1950 DOA: 07/17/2018 PCP: [name], MD   LOS: 3 days   Brief narrative:  Alyssa Haley is a 68 y.o. female with medical history significant of CAD, type 2 diabetes, GERD, gout, hyperlipidemia, hypertension, sepsis, history of multiple episodes of UTI presented to the emergency department due to progressively worse mentation over the last 3 days, in which the patient has been mostly staying in bed.  EMS reported that the patient had multiple soaked urinary pads on the bed.  She wss currently unable to provide further history.  In the ED, Initial vital signs temperature was 99.3 F, pulse 88, respiration 19, blood pressure 169/97 mmHg O2 sat 100% on room air.  The patient received ciprofloxacin 400 mg IVPB and 2000 mL of NS bolus and was admitted to the hospital.  Subjective: Patient states that she is weak and deconditioned.  Denies any fever chills or rigor.  Assessment/Plan:  Principal Problem:   Sepsis due to urinary tract infection (HCC) Active Problems:   CAD (coronary artery disease)   Essential hypertension   Uncontrolled type 2 diabetes mellitus with hyperglycemia (HCC)   Gout   GERD (gastroesophageal reflux disease)   Hyperlipidemia   Pressure injury of skin  Sepsis due to urinary tract infection (HCC) Most recent culture showed pansensitive E faecalis. Continue on IV antibiotics  and IV fluids.  on Rocephin.    Follow blood cultures and urine culture.  Blood cultures negative 2 days.  No leukocytosis or fever today.  Check lactate was 1.2 which improved.    CAD (coronary artery disease) Not taking apixaban, atorvastatin and metoprolol at home which has been resumed here.  Patient was counseled regarding compliance.    Essential hypertension On betablockers    Uncontrolled type 2 diabetes mellitus with hyperglycemia (HCC) Continue IV hydration.    Continue sliding scale insulin, Lantus diabetic diet  and Accu-Cheks.  Overall glycemic control is adequate.    Gout On allopurinol.    GERD (gastroesophageal reflux disease) Continue PPI.    Hyperlipidemia on statin    Pressure injury of skin sacral ulcer stage II, present on admission Continue local care, sacral area.   VTE Prophylaxis: Apixaban  Code Status: Full code  Family Communication: I again try to reach the patient's her daughter on the phone today but was unable to reach.  Disposition Plan: Home with home health/skilled nursing facility likely in 1 to 2 days.  On previous admission patient's family did not wish her to go to skilled nursing facility.  Will need physical therapy reevaluation at this time.   Consultants:  None  Procedures:  None  Antibiotics: Anti-infectives (From admission, onward)   Start     Dose/Rate Route Frequency Ordered Stop   07/18/18 1000  cefTRIAXone (ROCEPHIN) 1 g in sodium chloride 0.9 % 100 mL IVPB  Status:  Discontinued     1 g 200 mL/hr over 30 Minutes Intravenous Every 24 hours 07/17/18 2307 07/18/18 0246   07/18/18 1000  cefTRIAXone (ROCEPHIN) 1 g in sodium chloride 0.9 % 100 mL IVPB     1 g 200 mL/hr over 30 Minutes Intravenous Every 24 hours 07/18/18 0254     07/17/18 2245  levofloxacin (LEVAQUIN) IVPB 750 mg  Status:  Discontinued     750 mg 100 mL/hr over 90 Minutes Intravenous  Once 07/17/18 2241 07/17/18 2306   07/17/18 2200  ciprofloxacin (CIPRO) IVPB 400 mg     400 mg 200 mL/hr over  60 Minutes Intravenous  Once 07/17/18 2147 07/18/18 0050      Objective: Vitals:   07/19/18 2141 07/20/18 0457  BP: (!) 149/92 (!) 156/87  Pulse: 82 80  Resp: 18 18  Temp: 98.5 F (36.9 C) 99.9 F (37.7 C)  SpO2: 100% 100%    Intake/Output Summary (Last 24 hours) at 07/20/2018 1123 Last data filed at 07/20/2018 0111 Gross per 24 hour  Intake 1115.6 ml  Output 750 ml  Net 365.6 ml   Filed Weights   07/17/18 1807  Weight: 95.3 kg   Body mass index is 31.01 kg/m.    Physical Exam: General: Obese.  Alert awake communicative.  Anxious and chronically ill. HENT: Normocephalic, pupils equally reacting to light and accommodation.  No scleral pallor or icterus noted. Oral mucosa is moist.  Chest:  Clear breath sounds.  Diminished breath sounds bilaterally. No crackles or wheezes.  CVS: S1 &S2 heard. No murmur.  Regular rate and rhythm. Abdomen: Soft, nontender, nondistended.  Bowel sounds are heard.  Liver is not palpable, no abdominal mass palpated Extremities: No cyanosis, clubbing but trace edema..  Peripheral pulses are palpable. Psych: Alert, awake and oriented, normal mood CNS:  No cranial nerve deficits.  Power equal in all extremities.  No sensory deficits noted.  No cerebellar signs.   Skin: Warm and dry.  Sacral decubitus ulceration present on admission.   Data Review: I have personally reviewed the following laboratory data and studies,  CBC: Recent Labs  Lab 07/14/18 0204 07/17/18 1854 07/18/18 0500 07/19/18 0551 07/20/18 0534  WBC 8.5 8.3 7.9 6.8 5.8  NEUTROABS  --  4.3 4.4 3.4 2.9  HGB 13.6 13.8 12.2 10.8* 11.3*  HCT 42.0 46.8* 40.5 34.6* 37.3  MCV 82.7 88.3 87.9 84.8 87.4  PLT 243 246 229 112* 188   Basic Metabolic Panel: Recent Labs  Lab 07/14/18 0204 07/17/18 1854 07/18/18 0500  NA 136 139 139  K 4.0 4.2 3.6  CL 105 104 105  CO2 24 27 25   GLUCOSE 167* 203* 174*  BUN 14 20 15   CREATININE 0.84 0.91 0.73  CALCIUM 9.2 9.5 8.9  MG  --  1.8  --    Liver Function Tests: Recent Labs  Lab 07/17/18 1854 07/18/18 0500  AST 26 17  ALT 28 22  ALKPHOS 119 96  BILITOT 1.1 0.9  PROT 8.4* 6.7  ALBUMIN 3.7 2.9*   No results for input(s): LIPASE, AMYLASE in the last 168 hours. No results for input(s): AMMONIA in the last 168 hours. Cardiac Enzymes: Recent Labs  Lab 07/17/18 1854  CKTOTAL 44   BNP (last 3 results) No results for input(s): BNP in the last 8760 hours.  ProBNP (last 3 results) No results for input(s):  PROBNP in the last 8760 hours.  CBG: Recent Labs  Lab 07/19/18 1141 07/19/18 1704 07/19/18 1729 07/20/18 0105 07/20/18 0757  GLUCAP 116* 69* 76 131* 86   Recent Results (from the past 240 hour(s))  Blood Culture (routine x 2)     Status: None (Preliminary result)   Collection Time: 07/17/18  6:13 PM  Result Value Ref Range Status   Specimen Description   Final    BLOOD RIGHT FOREARM Performed at [facility], [address] [place], [place] [address]    Special Requests   Final    BOTTLES DRAWN AEROBIC AND ANAEROBIC Blood Culture adequate volume Performed at [facility], [address], [place], [place] [address]  Culture   Final    NO GROWTH 2 DAYS Performed at [facility], [address], [place], [place] [address]    Report Status PENDING  Incomplete  Blood Culture (routine x 2)     Status: None (Preliminary result)   Collection Time: 07/17/18  6:48 PM  Result Value Ref Range Status   Specimen Description   Final    BLOOD LEFT FOREARM Performed at [facility], [address], [place], [place] [address]    Special Requests   Final    BOTTLES DRAWN AEROBIC AND ANAEROBIC Blood Culture adequate volume Performed at [facility], [address] [place], [place] [address]    Culture   Final    NO GROWTH 2 DAYS Performed at [facility], [address], [place], [place] [address]    Report Status PENDING  Incomplete     Studies: No results found.  Scheduled Meds: . allopurinol  100 mg Oral BID  . apixaban  5 mg Oral BID  . atorvastatin  80 mg Oral Daily  . feeding supplement (GLUCERNA SHAKE)  237 mL Oral TID BM  . gabapentin  300 mg Oral BID  . insulin aspart  0-9 Units Subcutaneous TID WC  . [START ON 07/21/2018] insulin glargine  20 Units Subcutaneous Daily  . metoprolol succinate  50 mg Oral Daily  . nystatin  5 mL Oral QID  . pantoprazole  40 mg Oral Daily  . polyethylene glycol  17 g Oral  Daily    Continuous Infusions: . sodium chloride 100 mL/hr at 07/19/18 1537  . cefTRIAXone (ROCEPHIN)  IV 1 g (07/20/18 1003)  . sodium chloride Stopped (07/18/18 0024)    Joycelyn Das, MD  [facility] 07/20/2018

## 2018-07-21 LAB — CBC WITH DIFFERENTIAL/PLATELET
Abs Immature Granulocytes: 0.02 10*3/uL (ref 0.00–0.07)
Basophils Absolute: 0.1 10*3/uL (ref 0.0–0.1)
Basophils Relative: 1 %
EOS ABS: 0.1 10*3/uL (ref 0.0–0.5)
Eosinophils Relative: 2 %
HCT: 40.8 % (ref 36.0–46.0)
Hemoglobin: 12 g/dL (ref 12.0–15.0)
IMMATURE GRANULOCYTES: 0 %
Lymphocytes Relative: 32 %
Lymphs Abs: 1.9 10*3/uL (ref 0.7–4.0)
MCH: 25.9 pg — ABNORMAL LOW (ref 26.0–34.0)
MCHC: 29.4 g/dL — ABNORMAL LOW (ref 30.0–36.0)
MCV: 87.9 fL (ref 80.0–100.0)
Monocytes Absolute: 0.5 10*3/uL (ref 0.1–1.0)
Monocytes Relative: 9 %
Neutro Abs: 3.3 10*3/uL (ref 1.7–7.7)
Neutrophils Relative %: 56 %
Platelets: 181 10*3/uL (ref 150–400)
RBC: 4.64 MIL/uL (ref 3.87–5.11)
RDW: 15.2 % (ref 11.5–15.5)
WBC: 5.9 10*3/uL (ref 4.0–10.5)
nRBC: 0 % (ref 0.0–0.2)

## 2018-07-21 LAB — BASIC METABOLIC PANEL
Anion gap: 8 (ref 5–15)
BUN: 10 mg/dL (ref 8–23)
CO2: 23 mmol/L (ref 22–32)
Calcium: 8.5 mg/dL — ABNORMAL LOW (ref 8.9–10.3)
Chloride: 106 mmol/L (ref 98–111)
Creatinine, Ser: 0.63 mg/dL (ref 0.44–1.00)
GFR calc Af Amer: >60 mL/min (ref >60)
GFR calc non Af Amer: >60 mL/min (ref >60)
Glucose, Bld: 155 mg/dL — ABNORMAL HIGH (ref 70–99)
Potassium: 3.3 mmol/L — ABNORMAL LOW (ref 3.5–5.1)
Sodium: 137 mmol/L (ref 135–145)

## 2018-07-21 LAB — GLUCOSE, CAPILLARY
GLUCOSE-CAPILLARY: 218 mg/dL — AB (ref 70–99)
Glucose-Capillary: 136 mg/dL — ABNORMAL HIGH (ref 70–99)
Glucose-Capillary: 204 mg/dL — ABNORMAL HIGH (ref 70–99)
Glucose-Capillary: 203 mg/dL — ABNORMAL HIGH (ref 70–99)

## 2018-07-21 MED ORDER — POTASSIUM CHLORIDE CRYS ER 20 MEQ PO TBCR
40.0000 meq | EXTENDED_RELEASE_TABLET | Freq: Every day | ORAL | Status: DC
  Administered 2018-07-22: 40 meq via ORAL
  Filled 2018-07-21: qty 2

## 2018-07-21 MED ORDER — FLUCONAZOLE 100 MG PO TABS
100.0000 mg | ORAL_TABLET | Freq: Every day | ORAL | Status: DC
  Administered 2018-07-21 – 2018-07-22 (×2): 100 mg via ORAL
  Filled 2018-07-21 (×2): qty 1

## 2018-07-21 MED ORDER — POTASSIUM CHLORIDE CRYS ER 20 MEQ PO TBCR
40.0000 meq | EXTENDED_RELEASE_TABLET | Freq: Once | ORAL | Status: AC
  Administered 2018-07-21: 40 meq via ORAL
  Filled 2018-07-21: qty 2

## 2018-07-21 NOTE — Evaluation (Signed)
Physical Therapy Evaluation Patient Details Name: Alyssa Haley MRN: [id number] DOB: [birth year] Today's Date: 07/21/2018   History of Present Illness  68 y.o. female admitted with sepsis. Hx of CAD, type 2 diabetes, GERD, gout, DM, multiple episodes of UTI.   Clinical Impression  Bed level eval only. Pt was unwilling to participate meaningfully during session. She participated minimally with UE and LE assessment. She would not follow commands or initiate any mobility despite encouragement from therapist. No family present during session. Recommend SNF unless family feels they can manage pt's current level of care.     Follow Up Recommendations SNF    Equipment Recommendations  None recommended by PT    Recommendations for Other Services       Precautions / Restrictions Precautions Precautions: Fall Restrictions Weight Bearing Restrictions: No      Mobility  Bed Mobility        General bed mobility comments: NT-pt would not participate/follow commands  Transfers       Ambulation/Gait                Stairs            Wheelchair Mobility    Modified Rankin (Stroke Patients Only)       Balance                                             Pertinent Vitals/Pain Pain Assessment: Faces Faces Pain Scale: No hurt    Home Living Family/patient expects to be discharged to:: Unsure Living Arrangements: Children Available Help at Discharge: Family;Available 24 hours/day Type of Home: House Home Access: Stairs to enter   [employer] of Steps: 1 per pt Home Layout: One level Home Equipment: Walker - 2 wheels Additional Comments: Unsure of accuracy of home information give cognitive deficits.     Prior Function Level of Independence: Needs assistance   Gait / Transfers Assistance Needed: Pt reports using RW for mobility   ADL's / Homemaking Assistance Needed: Reports her daughter assists with ADL tasks occasionally.    Comments: info taken from previous admission-pt would not answer questions during history taking     Hand Dominance        Extremity/Trunk Assessment   Upper Extremity Assessment Upper Extremity Assessment: Generalized weakness    Lower Extremity Assessment Lower Extremity Assessment: RLE deficits/detail;LLE deficits/detail RLE Deficits / Details: unable to accurately assess due to pt's unwillingness to participate. Strength at least 2/5 LLE Deficits / Details: unable to accurately assess due to pt's unwillingness to participate. Strength at least 2/5       Communication   Communication: No difficulties  Cognition Arousal/Alertness: Awake/alert Behavior During Therapy: Flat affect Overall Cognitive Status: No family/caregiver present to determine baseline cognitive functioning                                 General Comments: Pt was unable, or unwilling, to answer questions      General Comments      Exercises     Assessment/Plan    PT Assessment Patient needs continued PT services  PT Problem List Decreased strength;Decreased balance;Decreased cognition;Decreased mobility;Decreased activity tolerance;Decreased knowledge of use of DME       PT Treatment Interventions DME instruction;Functional mobility training;Gait training;Therapeutic activities;Therapeutic exercise;Balance training;Patient/family education    PT  Goals (Current goals can be found in the Care Plan section)  Acute Rehab PT Goals Patient Stated Goal: none stated PT Goal Formulation: Patient unable to participate in goal setting Time For Goal Achievement: 08/04/18 Potential to Achieve Goals: Poor    Frequency Min 2X/week   Barriers to discharge        Co-evaluation               AM-PAC PT "6 Clicks" Mobility  Outcome Measure Help needed turning from your back to your side while in a flat bed without using bedrails?: Total Help needed moving from lying on your back  to sitting on the side of a flat bed without using bedrails?: Total Help needed moving to and from a bed to a chair (including a wheelchair)?: Total Help needed standing up from a chair using your arms (e.g., wheelchair or bedside chair)?: Total Help needed to walk in hospital room?: Total Help needed climbing 3-5 steps with a railing? : Total 6 Click Score: 6    End of Session   Activity Tolerance: Patient tolerated treatment well Patient left: in bed;with call bell/phone within reach;with bed alarm set   PT Visit Diagnosis: Muscle weakness (generalized) (M62.81)    Time: 9753-0051 PT Time Calculation (min) (ACUTE ONLY): 9 min   Charges:   PT Evaluation $PT Eval Moderate Complexity: 1 Mod            Rebeca Alert, PT Acute Rehabilitation Services Pager: [phone] Office: [phone]

## 2018-07-21 NOTE — Evaluation (Signed)
Occupational Therapy Evaluation Patient Details Name: Alyssa Haley MRN: [id number] DOB: 1951/03/21 Today's Date: 07/21/2018    History of Present Illness Alyssa Haley is a 68 y.o. female with medical history significant of CAD, type 2 diabetes, GERD, gout, hyperlipidemia, hypertension, sepsis, history of multiple episodes of UTI presented to the emergency department due to progressively worse mentation over the last 3 days, in which the patient has been mostly staying in bed   Clinical Impression   Pt admitted with UTI/sepsis. Pt currently with functional limitations due to the deficits listed below (see OT Problem List).  Pt will benefit from skilled OT to increase their safety and independence with ADL and functional mobility for ADL to facilitate discharge to venue listed below.      Follow Up Recommendations  SNF    Equipment Recommendations  None recommended by OT    Recommendations for Other Services       Precautions / Restrictions Precautions Precautions: Fall Restrictions Weight Bearing Restrictions: No      Mobility Bed Mobility Overal bed mobility: Needs Assistance Bed Mobility: Rolling;Supine to Sit Rolling: Mod assist   Supine to sit: Max assist     General bed mobility comments: pt needed max cues and HOB up and rail to get to sitting wiht max assist.  pt would attempt to scoot forward but unable to move herself.  she needed me to pull the chuck to help her get scooted out  Transfers Overall transfer level: Needs assistance   Transfers: Sit to/from Stand;Stand Pivot Transfers Sit to Stand: +2 physical assistance;From elevated surface;Max assist Stand pivot transfers: Max assist;+2 physical assistance;From elevated surface       General transfer comment: Pt was irritated and wouldnt help -  kept saying to leave her alone and step back and she would do it herself (but she wasnt attempting it).  Pt needed 3 attempts to get high enough to get onto  her feet to be able to turn and pivot to chair with +2 max assist.  Pt was incontinent of urine during transfer.  I explained to pt how she is going to have to work to get stronger prior to going home (she keeps saying she is going home)_        ADL either performed or assessed with clinical judgement   ADL Overall ADL's : Needs assistance/impaired Eating/Feeding: Minimal assistance Eating/Feeding Details (indicate cue type and reason): sitting EOB eating grapes. Mod A for sitting balance at times.  Pt would state I am just so tired                                                      Pertinent Vitals/Pain Pain Assessment: No/denies pain     Hand Dominance     Extremity/Trunk Assessment Upper Extremity Assessment Upper Extremity Assessment: Generalized weakness           Communication Communication Communication: Other (comment)(very slow to respond to questions)   Cognition Arousal/Alertness: Lethargic Behavior During Therapy: Flat affect Overall Cognitive Status: No family/caregiver present to determine baseline cognitive functioning                                 General Comments: slow speaking with OT.  Did now she was in hospital for  UTI but wanted to knowif she could call her ride   General Comments               Home Living Family/patient expects to be discharged to:: Private residence Living Arrangements: Children Available Help at Discharge: Family;Available 24 hours/day Type of Home: House Home Access: Stairs to enter [employer] of Steps: 1 per pt   Home Layout: One level     Bathroom Shower/Tub: Tub/shower unit         Home Equipment: Environmental consultant - 2 wheels   Additional Comments: Unsure of accuracy of home information give cognitive deficits.       Prior Functioning/Environment Level of Independence: Needs assistance  Gait / Transfers Assistance Needed: Pt reports using RW for mobility  ADL's /  Homemaking Assistance Needed: Reports her daughter assists with ADL tasks occasionally.             OT Problem List: Decreased strength;Decreased activity tolerance;Impaired balance (sitting and/or standing);Obesity;Decreased safety awareness;Decreased knowledge of use of DME or AE      OT Treatment/Interventions: Self-care/ADL training;Patient/family education;DME and/or AE instruction    OT Goals(Current goals can be found in the care plan section) Acute Rehab OT Goals Patient Stated Goal: call someone to come pick me up OT Goal Formulation: With patient Time For Goal Achievement: 08/04/18 Potential to Achieve Goals: Good ADL Goals Pt Will Perform Eating: with set-up Pt Will Perform Grooming: with set-up Pt Will Transfer to Toilet: with min assist;bedside commode;stand pivot transfer Pt Will Perform Toileting - Clothing Manipulation and hygiene: with min assist;sit to/from stand;sitting/lateral leans  OT Frequency: Min 2X/week   Barriers to D/C: Decreased caregiver support          Co-evaluation              AM-PAC OT "6 Clicks" Daily Activity     Outcome Measure Help from another person eating meals?: A Little Help from another person taking care of personal grooming?: A Little Help from another person toileting, which includes using toliet, bedpan, or urinal?: Total Help from another person bathing (including washing, rinsing, drying)?: A Lot Help from another person to put on and taking off regular upper body clothing?: A Lot Help from another person to put on and taking off regular lower body clothing?: Total 6 Click Score: 12   End of Session Nurse Communication: Mobility status  Activity Tolerance: Patient limited by fatigue Patient left: in bed;with call bell/phone within reach;with bed alarm set  OT Visit Diagnosis: Unsteadiness on feet (R26.81);Muscle weakness (generalized) (M62.81);Other abnormalities of gait and mobility (R26.89);History of falling  (Z91.81)                Time: 3419-6222 OT Time Calculation (min): 32 min Charges:  OT General Charges $OT Visit: 1 Visit OT Evaluation $OT Eval Moderate Complexity: 1 Mod OT Treatments $Self Care/Home Management : 8-22 mins  Lise Auer, OT Acute Rehabilitation Services [phone] Office- [phone]     Waylen Depaolo, Karin Golden D 07/21/2018, 1:45 PM

## 2018-07-21 NOTE — Progress Notes (Signed)
PROGRESS NOTE  Alyssa Haley JGO:[id number] DOB: Aug 06, 1950 DOA: 07/17/2018 PCP: [name], MD   LOS: 4 days   Brief narrative:  Alyssa Haley is a 68 y.o. female with medical history significant of CAD, type 2 diabetes, GERD, gout, hyperlipidemia, hypertension, sepsis, history of multiple episodes of UTI presented to the hospital due to progressively worse mentation for 3 days, in which the patient has been mostly staying in bed.  EMS reported that the patient had multiple soaked urinary pads on the bed.  Patient was unable to provide further history.  In the ED, Initial vital signs temperature was 99.3 F, pulse 88, respiration 19, blood pressure 169/97 mmHg O2 sat 100% on room air.  The patient received ciprofloxacin 400 mg IVPB and 2000 mL of NS bolus and was admitted to the hospital.  Subjective: Patient denies any nausea, vomiting, diarrhea, chills or shortness of breath.  Complains of generalized weakness.  Assessment/Plan:  Principal Problem:   Sepsis due to urinary tract infection (HCC) Active Problems:   CAD (coronary artery disease)   Essential hypertension   Uncontrolled type 2 diabetes mellitus with hyperglycemia (HCC)   Gout   GERD (gastroesophageal reflux disease)   Hyperlipidemia   Pressure injury of skin  Sepsis due to urinary tract infection (HCC) Most recent culture showed pansensitive E faecalis. on Rocephin.     Blood cultures negative so far in 4 days.  Urine culture this time showed n significant colonies of yeast.  Will start the patient on Diflucan today.  No leukocytosis or fever.  Lactate improved with hydration.  Mild hypokalemia.  Will replace.    CAD (coronary artery disease) Not taking apixaban, atorvastatin and metoprolol at home which has been resumed here.  Patient was counseled regarding compliance.    Essential hypertension On betablockers    Uncontrolled type 2 diabetes mellitus with hyperglycemia (HCC) Continue IV  hydration.    Continue sliding scale insulin, Lantus diabetic diet and Accu-Cheks.  Overall glycemic control is adequate.    Gout On allopurinol.    GERD (gastroesophageal reflux disease) Continue PPI.    Hyperlipidemia on statin    Pressure injury of skin sacral ulcer stage II, present on admission Continue local care, sacral area.   VTE Prophylaxis: Apixaban  Code Status: Full code  Family Communication: I spoke with the patient's daughter on the phone and updated her about the clinical condition of the patient. I also spoke about the PT recommendation on SNF.  Disposition Plan: Skilled nursing facility . Patient is medically stable for disposition.  Will consult social services for placement.   Consultants:  None  Procedures:  None  Antibiotics: Anti-infectives (From admission, onward)   Start     Dose/Rate Route Frequency Ordered Stop   07/21/18 1000  fluconazole (DIFLUCAN) tablet 100 mg     100 mg Oral Daily 07/21/18 0708     07/18/18 1000  cefTRIAXone (ROCEPHIN) 1 g in sodium chloride 0.9 % 100 mL IVPB  Status:  Discontinued     1 g 200 mL/hr over 30 Minutes Intravenous Every 24 hours 07/17/18 2307 07/18/18 0246   07/18/18 1000  cefTRIAXone (ROCEPHIN) 1 g in sodium chloride 0.9 % 100 mL IVPB     1 g 200 mL/hr over 30 Minutes Intravenous Every 24 hours 07/18/18 0254     07/17/18 2245  levofloxacin (LEVAQUIN) IVPB 750 mg  Status:  Discontinued     750 mg 100 mL/hr over 90 Minutes Intravenous  Once 07/17/18 2241 07/17/18 2306  07/17/18 2200  ciprofloxacin (CIPRO) IVPB 400 mg     400 mg 200 mL/hr over 60 Minutes Intravenous  Once 07/17/18 2147 07/18/18 0050      Objective: Vitals:   07/21/18 1023 07/21/18 1258  BP: (!) 152/82 (!) 142/76  Pulse: 89 82  Resp:  16  Temp:  98.7 F (37.1 C)  SpO2:  100%    Intake/Output Summary (Last 24 hours) at 07/21/2018 1450 Last data filed at 07/21/2018 1200 Gross per 24 hour  Intake 3184.88 ml  Output 1100 ml   Net 2084.88 ml   Filed Weights   07/17/18 1807  Weight: 95.3 kg   Body mass index is 31.01 kg/m.   Physical Exam: General: Obese.  Alert awake and communicative.  Chronically ill. HENT: Normocephalic, pupils equally reacting to light and accommodation.  No scleral pallor or icterus noted. Oral mucosa is moist.  Chest:  Clear breath sounds.  Diminished breath sounds bilaterally. No crackles or wheezes.  CVS: S1 &S2 heard. No murmur.  Regular rate and rhythm. Abdomen: Soft, nontender, nondistended.  Bowel sounds are heard.  Liver is not palpable, no abdominal mass palpated Extremities: No cyanosis, clubbing but trace edema.  Peripheral pulses are palpable. Psych: Alert, awake and communicative. CNS:  No cranial nerve deficits, moves all extremities Skin: Warm and dry.  Sacral decubitus ulceration present on admission.  Data Review: I have personally reviewed the following laboratory data and studies,  CBC: Recent Labs  Lab 07/17/18 1854 07/18/18 0500 07/19/18 0551 07/20/18 0534 07/21/18 0503  WBC 8.3 7.9 6.8 5.8 5.9  NEUTROABS 4.3 4.4 3.4 2.9 3.3  HGB 13.8 12.2 10.8* 11.3* 12.0  HCT 46.8* 40.5 34.6* 37.3 40.8  MCV 88.3 87.9 84.8 87.4 87.9  PLT 246 229 112* 188 181   Basic Metabolic Panel: Recent Labs  Lab 07/17/18 1854 07/18/18 0500 07/21/18 0503  NA 139 139 137  K 4.2 3.6 3.3*  CL 104 105 106  CO2 27 25 23   GLUCOSE 203* 174* 155*  BUN 20 15 10   CREATININE 0.91 0.73 0.63  CALCIUM 9.5 8.9 8.5*  MG 1.8  --   --    Liver Function Tests: Recent Labs  Lab 07/17/18 1854 07/18/18 0500  AST 26 17  ALT 28 22  ALKPHOS 119 96  BILITOT 1.1 0.9  PROT 8.4* 6.7  ALBUMIN 3.7 2.9*   No results for input(s): LIPASE, AMYLASE in the last 168 hours. No results for input(s): AMMONIA in the last 168 hours. Cardiac Enzymes: Recent Labs  Lab 07/17/18 1854  CKTOTAL 44   BNP (last 3 results) No results for input(s): BNP in the last 8760 hours.  ProBNP (last 3 results)  No results for input(s): PROBNP in the last 8760 hours.  CBG: Recent Labs  Lab 07/20/18 1214 07/20/18 1630 07/20/18 2112 07/21/18 0757 07/21/18 1204  GLUCAP 177* 200* 178* 136* 204*   Recent Results (from the past 240 hour(s))  Urine culture     Status: Abnormal   Collection Time: 07/17/18  6:08 PM  Result Value Ref Range Status   Specimen Description   Final    URINE, CLEAN CATCH Performed at [facility], [address] [place], [place] [address]    Special Requests   Final    NONE Performed at [facility], [address], [place], [place] [address]    Culture >=100,000 COLONIES/mL YEAST (A)  Final   Report Status 07/20/2018 FINAL  Final  Blood Culture (routine  x 2)     Status: None (Preliminary result)   Collection Time: 07/17/18  6:13 PM  Result Value Ref Range Status   Specimen Description   Final    BLOOD RIGHT FOREARM Performed at [facility], [address] [place], [place] [address]    Special Requests   Final    BOTTLES DRAWN AEROBIC AND ANAEROBIC Blood Culture adequate volume Performed at [facility], [address] [place], [place] [address]    Culture   Final    NO GROWTH 4 DAYS Performed at [facility], [address], [place], [place] [address]    Report Status PENDING  Incomplete  Blood Culture (routine x 2)     Status: None (Preliminary result)   Collection Time: 07/17/18  6:48 PM  Result Value Ref Range Status   Specimen Description   Final    BLOOD LEFT FOREARM Performed at [facility], [address] [place], [place] [address]    Special Requests   Final    BOTTLES DRAWN AEROBIC AND ANAEROBIC Blood Culture adequate volume Performed at [facility], [address] [place], [place] [address]    Culture   Final    NO GROWTH 4 DAYS Performed at [facility], [address] [place], [place] [address]    Report Status PENDING   Incomplete     Studies: No results found.  Scheduled Meds: . allopurinol  100 mg Oral BID  . apixaban  5 mg Oral BID  . atorvastatin  80 mg Oral Daily  . feeding supplement (GLUCERNA SHAKE)  237 mL Oral TID BM  . fluconazole  100 mg Oral Daily  . gabapentin  300 mg Oral BID  . insulin aspart  0-9 Units Subcutaneous TID WC  . insulin glargine  20 Units Subcutaneous Daily  . metoprolol succinate  50 mg Oral Daily  . nystatin  5 mL Oral QID  . pantoprazole  40 mg Oral Daily  . polyethylene glycol  17 g Oral Daily    Continuous Infusions: . sodium chloride 100 mL/hr at 07/21/18 1200  . cefTRIAXone (ROCEPHIN)  IV 1 g (07/21/18 1030)  . sodium chloride Stopped (07/18/18 0024)    Joycelyn Das, MD  [facility] 07/21/2018

## 2018-07-21 NOTE — Care Management Important Message (Signed)
Important Message  Patient Details  Name: Laniesha Smeriglio MRN: [id number] Date of Birth: 12-28-1950   Medicare Important Message Given:  Yes    Caren Macadam 07/21/2018, 1:11 PMImportant Message  Patient Details  Name: Masae Comi MRN: [id number] Date of Birth: Oct 16, 1950   Medicare Important Message Given:  Yes    Caren Macadam 07/21/2018, 1:11 PM

## 2018-07-22 DIAGNOSIS — I2510 Atherosclerotic heart disease of native coronary artery without angina pectoris: Secondary | ICD-10-CM

## 2018-07-22 LAB — BASIC METABOLIC PANEL
Anion gap: 5 (ref 5–15)
BUN: 11 mg/dL (ref 8–23)
CO2: 26 mmol/L (ref 22–32)
Calcium: 8.6 mg/dL — ABNORMAL LOW (ref 8.9–10.3)
Chloride: 109 mmol/L (ref 98–111)
Creatinine, Ser: 0.66 mg/dL (ref 0.44–1.00)
GFR calc Af Amer: >60 mL/min (ref >60)
GFR calc non Af Amer: >60 mL/min (ref >60)
Glucose, Bld: 157 mg/dL — ABNORMAL HIGH (ref 70–99)
Potassium: 3.6 mmol/L (ref 3.5–5.1)
Sodium: 140 mmol/L (ref 135–145)

## 2018-07-22 LAB — CBC
HCT: 34.9 % — ABNORMAL LOW (ref 36.0–46.0)
Hemoglobin: 10.7 g/dL — ABNORMAL LOW (ref 12.0–15.0)
MCH: 26.6 pg (ref 26.0–34.0)
MCHC: 30.7 g/dL (ref 30.0–36.0)
MCV: 86.6 fL (ref 80.0–100.0)
Platelets: 180 10*3/uL (ref 150–400)
RBC: 4.03 MIL/uL (ref 3.87–5.11)
RDW: 15.2 % (ref 11.5–15.5)
WBC: 6.6 10*3/uL (ref 4.0–10.5)
nRBC: 0 % (ref 0.0–0.2)

## 2018-07-22 LAB — CULTURE, BLOOD (ROUTINE X 2)
Culture: NO GROWTH
Culture: NO GROWTH
Special Requests: ADEQUATE
Special Requests: ADEQUATE

## 2018-07-22 LAB — GLUCOSE, CAPILLARY
GLUCOSE-CAPILLARY: 123 mg/dL — AB (ref 70–99)
Glucose-Capillary: 145 mg/dL — ABNORMAL HIGH (ref 70–99)
Glucose-Capillary: 152 mg/dL — ABNORMAL HIGH (ref 70–99)

## 2018-07-22 MED ORDER — ONDANSETRON HCL 4 MG PO TABS: 4.0000 mg | ORAL_TABLET | Freq: Four times a day (QID) | ORAL | Status: AC | PRN

## 2018-07-22 MED ORDER — FLUCONAZOLE 100 MG PO TABS: 100.0000 mg | ORAL_TABLET | Freq: Every day | ORAL | 0 refills | Status: AC

## 2018-07-22 MED ORDER — OXYCODONE-ACETAMINOPHEN 10-325 MG PO TABS: 1.0000 | ORAL_TABLET | Freq: Four times a day (QID) | ORAL | 0 refills | Status: AC | PRN

## 2018-07-22 MED ORDER — POTASSIUM CHLORIDE CRYS ER 20 MEQ PO TBCR: 20.0000 meq | EXTENDED_RELEASE_TABLET | Freq: Every day | ORAL | 0 refills | Status: DC

## 2018-07-22 MED ORDER — NYSTATIN 100000 UNIT/ML MT SUSP: 5.0000 mL | Freq: Four times a day (QID) | OROMUCOSAL | 0 refills | Status: AC

## 2018-07-22 NOTE — NC FL2 (Signed)
[place] MEDICAID FL2 LEVEL OF CARE SCREENING TOOL     IDENTIFICATION  Patient Name: Alyssa Haley Birthdate: 1950-08-04 Sex: female Admission Date (Current Location): 07/17/2018  [facility] and [place] Number:      Facility and Address:  [facility],  [address] [place] [address]      Provider Number: [id number]  Attending Physician Name and Address:  Joycelyn Das, MD  Relative Name and Phone Number:  Ival Bible, daughter, [phone]    Current Level of Care: Hospital Recommended Level of Care: Skilled Nursing Facility Prior Approval Number:    Date Approved/Denied:   PASRR Number: [id number]  Discharge Plan: SNF    Current Diagnoses: Patient Active Problem List   Diagnosis Date Noted  . Sepsis due to urinary tract infection (HCC) 07/07/2018  . GERD (gastroesophageal reflux disease) 07/07/2018  . Hyperlipidemia 07/07/2018  . Pressure injury of skin 07/07/2018  . Sepsis (HCC) 05/28/2018  . CAD (coronary artery disease) 05/28/2018  . Essential hypertension 05/28/2018  . Uncontrolled type 2 diabetes mellitus with hyperglycemia (HCC) 05/28/2018  . Gout 05/28/2018  . Acute lower UTI 05/28/2018  . Swelling of joint of left knee 05/28/2018  . Sepsis secondary to UTI (HCC) 05/28/2018    Orientation RESPIRATION BLADDER Height & Weight     Self  Normal Incontinent, External catheter Weight: 210 lb (95.3 kg) Height:  5\' 9"  (175.3 cm)  BEHAVIORAL SYMPTOMS/MOOD NEUROLOGICAL BOWEL NUTRITION STATUS      Incontinent Diet(carb modified)  AMBULATORY STATUS COMMUNICATION OF NEEDS Skin   Extensive Assist Verbally PU Stage and Appropriate Care(stage 3 on sacrum, heels are red)                       Personal Care Assistance Level of Assistance  Bathing, Feeding, Dressing Bathing Assistance: Limited assistance Feeding assistance: Limited assistance Dressing Assistance: Limited assistance     Functional Limitations Info  Sight, Hearing, Speech  Sight Info: Adequate Hearing Info: Adequate Speech Info: Adequate    SPECIAL CARE FACTORS FREQUENCY  PT (By licensed PT), OT (By licensed OT)     PT Frequency: 5x wk OT Frequency: 5x wk            Contractures Contractures Info: Not present    Additional Factors Info  Code Status, Allergies, Insulin Sliding Scale Code Status Info: full Allergies Info: penicillins   Insulin Sliding Scale Info: 3x daily w/ meal       Current Medications (07/22/2018):  This is the current hospital active medication list Current Facility-Administered Medications  Medication Dose Route Frequency Provider Last Rate Last Dose  . acetaminophen (TYLENOL) tablet 650 mg  650 mg Oral Q6H PRN Bobette Mo, MD       Or  . acetaminophen (TYLENOL) suppository 650 mg  650 mg Rectal Q6H PRN Bobette Mo, MD      . allopurinol (ZYLOPRIM) tablet 100 mg  100 mg Oral BID Pokhrel, Laxman, MD   100 mg at 07/22/18 0856  . apixaban (ELIQUIS) tablet 5 mg  5 mg Oral BID Pokhrel, Laxman, MD   5 mg at 07/22/18 0857  . atorvastatin (LIPITOR) tablet 80 mg  80 mg Oral Daily Pokhrel, Laxman, MD   80 mg at 07/22/18 0856  . feeding supplement (GLUCERNA SHAKE) (GLUCERNA SHAKE) liquid 237 mL  237 mL Oral TID BM Pokhrel, Laxman, MD   237 mL at 07/22/18 0902  . fluconazole (DIFLUCAN) tablet 100 mg  100 mg Oral Daily Pokhrel, Laxman, MD  100 mg at 07/22/18 0856  . gabapentin (NEURONTIN) capsule 300 mg  300 mg Oral BID Pokhrel, Laxman, MD   300 mg at 07/22/18 0856  . insulin aspart (novoLOG) injection 0-9 Units  0-9 Units Subcutaneous TID WC Bobette Mo, MD   1 Units at 07/22/18 [phone]  . insulin glargine (LANTUS) injection 20 Units  20 Units Subcutaneous Daily Pokhrel, Laxman, MD   20 Units at 07/22/18 0856  . metoprolol succinate (TOPROL-XL) 24 hr tablet 50 mg  50 mg Oral Daily Pokhrel, Laxman, MD   50 mg at 07/22/18 0902  . nystatin (MYCOSTATIN) 100000 UNIT/ML suspension 500,000 Units  5 mL Oral QID Pokhrel,  Laxman, MD   500,000 Units at 07/22/18 0902  . ondansetron (ZOFRAN) tablet 4 mg  4 mg Oral Q6H PRN Bobette Mo, MD       Or  . ondansetron [facility]) injection 4 mg  4 mg Intravenous Q6H PRN Bobette Mo, MD      . oxyCODONE-acetaminophen (PERCOCET/ROXICET) 5-325 MG per tablet 1 tablet  1 tablet Oral Q6H PRN Pokhrel, Laxman, MD   1 tablet at 07/20/18 1013   And  . oxyCODONE (Oxy IR/ROXICODONE) immediate release tablet 5 mg  5 mg Oral Q6H PRN Pokhrel, Laxman, MD   5 mg at 07/18/18 2311  . pantoprazole (PROTONIX) EC tablet 40 mg  40 mg Oral Daily Pokhrel, Laxman, MD   40 mg at 07/22/18 0856  . polyethylene glycol (MIRALAX / GLYCOLAX) packet 17 g  17 g Oral Daily Pokhrel, Laxman, MD   17 g at 07/22/18 0857  . potassium chloride SA (K-DUR,KLOR-CON) CR tablet 40 mEq  40 mEq Oral Daily Pokhrel, Laxman, MD   40 mEq at 07/22/18 0855  . sodium chloride 0.9 % bolus 1,000 mL  1,000 mL Intravenous Once Bobette Mo, MD   Stopped at 07/18/18 0024     Discharge Medications: Please see discharge summary for a list of discharge medications.  Relevant Imaging Results:  Relevant Lab Results:   Additional Information SS#[id number]  Althea Charon, [place]

## 2018-07-22 NOTE — Discharge Summary (Signed)
Physician Discharge Summary  Alyssa Haley ZOX:[id number] DOB: Mar 21, 1951 DOA: 07/17/2018  PCP: [name], MD  Admit date: 07/17/2018 Discharge date: 07/22/2018  Admitted From: Home  Discharge disposition: SNF   Recommendations for Outpatient Follow-Up:    Follow up with your primary care provider at the skilled nursing facility in 3 to 5 days.  Obtain CBC, BMP at that time.   Discharge Diagnosis:   Principal Problem:   Sepsis due to urinary tract infection (HCC) Active Problems:   CAD (coronary artery disease)   Essential hypertension   Uncontrolled type 2 diabetes mellitus with hyperglycemia (HCC)   Gout   GERD (gastroesophageal reflux disease)   Hyperlipidemia   Pressure injury of skin   Discharge Condition: Improved.  Diet recommendation: Carbohydrate-modified.    Wound care: Continue wound care.  Code status: Full.   History of Present Illness:   Alyssa Haley a 68 y.o.femalewith medical history significant ofCAD, type 2 diabetes, GERD, gout, hyperlipidemia, hypertension, sepsis, history of multiple episodes of UTI presented to the hospital due to progressively worse mentation for 3 days, in which the patient has been mostly staying in bed. EMS reported that the patient had multiple soaked urinary pads on the bed.Patient was unable to provide further history.  In the ED,Initial vital signs temperature was 99.3 F, pulse 88, respiration 19, blood pressure 169/97 mmHg O2 sat 100% on room air. The patient received ciprofloxacin 400 mg IVPB and 2000 mL of NS bolus and was admitted to the hospital.   Hospital Course:  Following conditions were addressed after hospitalization,  Sepsis due to urinary tract infection (HCC) Most recent culture showed pansensitive E faecalis.  Patient initially received iv Rocephin. Blood cultures were negative  in 4 days.  Urine culture this time showed  significant colonies of yeast.  Patient was started  on  Diflucan orally.    Complete the course of Diflucan on discharge.  No leukocytosis or fever.  Lactate improved with hydration.  Mild hypokalemia.   improved with replacement.  We will give her potassium for few days on discharge.  Please check BMP in 3 to 5 days.  CAD (coronary artery disease) Not taking apixaban, atorvastatin and metoprolol at home which has been resumed here.  Patient was counseled regarding compliance on medications on discharge.  Essential hypertension On beta-blockers.  Remained stable  type 2 diabetes mellitus with hyperglycemia [facility]) Patient record was adequate during hospitalization.  Patient was continued on sliding scale insulin Accu-Cheks diabetic diet and Lantus.  Will resume home regimen on discharge.  Last A1c of 11.1 on 05/27/2018.  Gout On allopurinol, continue  Hyperlipidemia on statin  Pressure injury of skin sacral ulcer stage II, present on admission Continue wound care on discharge  Disposition. At this time, patient is stable for disposition home.  I have spoken with the patient's daughter on the phone yesterday regarding the disposition plan.  Medical Consultants:    None.   Subjective:   Today, patient feels okay.  Complains of mild back pain.  Denies any fever, chills, nausea vomiting or abdominal pain.  Discharge Exam:   Vitals:   07/21/18 2107 07/22/18 0515  BP: (!) 152/82 134/77  Pulse: 77 71  Resp: 18 18  Temp: 99.1 F (37.3 C) 98.2 F (36.8 C)  SpO2: 98% 98%   Vitals:   07/21/18 1023 07/21/18 1258 07/21/18 2107 07/22/18 0515  BP: (!) 152/82 (!) 142/76 (!) 152/82 134/77  Pulse: 89 82 77 71  Resp:  16 18  18  Temp:  98.7 F (37.1 C) 99.1 F (37.3 C) 98.2 F (36.8 C)  TempSrc:  Oral    SpO2:  100% 98% 98%  Weight:      Height:        General exam: Appears calm and comfortable ,Not in distress, communicative. HEENT:PERRL,Oral mucosa moist.  Obese.  Chronically ill Respiratory system: Bilateral  equal air entry, normal vesicular breath sounds, no wheezes or crackles  Cardiovascular system: S1 & S2 heard, RRR.  Gastrointestinal system: Abdomen is nondistended, soft and nontender. No organomegaly or masses felt. Normal bowel sounds heard. Central nervous system: Alert and oriented to place and person. No focal neurological deficits. Extremities:  no clubbing ,no cyanosis, distal peripheral pulses palpable.  hyperkeratosis of the lower extremities. Skin: Sacral decubitus ulceration stage II present on admission. MSK: Normal muscle bulk,tone ,power    Procedures:     The results of significant diagnostics from this hospitalization (including imaging, microbiology, ancillary and laboratory) are listed below for reference.     Diagnostic Studies:   Dg Chest 2 View  Result Date: 07/17/2018 CLINICAL DATA:  Sepsis. Pt was reportedly at home and has been laying in bed x3 days. Pt HX: non smoker, DM, HTN EXAM: CHEST - 2 VIEW COMPARISON:  05/28/2018 FINDINGS: Cardiac silhouette is normal in size. No mediastinal or hilar masses. No evidence of adenopathy. Clear lungs.  No pleural effusion or pneumothorax. Skeletal structures are intact. IMPRESSION: No active cardiopulmonary disease. Electronically Signed   By: Amie Portland M.D.   On: 07/17/2018 20:18    Labs:   Basic Metabolic Panel: Recent Labs  Lab 07/17/18 1854 07/18/18 0500 07/21/18 0503 07/22/18 0531  NA 139 139 137 140  K 4.2 3.6 3.3* 3.6  CL 104 105 106 109  CO2 27 25 23 26   GLUCOSE 203* 174* 155* 157*  BUN 20 15 10 11   CREATININE 0.91 0.73 0.63 0.66  CALCIUM 9.5 8.9 8.5* 8.6*  MG 1.8  --   --   --    GFR Estimated Creatinine Clearance: 83.8 mL/min (by C-G formula based on SCr of 0.66 mg/dL). Liver Function Tests: Recent Labs  Lab 07/17/18 1854 07/18/18 0500  AST 26 17  ALT 28 22  ALKPHOS 119 96  BILITOT 1.1 0.9  PROT 8.4* 6.7  ALBUMIN 3.7 2.9*   No results for input(s): LIPASE, AMYLASE in the last 168  hours. No results for input(s): AMMONIA in the last 168 hours. Coagulation profile No results for input(s): INR, PROTIME in the last 168 hours.  CBC: Recent Labs  Lab 07/17/18 1854 07/18/18 0500 07/19/18 0551 07/20/18 0534 07/21/18 0503 07/22/18 0531  WBC 8.3 7.9 6.8 5.8 5.9 6.6  NEUTROABS 4.3 4.4 3.4 2.9 3.3  --   HGB 13.8 12.2 10.8* 11.3* 12.0 10.7*  HCT 46.8* 40.5 34.6* 37.3 40.8 34.9*  MCV 88.3 87.9 84.8 87.4 87.9 86.6  PLT 246 229 112* 188 181 180   Cardiac Enzymes: Recent Labs  Lab 07/17/18 1854  CKTOTAL 44   BNP: Invalid input(s): POCBNP CBG: Recent Labs  Lab 07/21/18 0757 07/21/18 1204 07/21/18 1643 07/21/18 2104 07/22/18 0802  GLUCAP 136* 204* 218* 203* 145*   D-Dimer No results for input(s): DDIMER in the last 72 hours. Hgb A1c No results for input(s): HGBA1C in the last 72 hours. Lipid Profile No results for input(s): CHOL, HDL, LDLCALC, TRIG, CHOLHDL, LDLDIRECT in the last 72 hours. Thyroid function studies No results for input(s): TSH, T4TOTAL, T3FREE, THYROIDAB in  the last 72 hours.  Invalid input(s): FREET3 Anemia work up No results for input(s): VITAMINB12, FOLATE, FERRITIN, TIBC, IRON, RETICCTPCT in the last 72 hours. Microbiology Recent Results (from the past 240 hour(s))  Urine culture     Status: Abnormal   Collection Time: 07/17/18  6:08 PM  Result Value Ref Range Status   Specimen Description   Final    URINE, CLEAN CATCH Performed at [facility], [address] [place], [place] [address]    Special Requests   Final    NONE Performed at [facility], [address], [place], [place] [address]    Culture >=100,000 COLONIES/mL YEAST (A)  Final   Report Status 07/20/2018 FINAL  Final  Blood Culture (routine x 2)     Status: None   Collection Time: 07/17/18  6:13 PM  Result Value Ref Range Status   Specimen Description   Final    BLOOD RIGHT FOREARM Performed at [facility],  [address] [place], [place] [address]    Special Requests   Final    BOTTLES DRAWN AEROBIC AND ANAEROBIC Blood Culture adequate volume Performed at [facility], [address], [place], [place] [address]    Culture   Final    NO GROWTH 5 DAYS Performed at [facility], [address], [place], [place] [address]    Report Status 07/22/2018 FINAL  Final  Blood Culture (routine x 2)     Status: None   Collection Time: 07/17/18  6:48 PM  Result Value Ref Range Status   Specimen Description   Final    BLOOD LEFT FOREARM Performed at [facility], [address] [place], [place] [address]    Special Requests   Final    BOTTLES DRAWN AEROBIC AND ANAEROBIC Blood Culture adequate volume Performed at [facility], [address] [place], [place] [address]    Culture   Final    NO GROWTH 5 DAYS Performed at [facility], [address], [place], [place] [address]    Report Status 07/22/2018 FINAL  Final     Discharge Instructions:   Discharge Instructions    Diet Carb Modified   Complete by:  As directed    Discharge instructions   Complete by:  As directed    Follow-up with primary care provider at the skilled nursing facility in 3 to 5 days.  Check a CBC BMP in 3 to 5 days.  Complete the course of Diflucan.     Allergies as of 07/22/2018      Reactions   Penicillins Hives   Did it involve swelling of the face/tongue/throat, SOB, or low BP? No Did it involve sudden or severe rash/hives, skin peeling, or any reaction on the inside of your mouth or nose? Yes Did you need to seek medical attention at a hospital or doctor's office? No When did it last happen? If all above answers are "NO", may proceed with cephalosporin use.      Medication List    STOP taking these medications   zolpidem 10 MG tablet Commonly known as:  AMBIEN     TAKE these medications   acetaminophen 500 MG tablet Commonly known as:   TYLENOL Take 1,000 mg by mouth every 6 (six) hours as needed for mild pain or headache.   allopurinol 100 MG tablet Commonly known as:  ZYLOPRIM Take 100 mg by mouth 2 (two) times  daily.   apixaban 5 MG Tabs tablet Commonly known as:  ELIQUIS Take 1 tablet (5 mg total) by mouth 2 (two) times daily. To be started 06/04/2018. What changed:    additional instructions  Another medication with the same name was removed. Continue taking this medication, and follow the directions you see here.   atorvastatin 80 MG tablet Commonly known as:  LIPITOR Take 80 mg by mouth daily.   feeding supplement (GLUCERNA SHAKE) Liqd Take 237 mLs by mouth 3 (three) times daily between meals.   fluconazole 100 MG tablet Commonly known as:  DIFLUCAN Take 1 tablet (100 mg total) by mouth daily for 7 days. Start taking on:  July 23, 2018   gabapentin 300 MG capsule Commonly known as:  NEURONTIN Take 1 capsule (300 mg total) by mouth 2 (two) times daily.   insulin aspart 100 UNIT/ML injection Commonly known as:  novoLOG Inject 0-9 Units into the skin 3 (three) times daily with meals. CBG < 70: implement hypoglycemia protocol CBG 70 - 120: 0 units CBG 121 - 150: 1 unit CBG 151 - 200: 2 units CBG 201 - 250: 3 units CBG 251 - 300: 5 units CBG 301 - 350: 7 units CBG 351 - 400: 9 units CBG > 400: call MD.   Insulin Glargine (1 Unit Dial) 300 UNIT/ML Sopn Commonly known as:  Toujeo SoloStar Inject 35 Units into the skin daily. What changed:  how much to take   metoprolol succinate 50 MG 24 hr tablet Commonly known as:  TOPROL-XL Take 50 mg by mouth daily.   mirtazapine 30 MG tablet Commonly known as:  REMERON Take 30 mg by mouth at bedtime.   multivitamin with minerals Tabs tablet Take 1 tablet by mouth daily.   nystatin 100000 UNIT/ML suspension Commonly known as:  MYCOSTATIN Take 5 mLs (500,000 Units total) by mouth 4 (four) times daily.   omeprazole 40 MG capsule Commonly known as:   PRILOSEC Take 40 mg by mouth daily.   ondansetron 4 MG tablet Commonly known as:  ZOFRAN Take 1 tablet (4 mg total) by mouth every 6 (six) hours as needed for nausea.   oxyCODONE-acetaminophen 10-325 MG tablet Commonly known as:  PERCOCET Take 1 tablet by mouth every 6 (six) hours as needed for pain (Moderate - Severe pain.).   OZEMPIC (0.25 OR 0.5 MG/DOSE) Lamar Inject 0.25 mg into the skin once a week.   polyethylene glycol packet Commonly known as:  MiraLax Take 17 g by mouth daily.   potassium chloride SA 20 MEQ tablet Commonly known as:  K-DUR,KLOR-CON Take 1 tablet (20 mEq total) by mouth daily for 5 days. Start taking on:  July 23, 2018      Time coordinating discharge: 39 minutes  Signed:  Senaya Dicenso  [facility] 07/22/2018, 12:15 PM

## 2018-07-22 NOTE — TOC Progression Note (Signed)
Transition of Care [facility]) - Progression Note    Patient Details  Name: Alyssa Haley MRN: [id number] Date of Birth: 12/13/1950  Transition of Care [facility]) CM/SW Contact  Althea Charon, LCSW Phone Number: 07/22/2018, 11:59 AM  Clinical Narrative:  CSW received phone call from daughter Ival Bible in regards to patients discharge plan. Ival Bible stated she would like patient to go to [facility] because she feels they would be able to handle patients acre better.   CSW reached out to facility and they stated they would be able to take patient once medically cleared by MD    Expected Discharge Plan: Skilled Nursing Facility Barriers to Discharge: Other (comment)(unable to get a hold of patients daughter)  Expected Discharge Plan and Services Expected Discharge Plan: Skilled Nursing Facility   Post Acute Care Choice: Skilled Nursing Facility Living arrangements for the past 2 months: Single Family Home, Skilled Nursing Facility Expected Discharge Date: (unknown)                         Social Determinants of Health (SDOH) Interventions    Readmission Risk Interventions 30 Day Unplanned Readmission Risk Score     ED to Hosp-Admission (Current) from 07/17/2018 in [facility] TELEMETRY[facility]UROLOGY [facility]  30 Day Unplanned Readmission Risk Score (%)  20 Filed at 07/22/2018 0801     This score is the patient's risk of an unplanned readmission within 30 days of being discharged (0 -100%). The score is based on dignosis, age, lab data, medications, orders, and past utilization.   Low:  0-14.9   Medium: 15-21.9   High: 22-29.9   Extreme: 30 and above       No flowsheet data found.

## 2018-07-22 NOTE — Progress Notes (Signed)
Report called to [employer] and given to [employer] PTAR has been arranged by SW. Awaiting for transport.

## 2018-07-22 NOTE — TOC Transition Note (Signed)
Transition of Care [facility]) - CM/SW Discharge Note   Patient Details  Name: Alyssa Haley MRN: [id number] Date of Birth: 14-Dec-1950  Transition of Care [facility]) CM/SW Contact:  Althea Charon, LCSW Phone Number: 07/22/2018, 1:50 PM   Clinical Narrative:   Patient is going to [facility] care for short term rehab. CSW made daughter aware of transition. RN to call [phone] (pt going in rm# [id number]) for report    Final next level of care: Skilled Nursing Facility Barriers to Discharge: No Barriers Identified   Patient Goals and CMS Choice Patient states their goals for this hospitalization and ongoing recovery are:: per daughter she just wants patient to be safe CMS Medicare.gov Compare Post Acute Care list provided to:: Other (Comment Required)(pt only orient to self)    Discharge Placement PASRR number recieved: 07/22/18 Existing PASRR number confirmed : 07/22/18          Patient [facility] bed at: [facility] Patient to be transferred to facility by: ptar Name of family member notified: csw spoke with daughter Patient and family notified of of transfer: 07/22/18  Discharge Plan and Services   Post Acute Care Choice: Skilled Nursing Facility                    Social Determinants of Health (SDOH) Interventions     Readmission Risk Interventions Readmission Risk Prevention Plan 07/22/2018  Transportation Screening Complete  PCP or Specialist Appt within 5-7 Days Complete  Home Care Screening Complete  Medication Review (RN CM) Complete
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
## 2019-06-01 ENCOUNTER — Emergency Department (HOSPITAL_COMMUNITY): Payer: Medicare Other

## 2019-06-01 ENCOUNTER — Other Ambulatory Visit: Payer: Self-pay

## 2019-06-01 ENCOUNTER — Emergency Department (HOSPITAL_COMMUNITY)
Admission: EM | Admit: 2019-06-01 | Discharge: 2019-06-02 | Payer: Medicare Other | Attending: Emergency Medicine | Admitting: Emergency Medicine

## 2019-06-01 ENCOUNTER — Encounter (HOSPITAL_COMMUNITY): Payer: Self-pay | Admitting: Emergency Medicine

## 2019-06-01 DIAGNOSIS — I10 Essential (primary) hypertension: Secondary | ICD-10-CM | POA: Insufficient documentation

## 2019-06-01 DIAGNOSIS — Z7901 Long term (current) use of anticoagulants: Secondary | ICD-10-CM | POA: Insufficient documentation

## 2019-06-01 DIAGNOSIS — Z794 Long term (current) use of insulin: Secondary | ICD-10-CM | POA: Diagnosis not present

## 2019-06-01 DIAGNOSIS — F0390 Unspecified dementia without behavioral disturbance: Secondary | ICD-10-CM | POA: Insufficient documentation

## 2019-06-01 DIAGNOSIS — Z96659 Presence of unspecified artificial knee joint: Secondary | ICD-10-CM | POA: Diagnosis not present

## 2019-06-01 DIAGNOSIS — Z79899 Other long term (current) drug therapy: Secondary | ICD-10-CM | POA: Insufficient documentation

## 2019-06-01 DIAGNOSIS — I2510 Atherosclerotic heart disease of native coronary artery without angina pectoris: Secondary | ICD-10-CM | POA: Insufficient documentation

## 2019-06-01 DIAGNOSIS — E119 Type 2 diabetes mellitus without complications: Secondary | ICD-10-CM | POA: Diagnosis not present

## 2019-06-01 DIAGNOSIS — R0789 Other chest pain: Secondary | ICD-10-CM | POA: Diagnosis not present

## 2019-06-01 DIAGNOSIS — Z87891 Personal history of nicotine dependence: Secondary | ICD-10-CM | POA: Insufficient documentation

## 2019-06-01 DIAGNOSIS — Z20822 Contact with and (suspected) exposure to covid-19: Secondary | ICD-10-CM | POA: Insufficient documentation

## 2019-06-01 DIAGNOSIS — R079 Chest pain, unspecified: Secondary | ICD-10-CM | POA: Diagnosis present

## 2019-06-01 HISTORY — DX: Sleep apnea, unspecified: G47.30

## 2019-06-01 LAB — CBC
HCT: 42.1 % (ref 36.0–46.0)
Hemoglobin: 13.2 g/dL (ref 12.0–15.0)
MCH: 27.2 pg (ref 26.0–34.0)
MCHC: 31.4 g/dL (ref 30.0–36.0)
MCV: 86.6 fL (ref 80.0–100.0)
Platelets: 178 10*3/uL (ref 150–400)
RBC: 4.86 MIL/uL (ref 3.87–5.11)
RDW: 16.9 % — ABNORMAL HIGH (ref 11.5–15.5)
WBC: 7 10*3/uL (ref 4.0–10.5)
nRBC: 0 % (ref 0.0–0.2)

## 2019-06-01 LAB — TROPONIN I (HIGH SENSITIVITY)
Troponin I (High Sensitivity): 4 ng/L (ref <18)
Troponin I (High Sensitivity): 4 ng/L (ref <18)

## 2019-06-01 LAB — BASIC METABOLIC PANEL
Anion gap: 10 (ref 5–15)
BUN: 12 mg/dL (ref 8–23)
CO2: 26 mmol/L (ref 22–32)
Calcium: 9.3 mg/dL (ref 8.9–10.3)
Chloride: 103 mmol/L (ref 98–111)
Creatinine, Ser: 0.68 mg/dL (ref 0.44–1.00)
GFR calc Af Amer: >60 mL/min (ref >60)
GFR calc non Af Amer: >60 mL/min (ref >60)
Glucose, Bld: 126 mg/dL — ABNORMAL HIGH (ref 70–99)
Potassium: 4.5 mmol/L (ref 3.5–5.1)
Sodium: 139 mmol/L (ref 135–145)

## 2019-06-01 LAB — CBG MONITORING, ED: Glucose-Capillary: 101 mg/dL — ABNORMAL HIGH (ref 70–99)

## 2019-06-01 LAB — POC SARS CORONAVIRUS 2 AG -  ED: SARS Coronavirus 2 Ag: NEGATIVE

## 2019-06-01 MED ORDER — ASPIRIN 81 MG PO CHEW: 324.0000 mg | CHEWABLE_TABLET | Freq: Once | ORAL | Status: DC

## 2019-06-01 MED ORDER — RIVAROXABAN 20 MG PO TABS
20.0000 mg | ORAL_TABLET | Freq: Every day | ORAL | Status: DC
  Administered 2019-06-02: 20 mg via ORAL
  Filled 2019-06-01: qty 1

## 2019-06-01 MED ORDER — INSULIN ASPART 100 UNIT/ML ~~LOC~~ SOLN: 0.0000 [IU] | Freq: Three times a day (TID) | SUBCUTANEOUS | Status: DC

## 2019-06-01 MED ORDER — MORPHINE SULFATE ER 15 MG PO TBCR
15.0000 mg | EXTENDED_RELEASE_TABLET | Freq: Two times a day (BID) | ORAL | Status: DC
  Administered 2019-06-02: 15 mg via ORAL
  Filled 2019-06-01: qty 1

## 2019-06-01 NOTE — ED Provider Notes (Signed)
Level 5 caveat due to dementia.  Alyssa Haley is a 69 y.o. female, presenting to the ED with chest pain and shortness of breath.  Patient had no complaints upon my assessment of her.   HPI from Evalee Jefferson, PA-C: "Alyssa Haley is a 69 y.o. female with a history of diabetes, GERD, CAD, hypertension and hyperlipidemia and dementia presenting from her local nursing home for evaluation of chest pain and shortness of breath.  She describes shortness of breath that began while at rest today but also endorses having 2 episodes of fleeting chest pain described as midsternal sharp pain with difficulty taking a deep inspiration x2 yesterday, each lasting less than 10 seconds.  Today her pulse ox was found to be in the low 80s on her chronic 2 L nasal cannula prior to a lot of arrival.  This was increased to 5 L and her oxygen level increased appropriately to 100%.  She was given 324 mg of aspirin prior to arrival.  Of note patient is tested weekly for Covid and her last Covid test was negative last week.  She currently denies any symptoms.  Her symptoms occurred at rest.  Per nursing records she is not ambulatory at the nursing facility.  Per review of chart, patient had an admission at [facility] January 2018 at which time she had a STEMI with heart cath and PTCI stenting.  Additionally she has a documented DVT from January 2020.  She is on Eliquis."    Physical Exam  BP (!) 157/83   Pulse 62   Temp 98.3 F (36.8 C) (Oral)   Resp 12   SpO2 100%   Physical Exam Vitals and nursing note reviewed.  Constitutional:      General: She is not in acute distress.    Appearance: She is well-developed. She is not diaphoretic.  HENT:     Head: Normocephalic and atraumatic.     Mouth/Throat:     Mouth: Mucous membranes are moist.     Pharynx: Oropharynx is clear.  Eyes:     Conjunctiva/sclera: Conjunctivae normal.  Cardiovascular:     Rate and Rhythm: Normal rate  and regular rhythm.     Pulses: Normal pulses.          Radial pulses are 2+ on the right side and 2+ on the left side.       Posterior tibial pulses are 2+ on the right side and 2+ on the left side.     Heart sounds: Normal heart sounds.     Comments: Tactile temperature in the extremities appropriate and equal bilaterally. Pulmonary:     Effort: Pulmonary effort is normal. No respiratory distress.     Breath sounds: Normal breath sounds.     Comments: No increased work of breathing.  Speaks in full sentences without difficulty. SPO2 maintained at 100% on patient's baseline 2 L supplemental O2. Abdominal:     Palpations: Abdomen is soft.     Tenderness: There is no abdominal tenderness. There is no guarding.  Musculoskeletal:     Cervical back: Neck supple.     Right lower leg: No edema.     Left lower leg: No edema.  Lymphadenopathy:     Cervical: No cervical adenopathy.  Skin:    General: Skin is warm and dry.  Neurological:     Mental Status: She is alert.  Psychiatric:        Mood and Affect: Mood and affect normal.  Speech: Speech normal.        Behavior: Behavior normal.     ED Course/Procedures     Procedures   Abnormal Labs Reviewed  BASIC METABOLIC PANEL - Abnormal; Notable for the following components:      Result Value   Glucose, Bld 126 (*)    All other components within normal limits  CBC - Abnormal; Notable for the following components:   RDW 16.9 (*)    All other components within normal limits  CBG MONITORING, ED - Abnormal; Notable for the following components:   Glucose-Capillary 101 (*)    All other components within normal limits   DG Chest Portable 1 View  Result Date: 06/01/2019 CLINICAL DATA:  Left-sided chest pain for 2 weeks. EXAM: PORTABLE CHEST 1 VIEW COMPARISON:  07/17/2018 FINDINGS: The heart size and mediastinal contours are within normal limits. Both lungs are clear except for a tiny area of linear atelectasis at the right base  laterally. No effusions. The visualized skeletal structures are unremarkable. Aortic atherosclerosis. IMPRESSION: No significant acute abnormalities. Aortic Atherosclerosis (ICD10-I70.0). Electronically Signed   By: Francene Boyers M.D.   On: 06/01/2019 14:32     EKG Interpretation  Date/Time:  Monday June 01 2019 12:54:52 EST Ventricular Rate:  61 PR Interval:    QRS Duration: 77 QT Interval:  432 QTC Calculation: 436 R Axis:   68 Text Interpretation: Sinus rhythm When compared to prior, no significant changes seen. No STEMI Confirmed by Theda Belfast ([id number]) on 06/01/2019 3:44:40 PM       MDM   Clinical Course as of May 31 2338  Mon Jun 01, 2019  1759 Attempted to call [facility] for more information. On hold for about 10 minutes without response.   [SJ]  1812 Spoke with patient's daughter, [name].  She states the patient called her and said her blood pressure was too high and that she was having some on and off chest discomfort.    [SJ]    Clinical Course User Index [SJ] Anselm Pancoast, PA-C   Patient care handoff report received from Burgess Amor, PA-C. Plan: Patient's work-up is still pending.  Patient presents with reported chest discomfort and shortness of breath.  She had no such complaints while under my care and through multiple assessments. Patient is nontoxic appearing, afebrile, not tachycardic, not tachypneic, not hypotensive, maintains excellent SPO2 on room air, and is in no apparent distress.  Delta troponins negative.  No acute abnormalities on EKG.  No acute abnormalities on chest x-ray. Lab results overall reassuring.   Vitals:   06/01/19 1330 06/01/19 1400 06/01/19 1430 06/01/19 1457  BP: (!) 146/80 (!) 152/86 (!) 157/83   Pulse: (!) 59 63 63 62  Resp: 13 12 12 12   Temp:      TempSrc:      SpO2: 100% 100% 100% 100%      , PA-C 06/01/19 2348    06/03/19, MD 06/03/19 1131

## 2019-06-01 NOTE — ED Triage Notes (Signed)
Per EMS: pt from [facility] with c/o [facility] that began today and CP that began yesterday.  Pt SP02 low 80's on 2L [place] (baseline).  EMS placed pt on 5L [place] - SP02 100%.  Staff at facility administered 324 ASA prior to EMS arrival.  Pt tested for Covid weekly - Pt negative last week.

## 2019-06-01 NOTE — ED Provider Notes (Signed)
[facility] EMERGENCY DEPARTMENT Provider Note   CSN: [id number] Arrival date & time: 06/01/19  1244     History Chief Complaint  Patient presents with  . Shortness of Breath  . Chest Pain    Alyssa Haley is a 69 y.o. female with a history of diabetes, GERD, CAD, hypertension and hyperlipidemia and dementia presenting from her local nursing home for evaluation of chest pain and shortness of breath.  She describes shortness of breath that began while at rest today but also endorses having 2 episodes of fleeting chest pain described as midsternal sharp pain with difficulty taking a deep inspiration x2 yesterday, each lasting less than 10 seconds.  Today her pulse ox was found to be in the low 80s on her chronic 2 L nasal cannula prior to a lot of arrival.  This was increased to 5 L and her oxygen level increased appropriately to 100%.  She was given 324 mg of aspirin prior to arrival.  Of note patient is tested weekly for Covid and her last Covid test was negative last week.  She currently denies any symptoms.  Her symptoms occurred at rest.  Per nursing records she is not ambulatory at the nursing facility.  Per review of chart, patient had an admission at [facility] January 2018 at which time she had a STEMI with heart cath and PTCI stenting.  Additionally she has a documented DVT from January 2020.  She is on Eliquis.     The history is provided by the patient and the nursing home.       Past Medical History:  Diagnosis Date  . Coronary artery disease   . Diabetes mellitus without complication ([place])   . GERD (gastroesophageal reflux disease)   . Gout   . Hyperlipidemia   . Hypertension   . Sepsis ([place]) 05/28/2018  . Sleep apnea   . UTI (urinary tract infection) 05/2018    Patient Active Problem List   Diagnosis Date Noted  . Sepsis due to urinary tract infection ([place]) 07/07/2018  . GERD (gastroesophageal reflux  disease) 07/07/2018  . Hyperlipidemia 07/07/2018  . Pressure injury of skin 07/07/2018  . Sepsis ([place]) 05/28/2018  . CAD (coronary artery disease) 05/28/2018  . Essential hypertension 05/28/2018  . Uncontrolled type 2 diabetes mellitus with hyperglycemia ([place]) 05/28/2018  . Gout 05/28/2018  . Acute lower UTI 05/28/2018  . Swelling of joint of left knee 05/28/2018  . Sepsis secondary to UTI ([place]) 05/28/2018    Past Surgical History:  Procedure Laterality Date  . CARDIAC CATHETERIZATION    . CORONARY ANGIOPLASTY    . CORONARY STENT PLACEMENT    . REPLACEMENT TOTAL KNEE       OB History    Gravida      Para      Term      Preterm      AB      Living  2     SAB      TAB      Ectopic      Multiple      Live Births              Family History  Problem Relation Age of Onset  . Diabetes Mellitus II Neg Hx     Social History   Tobacco Use  . Smoking status: Former Research scientist (life sciences)  . Smokeless tobacco: Never Used  Substance Use Topics  . Alcohol use: Not Currently  .  Drug use: Never    Home Medications Prior to Admission medications   Medication Sig Start Date End Date Taking? Authorizing Provider  acetaminophen (TYLENOL) 500 MG tablet Take 1,000 mg by mouth every 6 (six) hours as needed for mild pain or headache.    [provider]  allopurinol (ZYLOPRIM) 100 MG tablet Take 100 mg by mouth 2 (two) times daily.    [provider]  apixaban (ELIQUIS) 5 MG TABS tablet Take 1 tablet (5 mg total) by mouth 2 (two) times daily. To be started 06/04/2018. Patient taking differently: Take 5 mg by mouth 2 (two) times daily.  06/04/18   Hongalgi, Maximino Greenland, MD  atorvastatin (LIPITOR) 80 MG tablet Take 80 mg by mouth daily. 12/22/17   [provider]  feeding supplement, GLUCERNA SHAKE, (GLUCERNA SHAKE) LIQD Take 237 mLs by mouth 3 (three) times daily between meals. Patient not taking: Reported on 07/07/2018 06/02/18   Elease Etienne, MD  gabapentin  (NEURONTIN) 300 MG capsule Take 1 capsule (300 mg total) by mouth 2 (two) times daily. 06/02/18   Hongalgi, Maximino Greenland, MD  insulin aspart (NOVOLOG) 100 UNIT/ML injection Inject 0-9 Units into the skin 3 (three) times daily with meals. CBG < 70: implement hypoglycemia protocol CBG 70 - 120: 0 units CBG 121 - 150: 1 unit CBG 151 - 200: 2 units CBG 201 - 250: 3 units CBG 251 - 300: 5 units CBG 301 - 350: 7 units CBG 351 - 400: 9 units CBG > 400: call MD. Patient not taking: Reported on 07/07/2018 06/02/18   Elease Etienne, MD  Insulin Glargine, 1 Unit Dial, (TOUJEO SOLOSTAR) 300 UNIT/ML SOPN Inject 35 Units into the skin daily. Patient taking differently: Inject 30 Units into the skin daily.  06/03/18   Hongalgi, Maximino Greenland, MD  metoprolol succinate (TOPROL-XL) 50 MG 24 hr tablet Take 50 mg by mouth daily. 12/22/17   [provider]  mirtazapine (REMERON) 30 MG tablet Take 30 mg by mouth at bedtime.    [provider]  Multiple Vitamin (MULTIVITAMIN WITH MINERALS) TABS tablet Take 1 tablet by mouth daily. Patient not taking: Reported on 07/18/2018 06/03/18   Elease Etienne, MD  nystatin (MYCOSTATIN) 100000 UNIT/ML suspension Take 5 mLs (500,000 Units total) by mouth 4 (four) times daily. 07/22/18   Pokhrel, Rebekah Chesterfield, MD  omeprazole (PRILOSEC) 40 MG capsule Take 40 mg by mouth daily.    [provider]  ondansetron (ZOFRAN) 4 MG tablet Take 1 tablet (4 mg total) by mouth every 6 (six) hours as needed for nausea. 07/22/18   Pokhrel, Rebekah Chesterfield, MD  oxyCODONE-acetaminophen (PERCOCET) 10-325 MG tablet Take 1 tablet by mouth every 6 (six) hours as needed for pain (Moderate - Severe pain.). 07/22/18   Pokhrel, Rebekah Chesterfield, MD  polyethylene glycol (MIRALAX) packet Take 17 g by mouth daily. Patient not taking: Reported on 07/07/2018 06/02/18   Elease Etienne, MD  potassium chloride SA (K-DUR,KLOR-CON) 20 MEQ tablet Take 1 tablet (20 mEq total) by mouth daily for 5 days. 07/23/18 07/28/18  Pokhrel,  Laxman, MD  Semaglutide (OZEMPIC, 0.25 OR 0.5 MG/DOSE, [place]) Inject 0.25 mg into the skin once a week.    [provider]    Allergies    Penicillins  Review of Systems   Review of Systems  Unable to perform ROS: Dementia (ROS of questionable accuracy given dementia)  Constitutional: Negative for chills and fever.  HENT: Negative.   Eyes: Negative.   Respiratory: Positive for  shortness of breath. Negative for cough and chest tightness.   Cardiovascular: Positive for chest pain. Negative for palpitations and leg swelling.  Gastrointestinal: Negative for abdominal pain, nausea and vomiting.  Genitourinary: Negative.   Skin: Negative.  Negative for rash and wound.  Neurological: Negative for dizziness and weakness.  Psychiatric/Behavioral: Negative.     Physical Exam Updated Vital Signs BP (!) 152/86   Pulse 63   Temp 98.3 F (36.8 C) (Oral)   Resp 12   SpO2 100%   Physical Exam Vitals and nursing note reviewed.  Constitutional:      General: She is not in acute distress.    Appearance: She is well-developed.  HENT:     Head: Normocephalic and atraumatic.  Eyes:     Conjunctiva/sclera: Conjunctivae normal.  Cardiovascular:     Rate and Rhythm: Normal rate and regular rhythm.     Heart sounds: Normal heart sounds.  Pulmonary:     Effort: Pulmonary effort is normal.     Breath sounds: Normal breath sounds. No decreased breath sounds, wheezing or rhonchi.  Abdominal:     General: Bowel sounds are normal.     Palpations: Abdomen is soft.     Tenderness: There is no abdominal tenderness.  Musculoskeletal:     Cervical back: Normal range of motion.     Right lower leg: No tenderness.     Left lower leg: No tenderness.     Comments: No unilateral leg edema. Bilateral ankle edema.   Skin:    General: Skin is warm and dry.  Neurological:     Mental Status: She is alert.     ED Results / Procedures / Treatments   Labs (all labs ordered are listed, but only  abnormal results are displayed) Labs Reviewed  CBC - Abnormal; Notable for the following components:      Result Value   RDW 16.9 (*)    All other components within normal limits  CBG MONITORING, ED - Abnormal; Notable for the following components:   Glucose-Capillary 101 (*)    All other components within normal limits  BASIC METABOLIC PANEL  POC SARS CORONAVIRUS 2 AG -  ED  TROPONIN I (HIGH SENSITIVITY)  TROPONIN I (HIGH SENSITIVITY)    EKG EKG Interpretation  Date/Time:  Monday June 01 2019 12:54:52 EST Ventricular Rate:  61 PR Interval:    QRS Duration: 77 QT Interval:  432 QTC Calculation: 436 R Axis:   68 Text Interpretation: Sinus rhythm When compared to prior, no significant changes seen. No STEMI Confirmed by Theda Belfast ([id number]) on 06/01/2019 3:44:40 PM   Radiology DG Chest Portable 1 View  Result Date: 06/01/2019 CLINICAL DATA:  Left-sided chest pain for 2 weeks. EXAM: PORTABLE CHEST 1 VIEW COMPARISON:  07/17/2018 FINDINGS: The heart size and mediastinal contours are within normal limits. Both lungs are clear except for a tiny area of linear atelectasis at the right base laterally. No effusions. The visualized skeletal structures are unremarkable. Aortic atherosclerosis. IMPRESSION: No significant acute abnormalities. Aortic Atherosclerosis (ICD10-I70.0). Electronically Signed   By: Francene Boyers M.D.   On: 06/01/2019 14:32    Procedures Procedures (including critical care time)  Medications Ordered in ED Medications - No data to display  ED Course  I have reviewed the triage vital signs and the nursing notes.  Pertinent labs & imaging results that were available during my care of the patient were reviewed by me and considered in my medical decision making (see chart for details).  MDM Rules/Calculators/A&P                      Pt with h/o CAD with prior stemi and stenting, also h/o dvt on Eliquis.  Pending labs.  Sx free here.   Pt discussed with  Harolyn Rutherford, PA-C who assumes care.    Final Clinical Impression(s) / ED Diagnoses Final diagnoses:  None    Rx / DC Orders ED Discharge Orders    None       Victoriano Lain 06/01/19 1611    Tegeler, Canary Brim, MD 06/03/19 1630

## 2019-06-01 NOTE — Discharge Instructions (Signed)
The work-up today was reassuring.  Follow-up with a primary care provider for any further management of these complaints. Return to the emergency department for chest pain, dizziness, shortness of breath, persistent vomiting, or any other major concerns.

## 2019-06-01 NOTE — ED Notes (Signed)
PTAR Called going to [facility]

## 2019-06-02 DIAGNOSIS — R0789 Other chest pain: Secondary | ICD-10-CM | POA: Diagnosis not present

## 2019-11-05 ENCOUNTER — Other Ambulatory Visit: Payer: Self-pay

## 2019-11-05 ENCOUNTER — Inpatient Hospital Stay (HOSPITAL_COMMUNITY)
Admission: EM | Admit: 2019-11-05 | Discharge: 2019-11-12 | DRG: 871 | Disposition: A | Discharge status: Hospice | Payer: Medicare Other | Source: Skilled Nursing Facility | Attending: Family Medicine | Admitting: Family Medicine

## 2019-11-05 ENCOUNTER — Emergency Department (HOSPITAL_COMMUNITY): Payer: Medicare Other

## 2019-11-05 ENCOUNTER — Encounter (HOSPITAL_COMMUNITY): Payer: Self-pay

## 2019-11-05 DIAGNOSIS — F0280 Dementia in other diseases classified elsewhere without behavioral disturbance: Secondary | ICD-10-CM | POA: Diagnosis present

## 2019-11-05 DIAGNOSIS — Z96659 Presence of unspecified artificial knee joint: Secondary | ICD-10-CM | POA: Diagnosis present

## 2019-11-05 DIAGNOSIS — Z66 Do not resuscitate: Secondary | ICD-10-CM | POA: Diagnosis not present

## 2019-11-05 DIAGNOSIS — A419 Sepsis, unspecified organism: Principal | ICD-10-CM | POA: Diagnosis present

## 2019-11-05 DIAGNOSIS — I10 Essential (primary) hypertension: Secondary | ICD-10-CM | POA: Diagnosis present

## 2019-11-05 DIAGNOSIS — Z7189 Other specified counseling: Secondary | ICD-10-CM | POA: Diagnosis not present

## 2019-11-05 DIAGNOSIS — G9341 Metabolic encephalopathy: Secondary | ICD-10-CM | POA: Diagnosis present

## 2019-11-05 DIAGNOSIS — L89890 Pressure ulcer of other site, unstageable: Secondary | ICD-10-CM | POA: Diagnosis present

## 2019-11-05 DIAGNOSIS — R7401 Elevation of levels of liver transaminase levels: Secondary | ICD-10-CM | POA: Diagnosis not present

## 2019-11-05 DIAGNOSIS — G894 Chronic pain syndrome: Secondary | ICD-10-CM | POA: Diagnosis present

## 2019-11-05 DIAGNOSIS — Z79899 Other long term (current) drug therapy: Secondary | ICD-10-CM

## 2019-11-05 DIAGNOSIS — Z86718 Personal history of other venous thrombosis and embolism: Secondary | ICD-10-CM

## 2019-11-05 DIAGNOSIS — M109 Gout, unspecified: Secondary | ICD-10-CM | POA: Diagnosis present

## 2019-11-05 DIAGNOSIS — Z7401 Bed confinement status: Secondary | ICD-10-CM

## 2019-11-05 DIAGNOSIS — Z87891 Personal history of nicotine dependence: Secondary | ICD-10-CM

## 2019-11-05 DIAGNOSIS — D649 Anemia, unspecified: Secondary | ICD-10-CM | POA: Diagnosis present

## 2019-11-05 DIAGNOSIS — S81809A Unspecified open wound, unspecified lower leg, initial encounter: Secondary | ICD-10-CM | POA: Diagnosis not present

## 2019-11-05 DIAGNOSIS — L89610 Pressure ulcer of right heel, unstageable: Secondary | ICD-10-CM | POA: Diagnosis present

## 2019-11-05 DIAGNOSIS — Z515 Encounter for palliative care: Secondary | ICD-10-CM | POA: Diagnosis not present

## 2019-11-05 DIAGNOSIS — E1165 Type 2 diabetes mellitus with hyperglycemia: Secondary | ICD-10-CM | POA: Diagnosis present

## 2019-11-05 DIAGNOSIS — R32 Unspecified urinary incontinence: Secondary | ICD-10-CM | POA: Diagnosis present

## 2019-11-05 DIAGNOSIS — R531 Weakness: Secondary | ICD-10-CM | POA: Diagnosis not present

## 2019-11-05 DIAGNOSIS — R4182 Altered mental status, unspecified: Secondary | ICD-10-CM | POA: Diagnosis not present

## 2019-11-05 DIAGNOSIS — I959 Hypotension, unspecified: Secondary | ICD-10-CM

## 2019-11-05 DIAGNOSIS — L89154 Pressure ulcer of sacral region, stage 4: Secondary | ICD-10-CM | POA: Diagnosis present

## 2019-11-05 DIAGNOSIS — I2510 Atherosclerotic heart disease of native coronary artery without angina pectoris: Secondary | ICD-10-CM | POA: Diagnosis present

## 2019-11-05 DIAGNOSIS — R6520 Severe sepsis without septic shock: Secondary | ICD-10-CM | POA: Diagnosis present

## 2019-11-05 DIAGNOSIS — G309 Alzheimer's disease, unspecified: Secondary | ICD-10-CM | POA: Diagnosis present

## 2019-11-05 DIAGNOSIS — Z880 Allergy status to penicillin: Secondary | ICD-10-CM | POA: Diagnosis not present

## 2019-11-05 DIAGNOSIS — Z79891 Long term (current) use of opiate analgesic: Secondary | ICD-10-CM | POA: Diagnosis not present

## 2019-11-05 DIAGNOSIS — M869 Osteomyelitis, unspecified: Secondary | ICD-10-CM

## 2019-11-05 DIAGNOSIS — M8468XA Pathological fracture in other disease, other site, initial encounter for fracture: Secondary | ICD-10-CM | POA: Diagnosis present

## 2019-11-05 DIAGNOSIS — R410 Disorientation, unspecified: Secondary | ICD-10-CM

## 2019-11-05 DIAGNOSIS — L89520 Pressure ulcer of left ankle, unstageable: Secondary | ICD-10-CM | POA: Diagnosis present

## 2019-11-05 DIAGNOSIS — Z20822 Contact with and (suspected) exposure to covid-19: Secondary | ICD-10-CM | POA: Diagnosis present

## 2019-11-05 DIAGNOSIS — L03312 Cellulitis of back [any part except buttock]: Secondary | ICD-10-CM | POA: Diagnosis present

## 2019-11-05 DIAGNOSIS — F0390 Unspecified dementia without behavioral disturbance: Secondary | ICD-10-CM | POA: Diagnosis not present

## 2019-11-05 DIAGNOSIS — Z955 Presence of coronary angioplasty implant and graft: Secondary | ICD-10-CM

## 2019-11-05 DIAGNOSIS — Z794 Long term (current) use of insulin: Secondary | ICD-10-CM

## 2019-11-05 DIAGNOSIS — L89510 Pressure ulcer of right ankle, unstageable: Secondary | ICD-10-CM | POA: Diagnosis present

## 2019-11-05 LAB — CBC WITH DIFFERENTIAL/PLATELET
Abs Immature Granulocytes: 0.33 10*3/uL — ABNORMAL HIGH (ref 0.00–0.07)
Basophils Absolute: 0.1 10*3/uL (ref 0.0–0.1)
Basophils Relative: 0 %
Eosinophils Absolute: 0.1 10*3/uL (ref 0.0–0.5)
Eosinophils Relative: 0 %
HCT: 33.3 % — ABNORMAL LOW (ref 36.0–46.0)
Hemoglobin: 10.4 g/dL — ABNORMAL LOW (ref 12.0–15.0)
Immature Granulocytes: 2 %
Lymphocytes Relative: 10 %
Lymphs Abs: 2.1 10*3/uL (ref 0.7–4.0)
MCH: 26.4 pg (ref 26.0–34.0)
MCHC: 31.2 g/dL (ref 30.0–36.0)
MCV: 84.5 fL (ref 80.0–100.0)
Monocytes Absolute: 1.1 10*3/uL — ABNORMAL HIGH (ref 0.1–1.0)
Monocytes Relative: 6 %
Neutro Abs: 16.3 10*3/uL — ABNORMAL HIGH (ref 1.7–7.7)
Neutrophils Relative %: 82 %
Platelets: 371 10*3/uL (ref 150–400)
RBC: 3.94 MIL/uL (ref 3.87–5.11)
RDW: 16.4 % — ABNORMAL HIGH (ref 11.5–15.5)
WBC: 19.9 10*3/uL — ABNORMAL HIGH (ref 4.0–10.5)
nRBC: 0 % (ref 0.0–0.2)

## 2019-11-05 LAB — COMPREHENSIVE METABOLIC PANEL
ALT: 53 U/L — ABNORMAL HIGH (ref 0–44)
AST: 46 U/L — ABNORMAL HIGH (ref 15–41)
Albumin: 2.2 g/dL — ABNORMAL LOW (ref 3.5–5.0)
Alkaline Phosphatase: 113 U/L (ref 38–126)
Anion gap: 8 (ref 5–15)
BUN: 42 mg/dL — ABNORMAL HIGH (ref 8–23)
CO2: 27 mmol/L (ref 22–32)
Calcium: 8.6 mg/dL — ABNORMAL LOW (ref 8.9–10.3)
Chloride: 105 mmol/L (ref 98–111)
Creatinine, Ser: 0.79 mg/dL (ref 0.44–1.00)
GFR calc Af Amer: >60 mL/min (ref >60)
GFR calc non Af Amer: >60 mL/min (ref >60)
Glucose, Bld: 326 mg/dL — ABNORMAL HIGH (ref 70–99)
Potassium: 4.6 mmol/L (ref 3.5–5.1)
Sodium: 140 mmol/L (ref 135–145)
Total Bilirubin: 0.5 mg/dL (ref 0.3–1.2)
Total Protein: 7.4 g/dL (ref 6.5–8.1)

## 2019-11-05 LAB — PROTIME-INR
INR: 1.1 (ref 0.8–1.2)
Prothrombin Time: 14.1 seconds (ref 11.4–15.2)

## 2019-11-05 LAB — APTT: aPTT: 36 seconds (ref 24–36)

## 2019-11-05 LAB — CBG MONITORING, ED: Glucose-Capillary: 233 mg/dL — ABNORMAL HIGH (ref 70–99)

## 2019-11-05 LAB — SARS CORONAVIRUS 2 BY RT PCR (HOSPITAL ORDER, PERFORMED IN ~~LOC~~ HOSPITAL LAB): SARS Coronavirus 2: NEGATIVE

## 2019-11-05 LAB — LACTIC ACID, PLASMA
Lactic Acid, Venous: 2 mmol/L (ref 0.5–1.9)
Lactic Acid, Venous: 2 mmol/L (ref 0.5–1.9)

## 2019-11-05 LAB — TROPONIN I (HIGH SENSITIVITY)
Troponin I (High Sensitivity): 9 ng/L (ref <18)
Troponin I (High Sensitivity): 11 ng/L (ref <18)

## 2019-11-05 MED ORDER — DOCUSATE SODIUM 100 MG PO CAPS
100.0000 mg | ORAL_CAPSULE | Freq: Every day | ORAL | Status: DC
  Administered 2019-11-05 – 2019-11-11 (×7): 100 mg via ORAL
  Filled 2019-11-05 (×7): qty 1

## 2019-11-05 MED ORDER — OXYCODONE-ACETAMINOPHEN 10-325 MG PO TABS: 1.0000 | ORAL_TABLET | Freq: Four times a day (QID) | ORAL | Status: DC | PRN

## 2019-11-05 MED ORDER — ENOXAPARIN SODIUM 40 MG/0.4ML ~~LOC~~ SOLN
40.0000 mg | SUBCUTANEOUS | Status: DC
  Administered 2019-11-05 – 2019-11-11 (×7): 40 mg via SUBCUTANEOUS
  Filled 2019-11-05 (×7): qty 0.4

## 2019-11-05 MED ORDER — POLYETHYLENE GLYCOL 3350 17 G PO PACK
17.0000 g | PACK | Freq: Every day | ORAL | Status: DC
  Administered 2019-11-06 – 2019-11-12 (×7): 17 g via ORAL
  Filled 2019-11-05 (×7): qty 1

## 2019-11-05 MED ORDER — SODIUM CHLORIDE 0.9 % IV BOLUS
1000.0000 mL | Freq: Once | INTRAVENOUS | Status: AC
  Administered 2019-11-05: 1000 mL via INTRAVENOUS

## 2019-11-05 MED ORDER — ACETAMINOPHEN 325 MG PO TABS
650.0000 mg | ORAL_TABLET | ORAL | Status: DC | PRN
  Administered 2019-11-07 – 2019-11-10 (×5): 650 mg via ORAL
  Filled 2019-11-05 (×5): qty 2

## 2019-11-05 MED ORDER — ATORVASTATIN CALCIUM 40 MG PO TABS
80.0000 mg | ORAL_TABLET | Freq: Every day | ORAL | Status: DC
  Administered 2019-11-05 – 2019-11-11 (×7): 80 mg via ORAL
  Filled 2019-11-05 (×7): qty 2

## 2019-11-05 MED ORDER — PANTOPRAZOLE SODIUM 40 MG PO TBEC
40.0000 mg | DELAYED_RELEASE_TABLET | Freq: Every day | ORAL | Status: DC
  Administered 2019-11-06 – 2019-11-12 (×7): 40 mg via ORAL
  Filled 2019-11-05 (×7): qty 1

## 2019-11-05 MED ORDER — INSULIN GLARGINE 100 UNIT/ML ~~LOC~~ SOLN
20.0000 [IU] | Freq: Every day | SUBCUTANEOUS | Status: DC
  Administered 2019-11-06: 20 [IU] via SUBCUTANEOUS
  Filled 2019-11-05: qty 0.2

## 2019-11-05 MED ORDER — MORPHINE SULFATE ER 30 MG PO TBCR
30.0000 mg | EXTENDED_RELEASE_TABLET | Freq: Every day | ORAL | Status: DC
  Administered 2019-11-06: 30 mg via ORAL
  Filled 2019-11-05: qty 2

## 2019-11-05 MED ORDER — SODIUM CHLORIDE 0.9 % IV SOLN
1.0000 g | INTRAVENOUS | Status: DC
  Administered 2019-11-06 – 2019-11-08 (×3): 1 g via INTRAVENOUS
  Filled 2019-11-05: qty 1
  Filled 2019-11-05: qty 10
  Filled 2019-11-05: qty 1
  Filled 2019-11-05: qty 10

## 2019-11-05 MED ORDER — OXYCODONE HCL 5 MG PO TABS
5.0000 mg | ORAL_TABLET | Freq: Four times a day (QID) | ORAL | Status: DC | PRN
  Administered 2019-11-06 – 2019-11-12 (×19): 5 mg via ORAL
  Filled 2019-11-05 (×20): qty 1

## 2019-11-05 MED ORDER — VANCOMYCIN HCL IN DEXTROSE 1-5 GM/200ML-% IV SOLN
1000.0000 mg | Freq: Once | INTRAVENOUS | Status: DC
  Filled 2019-11-05: qty 200

## 2019-11-05 MED ORDER — SERTRALINE HCL 50 MG PO TABS
50.0000 mg | ORAL_TABLET | Freq: Every day | ORAL | Status: DC
  Administered 2019-11-06 – 2019-11-12 (×7): 50 mg via ORAL
  Filled 2019-11-05 (×7): qty 1

## 2019-11-05 MED ORDER — IOHEXOL 300 MG/ML  SOLN
100.0000 mL | Freq: Once | INTRAMUSCULAR | Status: AC | PRN
  Administered 2019-11-05: 100 mL via INTRAVENOUS

## 2019-11-05 MED ORDER — OXYCODONE-ACETAMINOPHEN 5-325 MG PO TABS
1.0000 | ORAL_TABLET | Freq: Four times a day (QID) | ORAL | Status: DC | PRN
  Administered 2019-11-06 – 2019-11-12 (×21): 1 via ORAL
  Filled 2019-11-05 (×21): qty 1

## 2019-11-05 MED ORDER — SODIUM CHLORIDE 0.9 % IV SOLN: 2.0000 g | Freq: Once | INTRAVENOUS | Status: DC

## 2019-11-05 MED ORDER — VANCOMYCIN HCL IN DEXTROSE 1-5 GM/200ML-% IV SOLN
1000.0000 mg | INTRAVENOUS | Status: AC
  Administered 2019-11-05 (×2): 1000 mg via INTRAVENOUS
  Filled 2019-11-05: qty 200

## 2019-11-05 MED ORDER — VANCOMYCIN HCL 2000 MG/400ML IV SOLN
2000.0000 mg | Freq: Once | INTRAVENOUS | Status: DC
  Filled 2019-11-05: qty 400

## 2019-11-05 MED ORDER — INSULIN ASPART 100 UNIT/ML ~~LOC~~ SOLN
0.0000 [IU] | Freq: Every day | SUBCUTANEOUS | Status: DC
  Administered 2019-11-05 – 2019-11-11 (×2): 2 [IU] via SUBCUTANEOUS
  Filled 2019-11-05: qty 0.05

## 2019-11-05 MED ORDER — SODIUM CHLORIDE (PF) 0.9 % IJ SOLN
INTRAMUSCULAR | Status: AC
  Filled 2019-11-05: qty 50

## 2019-11-05 MED ORDER — MORPHINE SULFATE ER 30 MG PO CP24: 30.0000 mg | ORAL_CAPSULE | Freq: Every morning | ORAL | Status: DC

## 2019-11-05 MED ORDER — VANCOMYCIN HCL IN DEXTROSE 1-5 GM/200ML-% IV SOLN
1000.0000 mg | Freq: Two times a day (BID) | INTRAVENOUS | Status: DC
  Administered 2019-11-06 – 2019-11-08 (×5): 1000 mg via INTRAVENOUS
  Filled 2019-11-05 (×6): qty 200

## 2019-11-05 MED ORDER — MORPHINE SULFATE ER 15 MG PO TBCR
15.0000 mg | EXTENDED_RELEASE_TABLET | Freq: Every day | ORAL | Status: DC
  Administered 2019-11-05: 15 mg via ORAL
  Filled 2019-11-05: qty 1

## 2019-11-05 MED ORDER — MELATONIN 3 MG PO TABS
3.0000 mg | ORAL_TABLET | Freq: Every day | ORAL | Status: DC
  Administered 2019-11-05 – 2019-11-11 (×7): 3 mg via ORAL
  Filled 2019-11-05 (×7): qty 1

## 2019-11-05 MED ORDER — SODIUM CHLORIDE 0.9 % IV SOLN
2.0000 g | Freq: Once | INTRAVENOUS | Status: AC
  Administered 2019-11-05: 2 g via INTRAVENOUS
  Filled 2019-11-05: qty 20

## 2019-11-05 MED ORDER — INSULIN ASPART 100 UNIT/ML ~~LOC~~ SOLN
0.0000 [IU] | Freq: Three times a day (TID) | SUBCUTANEOUS | Status: DC
  Administered 2019-11-06: 2 [IU] via SUBCUTANEOUS
  Administered 2019-11-06 (×2): 3 [IU] via SUBCUTANEOUS
  Administered 2019-11-07 (×2): 1 [IU] via SUBCUTANEOUS
  Administered 2019-11-08: 2 [IU] via SUBCUTANEOUS
  Administered 2019-11-08: 3 [IU] via SUBCUTANEOUS
  Administered 2019-11-08 – 2019-11-10 (×5): 2 [IU] via SUBCUTANEOUS
  Administered 2019-11-10: 3 [IU] via SUBCUTANEOUS
  Administered 2019-11-11: 2 [IU] via SUBCUTANEOUS
  Administered 2019-11-12: 1 [IU] via SUBCUTANEOUS
  Administered 2019-11-12: 5 [IU] via SUBCUTANEOUS
  Filled 2019-11-05: qty 0.09

## 2019-11-05 MED ORDER — ALLOPURINOL 100 MG PO TABS
100.0000 mg | ORAL_TABLET | Freq: Two times a day (BID) | ORAL | Status: DC
  Administered 2019-11-05 – 2019-11-12 (×14): 100 mg via ORAL
  Filled 2019-11-05 (×14): qty 1

## 2019-11-05 MED ORDER — FENTANYL CITRATE (PF) 100 MCG/2ML IJ SOLN
25.0000 ug | Freq: Once | INTRAMUSCULAR | Status: AC
  Administered 2019-11-05: 25 ug via INTRAVENOUS
  Filled 2019-11-05: qty 2

## 2019-11-05 NOTE — ED Triage Notes (Signed)
Pt BIB EMS from [facility]. Staff called EMS due to skin infection all over her legs, bottom, right shoulder, neck, and cheek. Staff reports decrease in mental status and mobility over the last 2 weeks. Hx of dementia but usually A&O x4. Is now A&O x1.  HR 118 Temp 95.2 BP 106/60 RR 10

## 2019-11-05 NOTE — ED Notes (Addendum)
x2 unsuccessful IV attempts. No available RN to attempt IV at this time.

## 2019-11-05 NOTE — Progress Notes (Signed)
A consult was received from an ED physician for Aztreonam per pharmacy dosing.  The patient's profile has been reviewed for ht/wt/allergies/indication/available labs.   PCN allergy noted.  Patient has tolerated multiple courses of cephalosporins in the past. A one time order has been placed for Rocephin 2gm.  Further antibiotics/pharmacy consults should be ordered by admitting physician if indicated.                       Thank you, Junita Push PharmD 11/05/2019  6:39 PM

## 2019-11-05 NOTE — ED Provider Notes (Signed)
Care handoff received from [facility] PA-C at shift change, please see previous providers note for full details of visit. In short patient with diabetes, hyperlipidemia, sepsis, gout, hypertension presents to the ER for altered mental status and concern of sepsis.  Patient has decreased activity over the past 2-3 weeks.  There is pressure ulcers present which we are concerned for infection, no recent antibiotics.  Low-grade fever at the nursing home.  Patient with deep sacral wound on examination, additionally has superficial wounds on ankles.  Blood pressure is soft on arrival mildly tachycardic rectal temperature shows no fever.  She was given a 1 L fluid bolus.  Labs and chest x-ray were ordered in addition to CT head and CT abdomen pelvis.  Plain x-rays of the tibia-fibula were without evidence of osteomyelitis.  At shift change labs began to result which showed a significant leukocytosis of 19.9, given patient now meets sepsis criteria empiric antibiotics were ordered for coverage of suspected skin infection due to ulcerations, additional fluids were ordered and code sepsis was initiated.  Physical Exam  BP 113/71   Pulse 99   Temp 99.3 F (37.4 C) (Oral)   Resp 15   SpO2 99%   Physical Exam Constitutional:      General: She is not in acute distress.    Appearance: She is well-developed. She is not diaphoretic.  HENT:     Head: Normocephalic and atraumatic.  Eyes:     General: Vision grossly intact. Gaze aligned appropriately.     Pupils: Pupils are equal, round, and reactive to light.  Neck:     Trachea: Trachea and phonation normal.  Pulmonary:     Effort: Pulmonary effort is normal. No respiratory distress.  Abdominal:     General: There is no distension.     Palpations: Abdomen is soft.     Tenderness: There is no abdominal tenderness. There is no guarding or rebound.  Musculoskeletal:        General: Normal range of motion.     Cervical back: Normal range of motion.   Skin:    General: Skin is warm and dry.  Neurological:     Mental Status: She is alert.     GCS: GCS eye subscore is 4. GCS verbal subscore is 5. GCS motor subscore is 6.     Comments: Alert and oriented x2, follows basic commands  Psychiatric:        Behavior: Behavior normal.    ED Course/Procedures     .Critical Care Performed by: Bill Salinas, PA-C Authorized by: Bill Salinas, PA-C   Critical care provider statement:    Critical care time (minutes):  40   Critical care was necessary to treat or prevent imminent or life-threatening deterioration of the following conditions:  Sepsis   Critical care was time spent personally by me on the following activities:  Discussions with consultants, evaluation of patient's response to treatment, examination of patient, ordering and performing treatments and interventions, ordering and review of laboratory studies, ordering and review of radiographic studies, pulse oximetry, re-evaluation of patient's condition, obtaining history from patient or surrogate, review of old charts and development of treatment plan with patient or surrogate    MDM  I ordered, reviewed and interpreted labs which include: CBC shows leukocytosis of 19.9 with left shift, mild anemia of 10.4. CMP shows no emergent electrolyte derangement or elevated creatinine, mild elevation of AST and ALT, BUN of 42. Lactic 2.0.  CXR:  IMPRESSION:  No  active disease.  Personally reviewed patient's chest x-ray and agree with radiologist interpretation.  DG Left Tib Fib:  IMPRESSION:  Negative.  I personally reviewed patient's left tib-fib x-ray and agree with radiologist interpretation.  CT Head:  IMPRESSION:  1. No acute intracranial abnormality.  2. Ventriculomegaly is slightly more pronounced in the volume loss  seen elsewhere with additional features which can be seen in the  setting of normal pressure hydrocephalus. Correlate with clinical  exam  findings.  3. Chronic microvascular angiopathy and parenchymal volume loss.  Personally viewed patient's CT head, agree with radiologist rotation above, per my interpretation no obvious intracranial hemorrhage.  CT AP:  IMPRESSION:  1. Skin thickening and ulceration superficial to the right posterior  sacrum with small amount subcutaneous gas compatible with sacral  decubitus ulceration. Base of this ulceration is just superficial to  the sacral bone without organized collection, abscess or CT evidence  of osteomyelitis.  2. Additional mild diffuse skin thickening of the posterior body  wall and gluteal soft tissues. Correlate for features of cellulitis.  3. Irregular area of cortical erosive lucency involving left femoral  neck with some surrounding synovitis, correlate for clinical  symptoms as a septic joint is not excluded.  4. Acute to subacute appearing fracture of the fifth sacral segment.  5. Mild circumferential bladder could reflect a cystitis. Correlate  with urinalysis findings.  6. Aortic Atherosclerosis (ICD10-I70.0).  - Patient reevaluated she is resting comfortably in bed vital signs within normal limits.  I palpated along patient's left hip, no tenderness, lower suspicion for septic joint at this time.  Patient received IV fluids, empiric vancomycin/Rocephin, fentanyl for pain.  Patient was accepted to hospitalist service for further treatment.  Note: Portions of this report may have been transcribed using voice recognition software. Every effort was made to ensure accuracy; however, inadvertent computerized transcription errors may still be present.       Elizabeth Palau 11/05/19 2007    Tilden Fossa, MD 11/05/19 2356

## 2019-11-05 NOTE — Progress Notes (Signed)
PIV consult: Arrived to ED, pt off unit.

## 2019-11-05 NOTE — ED Provider Notes (Signed)
[facility]-EMERGENCY DEPT Provider Note   CSN: [id number] Arrival date & time: 11/05/19  1436     History Chief Complaint  Patient presents with   Wound Infection   Altered Mental Status    Alyssa Haley is a 69 y.o. female.  HPI  LEVEL 5 CAVEAT 2/32 to AMS  69 year old female with a history of DM type II, hyperlipidemia, history of sepsis, CAD, gout, hypertension presents to the ER for altered mental status and questionable sepsis.  Patient presents from [facility] where the patient is a resident.  Spoke with nursing staff, patient has been having a decrease in her demeanor over the last 2 to 3 weeks and increasing pain requirements.  Nursing staff reports having to increase her MS Contin from 15 mg to 30 twice a day.  This however proved to be too sedating for her, and she was decreased to 15 mg in the morning and 30 mg at night.  She has continued to complain of severe pain.  Patient has multiple pressure sores including on her sacrum, left lateral shin.  Per nursing staff, the patient has been followed by Dr. Vedia Coffer with wound care.  She has previously received vancomycin and meropenem for these, but has not been taking any antibiotics recently.  For the last week, she has started to develop a low-grade fever of 99.8, and gradually becoming more confused.  Normally staff reports that she is A&O x3, here in the ED she is ANO x0.  Staff also notes that she used to be able to feed herself, but has not been able to over the last few weeks.  I also spoke with the patient's daughter, who confirmed these changes as well.  She is concerned about her nutrition status.  Nursing staff reports that normally her blood pressures are in the 160s over 80s, on presentation her blood pressure was 99/55.  Sent here for rule out of sepsis and further evaluation of her wounds.  Patient is normally nonambulatory   Past Medical History:  Diagnosis Date   Coronary artery disease     Diabetes mellitus without complication (HCC)    GERD (gastroesophageal reflux disease)    Gout    Hyperlipidemia    Hypertension    Sepsis (HCC) 05/28/2018   Sleep apnea    UTI (urinary tract infection) 05/2018    Patient Active Problem List   Diagnosis Date Noted   Sepsis due to urinary tract infection (HCC) 07/07/2018   GERD (gastroesophageal reflux disease) 07/07/2018   Hyperlipidemia 07/07/2018   Pressure injury of skin 07/07/2018   Sepsis (HCC) 05/28/2018   CAD (coronary artery disease) 05/28/2018   Essential hypertension 05/28/2018   Uncontrolled type 2 diabetes mellitus with hyperglycemia (HCC) 05/28/2018   Gout 05/28/2018   Acute lower UTI 05/28/2018   Swelling of joint of left knee 05/28/2018   Sepsis secondary to UTI (HCC) 05/28/2018    Past Surgical History:  Procedure Laterality Date   CARDIAC CATHETERIZATION     CORONARY ANGIOPLASTY     CORONARY STENT PLACEMENT     REPLACEMENT TOTAL KNEE       OB History    Gravida      Para      Term      Preterm      AB      Living  2     SAB      TAB      Ectopic      Multiple  Live Births              Family History  Problem Relation Age of Onset   Diabetes Mellitus II Neg Hx     Social History   Tobacco Use   Smoking status: Former Smoker   Smokeless tobacco: Never Used  Building services engineer Use: Never used  Substance Use Topics   Alcohol use: Not Currently   Drug use: Never    Home Medications Prior to Admission medications   Medication Sig Start Date End Date Taking? Authorizing Provider  acetaminophen (TYLENOL) 325 MG tablet Take 650 mg by mouth every 4 (four) hours as needed for mild pain.    Yes [provider]  allopurinol (ZYLOPRIM) 100 MG tablet Take 100 mg by mouth 2 (two) times daily.   Yes [provider]  atorvastatin (LIPITOR) 80 MG tablet Take 80 mg by mouth daily. 12/22/17  Yes [provider]  docusate  sodium (COLACE) 100 MG capsule Take 100 mg by mouth at bedtime.   Yes [provider]  feeding supplement, GLUCERNA SHAKE, (GLUCERNA SHAKE) LIQD Take 237 mLs by mouth 3 (three) times daily between meals. Patient taking differently: Take 237 mLs by mouth 2 (two) times daily between meals.  06/02/18  Yes Hongalgi, Maximino Greenland, MD  gabapentin (NEURONTIN) 300 MG capsule Take 1 capsule (300 mg total) by mouth 2 (two) times daily. Patient taking differently: Take 300 mg by mouth 3 (three) times daily.  06/02/18  Yes Hongalgi, Maximino Greenland, MD  Insulin Glargine, 1 Unit Dial, (TOUJEO SOLOSTAR) 300 UNIT/ML SOPN Inject 35 Units into the skin daily. Patient taking differently: Inject 30 Units into the skin daily.  06/03/18  Yes Hongalgi, Maximino Greenland, MD  Melatonin 3 MG TABS Take 1 tablet by mouth at bedtime.   Yes [provider]  metoprolol succinate (TOPROL-XL) 50 MG 24 hr tablet Take 50 mg by mouth daily. 12/22/17  Yes [provider]  metroNIDAZOLE (FLAGYL) 500 MG tablet 500 mg 2 (two) times daily. Apply to Sacrum topically for wound care, Clean area to sacrum with normal saline pt dry pack with dakin's soaked gauzes and crushed flagyl 500 mg cover.   Yes [provider]  morphine (KADIAN) 30 MG 24 hr capsule Take 30 mg by mouth daily.   Yes [provider]  morphine (MS CONTIN) 15 MG 12 hr tablet Take 15 mg by mouth daily.    Yes [provider]  Multiple Vitamin (MULTIVITAMIN WITH MINERALS) TABS tablet Take 1 tablet by mouth daily. 06/03/18  Yes Hongalgi, Maximino Greenland, MD  omeprazole (PRILOSEC) 20 MG capsule Take 20 mg by mouth daily.   Yes [provider]  oxyCODONE-acetaminophen (PERCOCET) 10-325 MG tablet Take 1 tablet by mouth every 6 (six) hours as needed for pain (Moderate - Severe pain.). Patient taking differently: Take 1 tablet by mouth every 4 (four) hours as needed for pain.  07/22/18  Yes Pokhrel, Laxman, MD  Pollen Extracts (PROSTAT PO) Take 30 mLs by  mouth in the morning, at noon, and at bedtime.   Yes [provider]  polyethylene glycol (MIRALAX) packet Take 17 g by mouth daily. 06/02/18  Yes Hongalgi, Maximino Greenland, MD  Semaglutide, 1 MG/DOSE, (OZEMPIC, 1 MG/DOSE,) 2 MG/1.5ML SOPN Inject 1 mg into the skin once a week. Friday   Yes [provider]  sertraline (ZOLOFT) 50 MG tablet Take 50 mg by mouth daily.   Yes [provider]  sodium hypochlorite (DAKIN'S  1/4 STRENGTH) 0.125 % SOLN Apply 1 application topically in the morning and at bedtime. Clean area to sacrum with normal saline pat dry pack with dakin's soaked gauzes and crushed flagyl 500 mg, cover.   Yes [provider]  traZODone (DESYREL) 50 MG tablet Take 50 mg by mouth at bedtime.   Yes [provider]  apixaban (ELIQUIS) 5 MG TABS tablet Take 1 tablet (5 mg total) by mouth 2 (two) times daily. To be started 06/04/2018. Patient not taking: Reported on 06/01/2019 06/04/18   Elease Etienne, MD  insulin aspart (NOVOLOG) 100 UNIT/ML injection Inject 0-9 Units into the skin 3 (three) times daily with meals. CBG < 70: implement hypoglycemia protocol CBG 70 - 120: 0 units CBG 121 - 150: 1 unit CBG 151 - 200: 2 units CBG 201 - 250: 3 units CBG 251 - 300: 5 units CBG 301 - 350: 7 units CBG 351 - 400: 9 units CBG > 400: call MD. Patient not taking: Reported on 11/05/2019 06/02/18   Elease Etienne, MD  nystatin (MYCOSTATIN) 100000 UNIT/ML suspension Take 5 mLs (500,000 Units total) by mouth 4 (four) times daily. Patient not taking: Reported on 06/01/2019 07/22/18   Pokhrel, Rebekah Chesterfield, MD  ondansetron (ZOFRAN) 4 MG tablet Take 1 tablet (4 mg total) by mouth every 6 (six) hours as needed for nausea. Patient not taking: Reported on 06/01/2019 07/22/18   Pokhrel, Rebekah Chesterfield, MD  potassium chloride SA (K-DUR,KLOR-CON) 20 MEQ tablet Take 1 tablet (20 mEq total) by mouth daily for 5 days. Patient not taking: Reported on 11/05/2019 07/23/18 07/28/18  Pokhrel, Rebekah Chesterfield, MD   rivaroxaban (XARELTO) 20 MG TABS tablet Take 20 mg by mouth daily with supper. Patient not taking: Reported on 11/05/2019    [provider]    Allergies    Penicillins  Review of Systems   Review of Systems  Unable to perform ROS: Mental status change    Physical Exam Updated Vital Signs BP 113/71    Pulse 99    Temp 99.3 F (37.4 C) (Oral)    Resp 15    SpO2 99%   Physical Exam Vitals and nursing note reviewed.  Constitutional:      General: She is not in acute distress.    Appearance: She is well-developed. She is not ill-appearing or diaphoretic.  HENT:     Head: Normocephalic and atraumatic.  Eyes:     Conjunctiva/sclera: Conjunctivae normal.  Cardiovascular:     Rate and Rhythm: Normal rate and regular rhythm.     Heart sounds: No murmur heard.   Pulmonary:     Effort: Pulmonary effort is normal. No respiratory distress.     Breath sounds: Normal breath sounds.  Abdominal:     Palpations: Abdomen is soft.     Tenderness: There is no abdominal tenderness.  Musculoskeletal:        General: No swelling or tenderness.     Cervical back: Neck supple.     Right lower leg: No edema.     Left lower leg: No edema.  Skin:    General: Skin is warm and dry.     Capillary Refill: Capillary refill takes less than 2 seconds.     Findings: Erythema and lesion present.     Comments: Multiple open wounds/ulcers, 2 to 3 cm deep ulcer on the sacrum, open ulcer on the left lateral shin.  Please see photos and physical exam  Neurological:     Mental Status: She is  alert. She is disoriented.     Comments: Patient will not follow commands, difficult to assess neuro status.  Alert and moaning            ED Results / Procedures / Treatments   Labs (all labs ordered are listed, but only abnormal results are displayed) Labs Reviewed  CBC WITH DIFFERENTIAL/PLATELET - Abnormal; Notable for the following components:      Result Value   WBC 19.9 (*)    Hemoglobin 10.4  (*)    HCT 33.3 (*)    RDW 16.4 (*)    Neutro Abs 16.3 (*)    Monocytes Absolute 1.1 (*)    Abs Immature Granulocytes 0.33 (*)    All other components within normal limits  COMPREHENSIVE METABOLIC PANEL - Abnormal; Notable for the following components:   Glucose, Bld 326 (*)    BUN 42 (*)    Calcium 8.6 (*)    Albumin 2.2 (*)    AST 46 (*)    ALT 53 (*)    All other components within normal limits  LACTIC ACID, PLASMA - Abnormal; Notable for the following components:   Lactic Acid, Venous 2.0 (*)    All other components within normal limits  CULTURE, BLOOD (ROUTINE X 2)  CULTURE, BLOOD (ROUTINE X 2)  AEROBIC CULTURE (SUPERFICIAL SPECIMEN)  AEROBIC CULTURE (SUPERFICIAL SPECIMEN)  URINALYSIS, ROUTINE W REFLEX MICROSCOPIC  LACTIC ACID, PLASMA  TROPONIN I (HIGH SENSITIVITY)    EKG None  Radiology DG Chest 1 View  Result Date: 11/05/2019 CLINICAL DATA:  Skin infection. EXAM: CHEST  1 VIEW COMPARISON:  June 01, 2019. FINDINGS: The heart size and mediastinal contours are within normal limits. Both lungs are clear. The visualized skeletal structures are unremarkable. IMPRESSION: No active disease. Electronically Signed   By: Lupita RaiderJames  Green Jr M.D.   On: 11/05/2019 17:17   DG Tibia/Fibula Left  Result Date: 11/05/2019 CLINICAL DATA:  Skin infection. EXAM: LEFT TIBIA AND FIBULA - 2 VIEW COMPARISON:  None. FINDINGS: There is no evidence of fracture or other focal bone lesions. Soft tissues are unremarkable. IMPRESSION: Negative. Electronically Signed   By: Lupita RaiderJames  Green Jr M.D.   On: 11/05/2019 17:19    Procedures Procedures (including critical care time)  Medications Ordered in ED Medications  sodium chloride 0.9 % bolus 1,000 mL (has no administration in time range)  vancomycin (VANCOREADY) IVPB 2000 mg/400 mL (has no administration in time range)  iohexol (OMNIPAQUE) 300 MG/ML solution 100 mL (has no administration in time range)    ED Course  I have reviewed the triage  vital signs and the nursing notes.  Pertinent labs & imaging results that were available during my care of the patient were reviewed by me and considered in my medical decision making (see chart for details).    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 On presentation, the patient is alert however is not able to answer questions or follow commands.  She is moaning to even the lightest touch.  Blood pressure on presentation was 99/55, which improved throughout the ED course.  She was initially tachycardic as well on presentation with a heart rate of 103, but this also improved throughout the ED course.  Rectal temperature of 99.3.  Patient was started on a 1000 mL  fluid bolus.  Will initiate septic/altered mental status work-up with basic labs, cultures of the wounds, lactic, blood cultures, UA, CT head and chest x-ray.  Plain films of the left shin also ordered and CT abdomen pelvis with contrast ordered per Dr. Jacqulyn Bath to assess sacral wound.  Plain films of tib-fib without evidence of osteomyelitis.  Chest x-ray without acute abnormalities.  EKG with sinus tach, long QT, reviewed by Dr. Particia Nearing.  CBC with leukocytosis of 19.9, hemoglobin of 10.4, which appears to be decreased from 13.2 five months ago.  CMP without significant electrode abnormalities, glucose of 326, mildly elevated AST/ALT of 46 and 53 respectively.  No previous values to compare this to.  Calcium of 8.6 today which appears to be decreased from 9.35 months ago.  Care signed out to [facility].  He has started her on IV vancomycin.  He will receive the rest of her lab work, CT scans, and consult and dispo according to work-up.  Suspect that the patient will likely require admission.  Final Clinical Impression(s) / ED Diagnoses Final diagnoses:  AMS (altered mental status)    Rx / DC Orders ED Discharge Orders    None        Leone Brand 11/05/19 1804    Maia Plan, MD 11/09/19 1521

## 2019-11-05 NOTE — Progress Notes (Signed)
Pharmacy Antibiotic Note  Alyssa Haley is a 69 y.o. female admitted on 11/05/2019 with cellulitis.  Pharmacy has been consulted for Vancomycin dosing.  Plan: Vancomycin 2gm IV load x1 then 1gm IV q12h to target Vanc trough 10-15 Rocephin 1gm IV q24h per MD Monitor renal function and cx data   Height: 5\' 9"  (175.3 cm) Weight: 95.3 kg (210 lb 1.6 oz) IBW/kg (Calculated) : 66.2  Temp (24hrs), Avg:99.3 F (37.4 C), Min:99.3 F (37.4 C), Max:99.3 F (37.4 C)  Recent Labs  Lab 11/05/19 1717 11/05/19 1900  WBC 19.9*  --   CREATININE 0.79  --   LATICACIDVEN 2.0* 2.0*    Estimated Creatinine Clearance: 81.5 mL/min (by C-G formula based on SCr of 0.79 mg/dL).    Allergies  Allergen Reactions  . Penicillins Hives    Can tolerate cephalosporins    Antimicrobials this admission: 7/1 Vanc >>  7/1 Rocephin >>   Dose adjustments this admission:  Microbiology results: 7/1 BCx:  7/1 Wound Cx (superficial):  Thank you for allowing pharmacy to be a part of this patient's care.  9/1 PharmD, BCPS 11/05/2019 8:34 PM

## 2019-11-05 NOTE — H&P (Signed)
History and Physical    Alyssa Haley WJX:[id number] DOB: 1951/02/28 DOA: 11/05/2019  PCP: Eloisa Northern, MD  Patient coming from: [facility]  I have personally briefly reviewed patient's old medical records in [facility]  Chief Complaint: AMS   HPI: Alyssa Haley is a 69 y.o. female with medical history significant for dementia, CAD, hypertension, DVT previously on Eliquis, insulin-dependent type 2 diabetes, and hyperlipidemia who presented from nursing facility for concerns of altered mental status.  patient is a poor historian alert only to self with no family at bedside.  History obtained in its entirety through ED documentation.  Reportedly nursing staff at [facility] health facility noticed changes in patient's mental status and also increased pain requirement.  Patient has a sacral ulcer wound as well as ulcers on her left lateral pretibial region and right lateral heel.  She is followed by wound care outpatient.  Reportedly previously on vancomycin and meropenem but not currently on these.  Patient also noted to have decreased p.o. intake and has not been able to feed herself in the past week.  In the ED, she had temp of 99.3, tachycardic and initially hypotensive down to 99 over 50s with improvement following 2 L of IV normal saline bolus.  She has significant leukocytosis of 19.9.  Lactate of 2.  Worsening hemoglobin of 10.4 from a prior of 13.2 five months ago. Glucose of 326, elevated AST of 46 and ALT of 53.  CT abdomen showed right posterior sacral decubitus ulcer without abscess or CT evidence of osteomyelitis.  There is concern of cellulitis around the gluteal soft tissue. There is an acute to subacute appearing fracture of the fifth sacral segment.  No acute findings on CT head  Review of Systems:  Unable to fully obtain due to patient's dementia  Past Medical History:  Diagnosis Date  . Coronary artery disease   . Diabetes mellitus without complication  (HCC)   . GERD (gastroesophageal reflux disease)   . Gout   . Hyperlipidemia   . Hypertension   . Sepsis (HCC) 05/28/2018  . Sleep apnea   . UTI (urinary tract infection) 05/2018    Past Surgical History:  Procedure Laterality Date  . CARDIAC CATHETERIZATION    . CORONARY ANGIOPLASTY    . CORONARY STENT PLACEMENT    . REPLACEMENT TOTAL KNEE       reports that she has quit smoking. She has never used smokeless tobacco. She reports previous alcohol use. She reports that she does not use drugs.  Allergies  Allergen Reactions  . Penicillins Hives    Can tolerate cephalosporins    Family History  Problem Relation Age of Onset  . Diabetes Mellitus II Neg Hx      Prior to Admission medications   Medication Sig Start Date End Date Taking? Authorizing Provider  acetaminophen (TYLENOL) 325 MG tablet Take 650 mg by mouth every 4 (four) hours as needed for mild pain.    Yes [provider]  allopurinol (ZYLOPRIM) 100 MG tablet Take 100 mg by mouth 2 (two) times daily.   Yes [provider]  atorvastatin (LIPITOR) 80 MG tablet Take 80 mg by mouth daily. 12/22/17  Yes [provider]  docusate sodium (COLACE) 100 MG capsule Take 100 mg by mouth at bedtime.   Yes [provider]  feeding supplement, GLUCERNA SHAKE, (GLUCERNA SHAKE) LIQD Take 237 mLs by mouth 3 (three) times daily between meals. Patient taking differently: Take 237 mLs by mouth 2 (two)  times daily between meals.  06/02/18  Yes Hongalgi, Maximino Greenland, MD  gabapentin (NEURONTIN) 300 MG capsule Take 1 capsule (300 mg total) by mouth 2 (two) times daily. Patient taking differently: Take 300 mg by mouth 3 (three) times daily.  06/02/18  Yes Hongalgi, Maximino Greenland, MD  Insulin Glargine, 1 Unit Dial, (TOUJEO SOLOSTAR) 300 UNIT/ML SOPN Inject 35 Units into the skin daily. Patient taking differently: Inject 30 Units into the skin daily.  06/03/18  Yes Hongalgi, Maximino Greenland, MD  Melatonin 3 MG TABS Take 1 tablet  by mouth at bedtime.   Yes [provider]  metoprolol succinate (TOPROL-XL) 50 MG 24 hr tablet Take 50 mg by mouth daily. 12/22/17  Yes [provider]  metroNIDAZOLE (FLAGYL) 500 MG tablet 500 mg 2 (two) times daily. Apply to Sacrum topically for wound care, Clean area to sacrum with normal saline pt dry pack with dakin's soaked gauzes and crushed flagyl 500 mg cover.   Yes [provider]  morphine (KADIAN) 30 MG 24 hr capsule Take 30 mg by mouth daily.   Yes [provider]  morphine (MS CONTIN) 15 MG 12 hr tablet Take 15 mg by mouth daily.    Yes [provider]  Multiple Vitamin (MULTIVITAMIN WITH MINERALS) TABS tablet Take 1 tablet by mouth daily. 06/03/18  Yes Hongalgi, Maximino Greenland, MD  omeprazole (PRILOSEC) 20 MG capsule Take 20 mg by mouth daily.   Yes [provider]  oxyCODONE-acetaminophen (PERCOCET) 10-325 MG tablet Take 1 tablet by mouth every 6 (six) hours as needed for pain (Moderate - Severe pain.). Patient taking differently: Take 1 tablet by mouth every 4 (four) hours as needed for pain.  07/22/18  Yes Pokhrel, Laxman, MD  Pollen Extracts (PROSTAT PO) Take 30 mLs by mouth in the morning, at noon, and at bedtime.   Yes [provider]  polyethylene glycol (MIRALAX) packet Take 17 g by mouth daily. 06/02/18  Yes Hongalgi, Maximino Greenland, MD  Semaglutide, 1 MG/DOSE, (OZEMPIC, 1 MG/DOSE,) 2 MG/1.5ML SOPN Inject 1 mg into the skin once a week. Friday   Yes [provider]  sertraline (ZOLOFT) 50 MG tablet Take 50 mg by mouth daily.   Yes [provider]  sodium hypochlorite (DAKIN'S 1/4 STRENGTH) 0.125 % SOLN Apply 1 application topically in the morning and at bedtime. Clean area to sacrum with normal saline pat dry pack with dakin's soaked gauzes and crushed flagyl 500 mg, cover.   Yes [provider]  traZODone (DESYREL) 50 MG tablet Take 50 mg by mouth at bedtime.   Yes [provider]  apixaban  (ELIQUIS) 5 MG TABS tablet Take 1 tablet (5 mg total) by mouth 2 (two) times daily. To be started 06/04/2018. Patient not taking: Reported on 06/01/2019 06/04/18   Elease Etienne, MD  insulin aspart (NOVOLOG) 100 UNIT/ML injection Inject 0-9 Units into the skin 3 (three) times daily with meals. CBG < 70: implement hypoglycemia protocol CBG 70 - 120: 0 units CBG 121 - 150: 1 unit CBG 151 - 200: 2 units CBG 201 - 250: 3 units CBG 251 - 300: 5 units CBG 301 - 350: 7 units CBG 351 - 400: 9 units CBG > 400: call MD. Patient not taking: Reported on 11/05/2019 06/02/18   Elease Etienne, MD  nystatin (MYCOSTATIN) 100000 UNIT/ML suspension Take 5 mLs (500,000 Units total) by mouth 4 (four) times daily. Patient not taking: Reported on 06/01/2019 07/22/18   Joycelyn Das, MD  ondansetron (ZOFRAN) 4 MG tablet Take 1 tablet (4 mg total) by mouth every 6 (six) hours as needed for nausea. Patient not taking: Reported on 06/01/2019 07/22/18   Pokhrel, Rebekah Chesterfield, MD  potassium chloride SA (K-DUR,KLOR-CON) 20 MEQ tablet Take 1 tablet (20 mEq total) by mouth daily for 5 days. Patient not taking: Reported on 11/05/2019 07/23/18 07/28/18  Pokhrel, Rebekah Chesterfield, MD  rivaroxaban (XARELTO) 20 MG TABS tablet Take 20 mg by mouth daily with supper. Patient not taking: Reported on 11/05/2019    [provider]    Physical Exam: Vitals:   11/05/19 1530 11/05/19 1850 11/05/19 1910 11/05/19 1958  BP: 113/71 (!) 166/88  (!) 151/97  Pulse: 99 (!) 113  (!) 109  Resp: 15 17  16   Temp:      TempSrc:      SpO2: 99% 100%  100%  Weight:   95.3 kg   Height:   5\' 9"  (1.753 m)     Constitutional: NAD, calm, comfortable, nontoxic appearing elderly female lying flat in bed Vitals:   11/05/19 1530 11/05/19 1850 11/05/19 1910 11/05/19 1958  BP: 113/71 (!) 166/88  (!) 151/97  Pulse: 99 (!) 113  (!) 109  Resp: 15 17  16   Temp:      TempSrc:      SpO2: 99% 100%  100%  Weight:   95.3 kg   Height:   5\' 9"  (1.753 m)    Eyes:  PERRL, lids and conjunctivae normal ENMT: Dry mucous membranes with poor dentition.  Hirsutism noted around chin. Neck: normal, supple Respiratory: clear to auscultation bilaterally, no wheezing, no crackles. Normal respiratory effort. Cardiovascular: Regular rate and rhythm, no murmurs / rubs / gallops. No extremity edema. 2+ pedal pulses.  Abdomen: no tenderness, no masses palpated.  Bowel sounds positive.  GU:.  Catheter in place Musculoskeletal: no clubbing / cyanosis. No joint deformity upper and lower extremities.no contractures. Normal muscle tone.  Skin: Deep stage IV right-sided decubitus ulcer wound with surrounding erythema, right lateral malleolus healing ulcer with eschar, left lateral pretibial lower extremity wound.  Had significant discomfort and complaint of pain during sacral exam.          Neurologic: Patient alert and oriented only to self.  Able to follow simple commands.  Able to lift bilateral lower extremity on her own but did not tolerate strength testing.  Psychiatric: Alert and oriented to self.    Labs on Admission: I have personally reviewed following labs and imaging studies  CBC: Recent Labs  Lab 11/05/19 1717  WBC 19.9*  NEUTROABS 16.3*  HGB 10.4*  HCT 33.3*  MCV 84.5  PLT 371   Basic Metabolic Panel: Recent Labs  Lab 11/05/19 1717  NA 140  K 4.6  CL 105  CO2 27  GLUCOSE 326*  BUN 42*  CREATININE 0.79  CALCIUM 8.6*   GFR: Estimated Creatinine Clearance: 81.5 mL/min (by C-G formula based on SCr of 0.79 mg/dL). Liver Function Tests: Recent Labs  Lab 11/05/19 1717  AST 46*  ALT 53*  ALKPHOS 113  BILITOT 0.5  PROT 7.4  ALBUMIN 2.2*   No results for input(s): LIPASE, AMYLASE in the last 168 hours. No results for input(s): AMMONIA in the last 168 hours. Coagulation Profile: No results for input(s): INR, PROTIME in the last 168 hours. Cardiac Enzymes: No results for input(s): CKTOTAL, CKMB, CKMBINDEX, TROPONINI in the last  168 hours. BNP (last 3 results) No results for input(s): PROBNP in the last 8760 hours.  HbA1C: No results for input(s): HGBA1C in the last 72 hours. CBG: No results for input(s): GLUCAP in the last 168 hours. Lipid Profile: No results for input(s): CHOL, HDL, LDLCALC, TRIG, CHOLHDL, LDLDIRECT in the last 72 hours. Thyroid Function Tests: No results for input(s): TSH, T4TOTAL, FREET4, T3FREE, THYROIDAB in the last 72 hours. Anemia Panel: No results for input(s): VITAMINB12, FOLATE, FERRITIN, TIBC, IRON, RETICCTPCT in the last 72 hours. Urine analysis:    Component Value Date/Time   COLORURINE YELLOW 07/17/2018 1808   APPEARANCEUR TURBID (A) 07/17/2018 1808   LABSPEC 1.015 07/17/2018 1808   PHURINE 5.0 07/17/2018 1808   GLUCOSEU 50 (A) 07/17/2018 1808   HGBUR MODERATE (A) 07/17/2018 1808   BILIRUBINUR NEGATIVE 07/17/2018 1808   KETONESUR NEGATIVE 07/17/2018 1808   PROTEINUR 100 (A) 07/17/2018 1808   NITRITE NEGATIVE 07/17/2018 1808   LEUKOCYTESUR LARGE (A) 07/17/2018 1808    Radiological Exams on Admission: DG Chest 1 View  Result Date: 11/05/2019 CLINICAL DATA:  Skin infection. EXAM: CHEST  1 VIEW COMPARISON:  June 01, 2019. FINDINGS: The heart size and mediastinal contours are within normal limits. Both lungs are clear. The visualized skeletal structures are unremarkable. IMPRESSION: No active disease. Electronically Signed   By: Lupita Raider M.D.   On: 11/05/2019 17:17   DG Tibia/Fibula Left  Result Date: 11/05/2019 CLINICAL DATA:  Skin infection. EXAM: LEFT TIBIA AND FIBULA - 2 VIEW COMPARISON:  None. FINDINGS: There is no evidence of fracture or other focal bone lesions. Soft tissues are unremarkable. IMPRESSION: Negative. Electronically Signed   By: Lupita Raider M.D.   On: 11/05/2019 17:19   CT Head Wo Contrast  Result Date: 11/05/2019 CLINICAL DATA:  Altered mental status, unclear cause EXAM: CT HEAD WITHOUT CONTRAST TECHNIQUE: Contiguous axial images were obtained  from the base of the skull through the vertex without intravenous contrast. COMPARISON:  None. FINDINGS: Brain: No evidence of acute infarction, hemorrhage, hydrocephalus, extra-axial collection or mass lesion/mass effect. Symmetric prominence of the cisterns and sulci compatible with parenchymal volume loss. There is pronounced ventriculomegaly out of proportion to the degree of sulcal dilatation of the lateral and third ventricles with a narrowed callosal septal angle and upward bowing of the tentorium which can be seen in the setting of normal pressure hydrocephalus. Patchy areas of Haley matter hypoattenuation are most compatible with chronic microvascular angiopathy. Vascular: No hyperdense vessel or unexpected calcification. Skull: No calvarial fracture or suspicious osseous lesion. No scalp swelling or hematoma. Sinuses/Orbits: Paranasal sinuses and mastoid air cells are predominantly clear. Included orbital structures are unremarkable. Other: None IMPRESSION: 1. No acute intracranial abnormality. 2. Ventriculomegaly is slightly more pronounced in the volume loss seen elsewhere with additional features which can be seen in the setting of normal pressure hydrocephalus. Correlate with clinical exam findings. 3. Chronic microvascular angiopathy and parenchymal volume loss. Electronically Signed   By: Kreg Shropshire M.D.   On: 11/05/2019 19:00   CT ABDOMEN PELVIS W CONTRAST  Result Date: 11/05/2019 CLINICAL DATA:  Diffuse skin defect shin, decreased mental status and mobility EXAM: CT ABDOMEN AND PELVIS WITH CONTRAST TECHNIQUE: Multidetector CT imaging of the abdomen and pelvis was performed using the standard protocol following bolus administration of intravenous contrast. CONTRAST:  OMNIPAQUE IOHEXOL 300 MG/ML  SOLN COMPARISON:  None. FINDINGS: Lower chest: Lung bases are clear. Normal heart size. No pericardial effusion. Coronary artery calcifications are present. Hepatobiliary: No worrisome focal liver  abnormality is seen. Normal gallbladder. No visible calcified gallstones. No  biliary ductal dilatation. Pancreas: Unremarkable. No pancreatic ductal dilatation or surrounding inflammatory changes. Spleen: Normal in size without focal abnormality. Adrenals/Urinary Tract: Some lobular thickening of the adrenal glands without discerning nodule, can reflect senescent change. Kidneys enhance and excrete symmetrically. There are bilateral areas of cortical scarring. No concerning renal masses. No urolithiasis or hydronephrosis. Mild bilateral symmetric perinephric stranding, a nonspecific finding though may correlate with either age or decreased renal function. Mild circumferential bladder wall thickening. Stomach/Bowel: Distal esophagus, stomach and duodenal sweep are unremarkable. No small bowel wall thickening or dilatation. No evidence of obstruction. A normal appendix is visualized. No colonic dilatation or wall thickening. Vascular/Lymphatic: Atherosclerotic calcifications throughout the abdominal aorta and branch vessels. No aneurysm or ectasia. No enlarged abdominopelvic lymph nodes. Reproductive: Fibroid uterus.  No concerning adnexal lesions. Other: Skin thickening and ulceration superficial to the right posterior sacrum with small amount subcutaneous gas and overlying skin thickening and as well as phlegmonous stranding. No organized collection or abscess is seen. Mild diffuse skin thickening of the posterior body wall and gluteal soft tissues. Correlate for features of cellulitis. No abdominopelvic free air or fluid. No bowel containing hernias. Tiny supraumbilical fat containing ventral hernia. Musculoskeletal: Acute to subacute fracture of the fifth sacral segment. This is best demonstrated on sagittal imaging 6/87 where abrupt cortical angulation with transcortical lucency is noted. No convincing early CT features of osteomyelitis subjacent to the sacral decubitus ulceration. There is however in irregular  area of cortical erosive change involving left femoral neck with some surrounding synovitis (5/59). Findings on a background of more diffuse degenerative changes of the spine hips and pelvis. Partial ankylosis at the lumbosacral junction. IMPRESSION: 1. Skin thickening and ulceration superficial to the right posterior sacrum with small amount subcutaneous gas compatible with sacral decubitus ulceration. Base of this ulceration is just superficial to the sacral bone without organized collection, abscess or CT evidence of osteomyelitis. 2. Additional mild diffuse skin thickening of the posterior body wall and gluteal soft tissues. Correlate for features of cellulitis. 3. Irregular area of cortical erosive lucency involving left femoral neck with some surrounding synovitis, correlate for clinical symptoms as a septic joint is not excluded. 4. Acute to subacute appearing fracture of the fifth sacral segment. 5. Mild circumferential bladder could reflect a cystitis. Correlate with urinalysis findings. 6. Aortic Atherosclerosis (ICD10-I70.0). Electronically Signed   By: Kreg Shropshire M.D.   On: 11/05/2019 19:12      Assessment/Plan  Sepsis secondary to cellulitis of sacral decubitus wound Continue IV vancomycin and Rocephin Wound care consult  Hypotension secondary to sepsis Improved and is actually slightly hypertensive following 2 L IV fluid.  We will continue to monitor overnight. Hold metoprolol  Acute metabolic encephalopathy secondary to sepsis with history of dementia Patient currently only alert and oriented to self.   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  Will monitor repeat CMP in the morning.  Insulin-dependent type 2 diabetes Normally on 30 units of Toujeo Start with 20 units Lantus and sensitive sliding scale  DVT prophylaxis:.Lovenox Code Status: Full Family Communication: No family at bedside  disposition Plan: Home with at least 2 midnight stays  Consults called:  Admission status: inpatient   Status is: Inpatient  Remains inpatient appropriate because:IV treatments appropriate due to intensity of illness or inability to take PO   Dispo: The patient is from: SNF              Anticipated d/c is to: SNF              Anticipated d/c date is: > 3 days              Patient currently is not medically stable to d/c.         Anselm Junglinghing T Jinny Sweetland DO [facility]   If 7PM-7AM, please contact night-coverage [email or web address]   11/05/2019, 8:13 PM

## 2019-11-05 NOTE — Progress Notes (Signed)
PIV /lab draw. Labs drawn with PIV start. Pt required frequent redirection and positioning. Unable to get ice for lactic acid for approximately 10 min. Please consider this when evaluating results.

## 2019-11-05 NOTE — Progress Notes (Signed)
A consult was received from an ED physician for Vancomycin per pharmacy dosing.  The patient's profile has been reviewed for ht/wt/allergies/indication/available labs.   A one time order has been placed for Vancomycin 2gm IV.  Further antibiotics/pharmacy consults should be ordered by admitting physician if indicated.                       Thank you, Junita Push PharmD 11/05/2019  5:57 PM

## 2019-11-06 LAB — CBC
HCT: 30.7 % — ABNORMAL LOW (ref 36.0–46.0)
Hemoglobin: 9.8 g/dL — ABNORMAL LOW (ref 12.0–15.0)
MCH: 27.1 pg (ref 26.0–34.0)
MCHC: 31.9 g/dL (ref 30.0–36.0)
MCV: 84.8 fL (ref 80.0–100.0)
Platelets: 350 10*3/uL (ref 150–400)
RBC: 3.62 MIL/uL — ABNORMAL LOW (ref 3.87–5.11)
RDW: 16.6 % — ABNORMAL HIGH (ref 11.5–15.5)
WBC: 24.4 10*3/uL — ABNORMAL HIGH (ref 4.0–10.5)
nRBC: 0 % (ref 0.0–0.2)

## 2019-11-06 LAB — URINALYSIS, ROUTINE W REFLEX MICROSCOPIC
Bilirubin Urine: NEGATIVE
Glucose, UA: NEGATIVE mg/dL
Ketones, ur: NEGATIVE mg/dL
Nitrite: POSITIVE — AB
Protein, ur: 30 mg/dL — AB
RBC / HPF: >50 RBC/hpf — ABNORMAL HIGH (ref 0–5)
Specific Gravity, Urine: 1.036 — ABNORMAL HIGH (ref 1.005–1.030)
WBC, UA: >50 WBC/hpf — ABNORMAL HIGH (ref 0–5)
pH: 5 (ref 5.0–8.0)

## 2019-11-06 LAB — HEMOGLOBIN A1C
Hgb A1c MFr Bld: 7.6 % — ABNORMAL HIGH (ref 4.8–5.6)
Mean Plasma Glucose: 171.42 mg/dL

## 2019-11-06 LAB — BASIC METABOLIC PANEL
Anion gap: 10 (ref 5–15)
BUN: 25 mg/dL — ABNORMAL HIGH (ref 8–23)
CO2: 21 mmol/L — ABNORMAL LOW (ref 22–32)
Calcium: 8 mg/dL — ABNORMAL LOW (ref 8.9–10.3)
Chloride: 104 mmol/L (ref 98–111)
Creatinine, Ser: 0.68 mg/dL (ref 0.44–1.00)
GFR calc Af Amer: >60 mL/min (ref >60)
GFR calc non Af Amer: >60 mL/min (ref >60)
Glucose, Bld: 264 mg/dL — ABNORMAL HIGH (ref 70–99)
Potassium: 3.9 mmol/L (ref 3.5–5.1)
Sodium: 135 mmol/L (ref 135–145)

## 2019-11-06 LAB — IRON AND TIBC
Iron: 17 ug/dL — ABNORMAL LOW (ref 28–170)
Saturation Ratios: 12 % (ref 10.4–31.8)
TIBC: 140 ug/dL — ABNORMAL LOW (ref 250–450)
UIBC: 123 ug/dL

## 2019-11-06 LAB — COMPREHENSIVE METABOLIC PANEL
ALT: 47 U/L — ABNORMAL HIGH (ref 0–44)
AST: 36 U/L (ref 15–41)
Albumin: 2.1 g/dL — ABNORMAL LOW (ref 3.5–5.0)
Alkaline Phosphatase: 101 U/L (ref 38–126)
Anion gap: 9 (ref 5–15)
BUN: 31 mg/dL — ABNORMAL HIGH (ref 8–23)
CO2: 24 mmol/L (ref 22–32)
Calcium: 8.2 mg/dL — ABNORMAL LOW (ref 8.9–10.3)
Chloride: 108 mmol/L (ref 98–111)
Creatinine, Ser: 0.66 mg/dL (ref 0.44–1.00)
GFR calc Af Amer: >60 mL/min (ref >60)
GFR calc non Af Amer: >60 mL/min (ref >60)
Glucose, Bld: 232 mg/dL — ABNORMAL HIGH (ref 70–99)
Potassium: 4.2 mmol/L (ref 3.5–5.1)
Sodium: 141 mmol/L (ref 135–145)
Total Bilirubin: 0.5 mg/dL (ref 0.3–1.2)
Total Protein: 6.7 g/dL (ref 6.5–8.1)

## 2019-11-06 LAB — CBG MONITORING, ED
Glucose-Capillary: 187 mg/dL — ABNORMAL HIGH (ref 70–99)
Glucose-Capillary: 210 mg/dL — ABNORMAL HIGH (ref 70–99)

## 2019-11-06 LAB — FOLATE: Folate: 14.7 ng/mL (ref >5.9)

## 2019-11-06 LAB — HIV ANTIBODY (ROUTINE TESTING W REFLEX): HIV Screen 4th Generation wRfx: NONREACTIVE

## 2019-11-06 LAB — GLUCOSE, CAPILLARY
Glucose-Capillary: 209 mg/dL — ABNORMAL HIGH (ref 70–99)
Glucose-Capillary: 160 mg/dL — ABNORMAL HIGH (ref 70–99)

## 2019-11-06 LAB — MRSA PCR SCREENING: MRSA by PCR: NEGATIVE

## 2019-11-06 LAB — VITAMIN B12: Vitamin B-12: 1032 pg/mL — ABNORMAL HIGH (ref 180–914)

## 2019-11-06 MED ORDER — SODIUM CHLORIDE 0.9 % IV SOLN
510.0000 mg | Freq: Once | INTRAVENOUS | Status: AC
  Administered 2019-11-06: 510 mg via INTRAVENOUS
  Filled 2019-11-06: qty 510

## 2019-11-06 MED ORDER — SODIUM CHLORIDE 0.9 % IV SOLN: INTRAVENOUS | Status: DC

## 2019-11-06 MED ORDER — INSULIN GLARGINE 100 UNIT/ML ~~LOC~~ SOLN
25.0000 [IU] | Freq: Every day | SUBCUTANEOUS | Status: DC
  Administered 2019-11-08 – 2019-11-12 (×4): 25 [IU] via SUBCUTANEOUS
  Filled 2019-11-06 (×6): qty 0.25

## 2019-11-06 MED ORDER — COLLAGENASE 250 UNIT/GM EX OINT
TOPICAL_OINTMENT | Freq: Every day | CUTANEOUS | Status: DC
  Administered 2019-11-09: 1 via TOPICAL
  Filled 2019-11-06 (×2): qty 30

## 2019-11-06 NOTE — Consult Note (Signed)
WOC Nurse Consult Note: Reason for Consult:Unstageable pressure injuries to sacrum, left lateral lower leg and right lateral foot near heel. Present on admission.   Wound type:unstageable pressure injuries Pressure Injury POA: Yes Measurement:Left lateral leg:  12 cm x 5 cm with eschar to wound bed. Right lateral foot near heel:  2 cm round intact eschar Sacrum:  4 cm x 4.5 cm wound bed is 50% eschar Wound AST:[id number] tissue  Drainage (amount, consistency, odor) moderate serosanguinous  With musty odor.  Periwound: scabbed, devitalized tissue Dressing procedure/placement/frequency: Cleanse sacral and left lateral leg wound with NS and pat dry.  Apply santyl to devitalized tissue.  Fill dead space with NS moist gauze.  Cover with dry dressing and ABD pad/tape.  Change daily.  Paint right lateral foot wound with betadine daily.  Will not follow at this time.  Please re-consult if needed.  Maple Hudson MSN, RN, FNP-BC CWON Wound, Ostomy, Continence Nurse Pager [phone]

## 2019-11-06 NOTE — ED Notes (Signed)
Patient was wet. Patient was bathed. Clean gown, Clean pads, Clean Purewick, Clean Pillow cases, Warm Blanket given, Patient repositioned on back, pillow wedge support on right side, wedge support under knees, pillow between knees.

## 2019-11-06 NOTE — ED Notes (Signed)
Observed patient not swallowing food, pocketing.

## 2019-11-06 NOTE — Progress Notes (Signed)
PROGRESS NOTE    Alyssa Haley  KGY:[id number] DOB: 1950/10/06 DOA: 11/05/2019 PCP: Eloisa Northern, MD   Brief Narrative: Patient is a 69 year old female with history of dementia, coronary artery disease, hypertension, DVT , type 2 diabetes mellitus, hyperlipidemia who was sent from nursing facility for the evaluation of altered mental status.  Nursing staff at [facility] health facility noticed change in her mental status and also increased pain medication.  She has sacral wound and also ulcers on the left lower extremity and right heel.  She follows with wound care  as an outpatient .  On presentation she had mild grade fever, tachycardic, initially hypotensive with significant leukocytosis, lactate of 2.  CT abdomen showed right posterior sacral decubitus ulcer without abscess or evidence of osteomyelitis.  There was concern for cellulitis.  She was admitted for management of possible infected decubitus ulcers.  Assessment & Plan:   Principal Problem:   Sepsis (HCC) Active Problems:   Uncontrolled type 2 diabetes mellitus with hyperglycemia (HCC)   Hypotension   AMS (altered mental status)   Stage IV pressure ulcer of sacral region (HCC)   Wound of lower extremity, initial encounter   Transaminitis   Suspected sepsis: Secondary to cellulitis of the sacral decubitus wound.  Started on vancomycin Rocephin.  Follow-up cultures. Wound culture showed mixed organisms. Continue current antibiotics.  Wound care consulted. She has severe leukocytosis.  Monitor CBC.  Hypotension: Currently normotensive.  She responded to IV fluids.  Metoprolol on hold.  There was concern for sepsis.Continue gentle IV fluids,she looks dehydrated.  Extensive pressure ulcers: She has sacral decubitus ulcer, ulcer on the lateral lower extremity, right heel ulcer.  Continue wound care.  Acute metabolic encephalopathy on the background of dementia: Currently she is oriented to self only.  Alert and awake and follows  commands.  As per the daughter ,she is not oriented to time.  Gabapentin and trazodone on hold.She is also on lot of pain meds which cud have contributed to increased lethargy/weakness/confusion.  Currently she is at her baseline mental status.  Normocytic anemia: Low iron.  We will give her a dose of IV iron.  Continue oral iron supplementation on discharge.  Mild transaminitis: Improved  Insulin-dependent diabetes type 2: On insulin at baseline.  Continue current insulin regimen.  History of DVT: Previously on Eliquis and Xarelto but not in any anticoagulation currently  Chronic pain syndrome: Continue pain medications,I have decreased the dosage.          DVT prophylaxis:Lovenox Code Status: Full Family Communication: Daughter on phone Status is: Inpatient  Remains inpatient appropriate because:IV treatments appropriate due to intensity of illness or inability to take PO   Dispo: The patient is from: SNF              Anticipated d/c is to: SNF              Anticipated d/c date is:1- 2 days              Patient currently is not medically stable to d/c.     Consultants: None  Procedures:None  Antimicrobials:  Anti-infectives (From admission, onward)   Start     Dose/Rate Route Frequency Ordered Stop   11/06/19 1800  cefTRIAXone (ROCEPHIN) 1 g in sodium chloride 0.9 % 100 mL IVPB     Discontinue     1 g 200 mL/hr over 30 Minutes Intravenous Every 24 hours 11/05/19 2010     11/06/19 1000  vancomycin (VANCOCIN) IVPB 1000  mg/200 mL premix     Discontinue     1,000 mg 200 mL/hr over 60 Minutes Intravenous Every 12 hours 11/05/19 2039     11/05/19 2045  vancomycin (VANCOREADY) IVPB 2000 mg/400 mL  Status:  Discontinued        2,000 mg 200 mL/hr over 120 Minutes Intravenous  Once 11/05/19 2030 11/05/19 2037   11/05/19 2045  vancomycin (VANCOCIN) IVPB 1000 mg/200 mL premix        1,000 mg 200 mL/hr over 60 Minutes Intravenous Every 1 hr x 2 11/05/19 2037 11/06/19 0127    11/05/19 2015  vancomycin (VANCOCIN) IVPB 1000 mg/200 mL premix  Status:  Discontinued        1,000 mg 200 mL/hr over 60 Minutes Intravenous  Once 11/05/19 2010 11/05/19 2030   11/05/19 1845  cefTRIAXone (ROCEPHIN) 2 g in sodium chloride 0.9 % 100 mL IVPB        2 g 200 mL/hr over 30 Minutes Intravenous  Once 11/05/19 1838 11/05/19 2014   11/05/19 1830  aztreonam (AZACTAM) 2 g in sodium chloride 0.9 % 100 mL IVPB  Status:  Discontinued        2 g 200 mL/hr over 30 Minutes Intravenous  Once 11/05/19 1825 11/05/19 1844   11/05/19 1800  vancomycin (VANCOREADY) IVPB 2000 mg/400 mL  Status:  Discontinued        2,000 mg 200 mL/hr over 120 Minutes Intravenous  Once 11/05/19 1757 11/05/19 1825      Subjective: Patient seen and examined at the bedside this morning.  Currently hemodynamically stable.  Afebrile.  Her mental status might have improved today.  She is alert and awake but not oriented which  is her baseline .She looked dehydrated.  Objective: Vitals:   11/06/19 1230 11/06/19 1243 11/06/19 1300 11/06/19 1324  BP: (!) 152/96 (!) 137/99 128/79 115/82  Pulse: (!) 111 (!) 112 (!) 108 (!) 106  Resp: 19 11 19 18   Temp:   99.8 F (37.7 C) 98.7 F (37.1 C)  TempSrc:   Oral Oral  SpO2: 100% 100% 100% 100%  Weight:      Height:        Intake/Output Summary (Last 24 hours) at 11/06/2019 1421 Last data filed at 11/06/2019 1304 Gross per 24 hour  Intake 2500 ml  Output 200 ml  Net 2300 ml   Filed Weights   11/05/19 1910  Weight: 95.3 kg    Examination:  General exam: Deconditioned, debilitated, chronically looking Respiratory system: Bilateral equal air entry, normal vesicular breath sounds, no wheezes or crackles  Cardiovascular system: S1 & S2 heard, RRR. No JVD, murmurs, rubs, gallops or clicks. No pedal edema. Gastrointestinal system: Abdomen is nondistended, soft and nontender. No organomegaly or masses felt. Normal bowel sounds heard. Central nervous system: Alert and awake  but not oriented Extremities: No edema, no clubbing ,no cyanosis, pressure ulcers  skin: Sacral ulcer, bilateral lower extremity ulcers   Data Reviewed: I have personally reviewed following labs and imaging studies  CBC: Recent Labs  Lab 11/05/19 1717 11/06/19 0414  WBC 19.9* 24.4*  NEUTROABS 16.3*  --   HGB 10.4* 9.8*  HCT 33.3* 30.7*  MCV 84.5 84.8  PLT 371 350   Basic Metabolic Panel: Recent Labs  Lab 11/05/19 1717 11/06/19 0414  NA 140 141  K 4.6 4.2  CL 105 108  CO2 27 24  GLUCOSE 326* 232*  BUN 42* 31*  CREATININE 0.79 0.66  CALCIUM 8.6* 8.2*  GFR: Estimated Creatinine Clearance: 81.5 mL/min (by C-G formula based on SCr of 0.66 mg/dL). Liver Function Tests: Recent Labs  Lab 11/05/19 1717 11/06/19 0414  AST 46* 36  ALT 53* 47*  ALKPHOS 113 101  BILITOT 0.5 0.5  PROT 7.4 6.7  ALBUMIN 2.2* 2.1*   No results for input(s): LIPASE, AMYLASE in the last 168 hours. No results for input(s): AMMONIA in the last 168 hours. Coagulation Profile: Recent Labs  Lab 11/05/19 1900  INR 1.1   Cardiac Enzymes: No results for input(s): CKTOTAL, CKMB, CKMBINDEX, TROPONINI in the last 168 hours. BNP (last 3 results) No results for input(s): PROBNP in the last 8760 hours. HbA1C: Recent Labs    11/05/19 1714  HGBA1C 7.6*   CBG: Recent Labs  Lab 11/05/19 2146 11/06/19 0839 11/06/19 1144  GLUCAP 233* 187* 210*   Lipid Profile: No results for input(s): CHOL, HDL, LDLCALC, TRIG, CHOLHDL, LDLDIRECT in the last 72 hours. Thyroid Function Tests: No results for input(s): TSH, T4TOTAL, FREET4, T3FREE, THYROIDAB in the last 72 hours. Anemia Panel: Recent Labs    11/06/19 0414  VITAMINB12 1,032*  FOLATE 14.7  TIBC 140*  IRON 17*   Sepsis Labs: Recent Labs  Lab 11/05/19 1717 11/05/19 1900  LATICACIDVEN 2.0* 2.0*    Recent Results (from the past 240 hour(s))  Aerobic Culture (superficial specimen)     Status: None (Preliminary result)   Collection  Time: 11/05/19  3:54 PM   Specimen: Wound  Result Value Ref Range Status   Specimen Description   Final    WOUND RIGHT SHIN Performed at [facility], [address] [place], [place] [address]    Special Requests   Final    NONE Performed at [facility], [address] [place], [place] [address]    Gram Stain   Final    NO WBC SEEN FEW GRAM POSITIVE COCCI IN PAIRS RARE GRAM NEGATIVE RODS    Culture   Final    TOO YOUNG TO READ Performed at [facility], [address] [place], [place] [address]    Report Status PENDING  Incomplete  Aerobic Culture (superficial specimen)     Status: None (Preliminary result)   Collection Time: 11/05/19  3:54 PM   Specimen: Wound  Result Value Ref Range Status   Specimen Description   Final    WOUND SACRAL Performed at [facility], [address] [place], [place] [address]    Special Requests   Final    NONE Performed at [facility], [address] [place], [place] [address]    Gram Stain   Final    NO WBC SEEN FEW GRAM POSITIVE COCCI IN PAIRS FEW GRAM NEGATIVE RODS    Culture   Final    MODERATE GRAM NEGATIVE RODS CULTURE REINCUBATED FOR BETTER GROWTH Performed at [facility], [address] [place], [place] [address]    Report Status PENDING  Incomplete  Blood culture (routine x 2)     Status: None (Preliminary result)   Collection Time: 11/05/19  6:00 PM   Specimen: BLOOD  Result Value Ref Range Status   Specimen Description   Final    BLOOD RIGHT WRIST Performed at [facility], [address] [place], [place] [address]    Special Requests   Final    BOTTLES DRAWN AEROBIC AND ANAEROBIC Blood Culture adequate volume Performed at [facility], [address], [place], [place] [address]    Culture   Final    NO GROWTH < 12 HOURS Performed at [facility], [address]  [place], [place] [address]    Report Status PENDING  Incomplete  SARS Coronavirus 2 by RT PCR (hospital order, performed in [facility] hospital lab) Nasopharyngeal Nasopharyngeal Swab     Status: None   Collection Time: 11/05/19  9:00 PM   Specimen: Nasopharyngeal Swab  Result Value Ref Range Status   SARS Coronavirus 2 NEGATIVE NEGATIVE Final    Comment: (NOTE) SARS-CoV-2 target nucleic acids are NOT DETECTED.  The SARS-CoV-2 RNA is generally detectable in upper and lower respiratory specimens during the acute phase of infection. The lowest concentration of SARS-CoV-2 viral copies this assay can detect is 250 copies / mL. A negative result does not preclude SARS-CoV-2 infection and should not be used as the sole basis for treatment or other patient management decisions.  A negative result may occur with improper specimen collection / handling, submission of specimen other than nasopharyngeal swab, presence of viral mutation(s) within the areas targeted by this assay, and inadequate number of viral copies (<250 copies / mL). A negative result must be combined with clinical observations, patient history, and epidemiological information.  Fact Sheet for Patients:   BoilerBrush.com.cy  Fact Sheet for Healthcare Providers: https://pope.com/  This test is not yet approved or  cleared by the [place] FDA and has been authorized for detection and/or diagnosis of SARS-CoV-2 by FDA under an Emergency Use Authorization (EUA).  This EUA will remain in effect (meaning this test can be used) for the duration of the COVID-19 declaration under Section 564(b)(1) of the Act, 21 U.S.C. section 360bbb-3(b)(1), unless the authorization is terminated or revoked sooner.  Performed at [facility], [address], [place], [place] [address]          Radiology Studies: DG Chest 1 View  Result Date: 11/05/2019 CLINICAL DATA:   Skin infection. EXAM: CHEST  1 VIEW COMPARISON:  June 01, 2019. FINDINGS: The heart size and mediastinal contours are within normal limits. Both lungs are clear. The visualized skeletal structures are unremarkable. IMPRESSION: No active disease. Electronically Signed   By: Lupita Raider M.D.   On: 11/05/2019 17:17   DG Tibia/Fibula Left  Result Date: 11/05/2019 CLINICAL DATA:  Skin infection. EXAM: LEFT TIBIA AND FIBULA - 2 VIEW COMPARISON:  None. FINDINGS: There is no evidence of fracture or other focal bone lesions. Soft tissues are unremarkable. IMPRESSION: Negative. Electronically Signed   By: Lupita Raider M.D.   On: 11/05/2019 17:19   CT Head Wo Contrast  Result Date: 11/05/2019 CLINICAL DATA:  Altered mental status, unclear cause EXAM: CT HEAD WITHOUT CONTRAST TECHNIQUE: Contiguous axial images were obtained from the base of the skull through the vertex without intravenous contrast. COMPARISON:  None. FINDINGS: Brain: No evidence of acute infarction, hemorrhage, hydrocephalus, extra-axial collection or mass lesion/mass effect. Symmetric prominence of the cisterns and sulci compatible with parenchymal volume loss. There is pronounced ventriculomegaly out of proportion to the degree of sulcal dilatation of the lateral and third ventricles with a narrowed callosal septal angle and upward bowing of the tentorium which can be seen in the setting of normal pressure hydrocephalus. Patchy areas of white matter hypoattenuation are most compatible with chronic microvascular angiopathy. Vascular: No hyperdense vessel or unexpected calcification. Skull: No calvarial fracture or suspicious osseous lesion. No scalp swelling or hematoma. Sinuses/Orbits: Paranasal sinuses  and mastoid air cells are predominantly clear. Included orbital structures are unremarkable. Other: None IMPRESSION: 1. No acute intracranial abnormality. 2. Ventriculomegaly is slightly more pronounced in the volume loss seen elsewhere with  additional features which can be seen in the setting of normal pressure hydrocephalus. Correlate with clinical exam findings. 3. Chronic microvascular angiopathy and parenchymal volume loss. Electronically Signed   By: Kreg Shropshire M.D.   On: 11/05/2019 19:00   CT ABDOMEN PELVIS W CONTRAST  Result Date: 11/05/2019 CLINICAL DATA:  Diffuse skin defect shin, decreased mental status and mobility EXAM: CT ABDOMEN AND PELVIS WITH CONTRAST TECHNIQUE: Multidetector CT imaging of the abdomen and pelvis was performed using the standard protocol following bolus administration of intravenous contrast. CONTRAST:  OMNIPAQUE IOHEXOL 300 MG/ML  SOLN COMPARISON:  None. FINDINGS: Lower chest: Lung bases are clear. Normal heart size. No pericardial effusion. Coronary artery calcifications are present. Hepatobiliary: No worrisome focal liver abnormality is seen. Normal gallbladder. No visible calcified gallstones. No biliary ductal dilatation. Pancreas: Unremarkable. No pancreatic ductal dilatation or surrounding inflammatory changes. Spleen: Normal in size without focal abnormality. Adrenals/Urinary Tract: Some lobular thickening of the adrenal glands without discerning nodule, can reflect senescent change. Kidneys enhance and excrete symmetrically. There are bilateral areas of cortical scarring. No concerning renal masses. No urolithiasis or hydronephrosis. Mild bilateral symmetric perinephric stranding, a nonspecific finding though may correlate with either age or decreased renal function. Mild circumferential bladder wall thickening. Stomach/Bowel: Distal esophagus, stomach and duodenal sweep are unremarkable. No small bowel wall thickening or dilatation. No evidence of obstruction. A normal appendix is visualized. No colonic dilatation or wall thickening. Vascular/Lymphatic: Atherosclerotic calcifications throughout the abdominal aorta and branch vessels. No aneurysm or ectasia. No enlarged abdominopelvic lymph nodes.  Reproductive: Fibroid uterus.  No concerning adnexal lesions. Other: Skin thickening and ulceration superficial to the right posterior sacrum with small amount subcutaneous gas and overlying skin thickening and as well as phlegmonous stranding. No organized collection or abscess is seen. Mild diffuse skin thickening of the posterior body wall and gluteal soft tissues. Correlate for features of cellulitis. No abdominopelvic free air or fluid. No bowel containing hernias. Tiny supraumbilical fat containing ventral hernia. Musculoskeletal: Acute to subacute fracture of the fifth sacral segment. This is best demonstrated on sagittal imaging 6/87 where abrupt cortical angulation with transcortical lucency is noted. No convincing early CT features of osteomyelitis subjacent to the sacral decubitus ulceration. There is however in irregular area of cortical erosive change involving left femoral neck with some surrounding synovitis (5/59). Findings on a background of more diffuse degenerative changes of the spine hips and pelvis. Partial ankylosis at the lumbosacral junction. IMPRESSION: 1. Skin thickening and ulceration superficial to the right posterior sacrum with small amount subcutaneous gas compatible with sacral decubitus ulceration. Base of this ulceration is just superficial to the sacral bone without organized collection, abscess or CT evidence of osteomyelitis. 2. Additional mild diffuse skin thickening of the posterior body wall and gluteal soft tissues. Correlate for features of cellulitis. 3. Irregular area of cortical erosive lucency involving left femoral neck with some surrounding synovitis, correlate for clinical symptoms as a septic joint is not excluded. 4. Acute to subacute appearing fracture of the fifth sacral segment. 5. Mild circumferential bladder could reflect a cystitis. Correlate with urinalysis findings. 6. Aortic Atherosclerosis (ICD10-I70.0). Electronically Signed   By: Kreg Shropshire M.D.   On:  11/05/2019 19:12        Scheduled Meds: . allopurinol  100 mg Oral  BID  . atorvastatin  80 mg Oral QHS  . collagenase   Topical Daily  . docusate sodium  100 mg Oral QHS  . enoxaparin (LOVENOX) injection  40 mg Subcutaneous Q24H  . insulin aspart  0-5 Units Subcutaneous QHS  . insulin aspart  0-9 Units Subcutaneous TID WC  . insulin glargine  20 Units Subcutaneous Daily  . melatonin  3 mg Oral QHS  . morphine  15 mg Oral QHS  . morphine  30 mg Oral Daily  . pantoprazole  40 mg Oral Daily  . polyethylene glycol  17 g Oral Daily  . sertraline  50 mg Oral Daily   Continuous Infusions: . cefTRIAXone (ROCEPHIN)  IV    . vancomycin Stopped (11/06/19 1237)     LOS: 1 day    Time spent:35 mins. More than 50% of that time was spent in counseling and/or coordination of care.      Burnadette Pop, MD [facility] P7/06/2019, 2:21 PM

## 2019-11-07 LAB — CBC WITH DIFFERENTIAL/PLATELET
Abs Immature Granulocytes: 0.52 10*3/uL — ABNORMAL HIGH (ref 0.00–0.07)
Basophils Absolute: 0.1 10*3/uL (ref 0.0–0.1)
Basophils Relative: 0 %
Eosinophils Absolute: 0.2 10*3/uL (ref 0.0–0.5)
Eosinophils Relative: 1 %
HCT: 27.9 % — ABNORMAL LOW (ref 36.0–46.0)
Hemoglobin: 8.9 g/dL — ABNORMAL LOW (ref 12.0–15.0)
Immature Granulocytes: 3 %
Lymphocytes Relative: 12 %
Lymphs Abs: 2.5 10*3/uL (ref 0.7–4.0)
MCH: 26.4 pg (ref 26.0–34.0)
MCHC: 31.9 g/dL (ref 30.0–36.0)
MCV: 82.8 fL (ref 80.0–100.0)
Monocytes Absolute: 1.3 10*3/uL — ABNORMAL HIGH (ref 0.1–1.0)
Monocytes Relative: 6 %
Neutro Abs: 16.1 10*3/uL — ABNORMAL HIGH (ref 1.7–7.7)
Neutrophils Relative %: 78 %
Platelets: 266 10*3/uL (ref 150–400)
RBC: 3.37 MIL/uL — ABNORMAL LOW (ref 3.87–5.11)
RDW: 16.4 % — ABNORMAL HIGH (ref 11.5–15.5)
WBC: 20.6 10*3/uL — ABNORMAL HIGH (ref 4.0–10.5)
nRBC: 0 % (ref 0.0–0.2)

## 2019-11-07 LAB — GLUCOSE, CAPILLARY
Glucose-Capillary: 94 mg/dL (ref 70–99)
Glucose-Capillary: 123 mg/dL — ABNORMAL HIGH (ref 70–99)
Glucose-Capillary: 144 mg/dL — ABNORMAL HIGH (ref 70–99)
Glucose-Capillary: 177 mg/dL — ABNORMAL HIGH (ref 70–99)

## 2019-11-07 LAB — URINE CULTURE

## 2019-11-07 NOTE — Progress Notes (Signed)
PROGRESS NOTE    Alyssa HeritageGeraldine Hulsey  ZOX:[id number] DOB: Dec 15, 1950 DOA: 11/05/2019   PCP: Eloisa NorthernAmin, Saad, MD  Brief Narrative:  Patient is a 69 year old female with history of dementia, coronary artery disease, hypertension, DVT , type 2 diabetes mellitus, hyperlipidemia who was sent from nursing facility for the evaluation of altered mental status.  [facility] noticed change in her mental status and also increased pain medication.  She has sacral wound and also ulcers on the left lower extremity and right heel.  She follows with wound care as an outpatient .  On presentation she had mild grade fever, tachycardic, initially hypotensive with significant leukocytosis, lactate of 2.  CT abdomen showed right posterior sacral decubitus ulcer without abscess or evidence of osteomyelitis.  There was concern for cellulitis. She was admitted for management of possible infected decubitus ulcers.   Assessment & Plan:   Principal Problem:   Sepsis (HCC) Active Problems:   Uncontrolled type 2 diabetes mellitus with hyperglycemia (HCC)   Hypotension   AMS (altered mental status)   Stage IV pressure ulcer of sacral region (HCC)   Wound of lower extremity, initial encounter   Transaminitis  Suspected sepsis: Secondary to cellulitis of the sacral decubitus wound.  Started on vancomycin, Rocephin.  Follow-up cultures. Wound culture showed mixed organisms. Continue current antibiotics.  Wound care consulted. She has severe leukocytosis.  Monitor CBC.  Hypotension: Currently normotensive.  She responded to IV fluids.  Metoprolol on hold.  There was concern for sepsis. Continue gentle IV fluids, she looks dehydrated.  Extensive pressure ulcers: She has sacral decubitus ulcer, ulcer on the lateral lower extremity, right heel ulcer.  Continue wound care.  Acute metabolic encephalopathy on the background of dementia: Currently she is oriented to self only.  Alert and awake and  follows commands.  As per the daughter, she is not oriented to time.  Gabapentin and trazodone on hold. She is also on lot of pain meds which cud have contributed to increased lethargy/weakness/confusion.  Currently she is at her baseline mental status.  Normocytic anemia: Low iron.  We will give her a dose of IV iron.  Continue oral iron supplementation on discharge.  Mild transaminitis: Improved  Insulin-dependent diabetes type 2: On insulin at baseline.  Continue current insulin regimen.  History of DVT: Previously on Eliquis and Xarelto but not in any anticoagulation currently  Chronic pain syndrome: Continue pain medications,I have decreased the dosage.    DVT prophylaxis:Lovenox Code Status: Full Family Communication: Daughter on phone Status is: Inpatient  Remains inpatient appropriate because:IV treatments appropriate due to intensity of illness or inability to take PO   Dispo: The patient is from: SNF  Anticipated d/c is to: SNF  Anticipated d/c date is:1- 2 days  Patient currently is not medically stable to d/c.    Consultants:    None  Procedures:  None  Antimicrobials:  Anti-infectives (From admission, onward)   Start     Dose/Rate Route Frequency Ordered Stop   11/06/19 1800  cefTRIAXone (ROCEPHIN) 1 g in sodium chloride 0.9 % 100 mL IVPB     Discontinue     1 g 200 mL/hr over 30 Minutes Intravenous Every 24 hours 11/05/19 2010     11/06/19 1000  vancomycin (VANCOCIN) IVPB 1000 mg/200 mL premix     Discontinue     1,000 mg 200 mL/hr over 60 Minutes Intravenous Every 12 hours 11/05/19 2039     11/05/19 2045  vancomycin (VANCOREADY) IVPB  2000 mg/400 mL  Status:  Discontinued        2,000 mg 200 mL/hr over 120 Minutes Intravenous  Once 11/05/19 2030 11/05/19 2037   11/05/19 2045  vancomycin (VANCOCIN) IVPB 1000 mg/200 mL premix        1,000 mg 200 mL/hr over 60 Minutes Intravenous Every 1 hr x 2 11/05/19 2037  11/06/19 0127   11/05/19 2015  vancomycin (VANCOCIN) IVPB 1000 mg/200 mL premix  Status:  Discontinued        1,000 mg 200 mL/hr over 60 Minutes Intravenous  Once 11/05/19 2010 11/05/19 2030   11/05/19 1845  cefTRIAXone (ROCEPHIN) 2 g in sodium chloride 0.9 % 100 mL IVPB        2 g 200 mL/hr over 30 Minutes Intravenous  Once 11/05/19 1838 11/05/19 2014   11/05/19 1830  aztreonam (AZACTAM) 2 g in sodium chloride 0.9 % 100 mL IVPB  Status:  Discontinued        2 g 200 mL/hr over 30 Minutes Intravenous  Once 11/05/19 1825 11/05/19 1844   11/05/19 1800  vancomycin (VANCOREADY) IVPB 2000 mg/400 mL  Status:  Discontinued        2,000 mg 200 mL/hr over 120 Minutes Intravenous  Once 11/05/19 1757 11/05/19 1825      Subjective: Patient was seen and examined at bedside.  No overnight events.  Patient is hemodynamically stable afebrile.  She is alert and awake but not oriented which seems like her baseline.  Objective: Vitals:   11/06/19 1324 11/06/19 1714 11/06/19 2040 11/07/19 0135  BP: 115/82 (!) 147/71 (!) 122/53 136/77  Pulse: (!) 106 98 96   Resp: 18 18 18    Temp: 98.7 F (37.1 C) 98.9 F (37.2 C) 97.9 F (36.6 C) 98 F (36.7 C)  TempSrc: Oral Oral    SpO2: 100% 99% 100% 94%  Weight:      Height:        Intake/Output Summary (Last 24 hours) at 11/07/2019 1307 Last data filed at 11/07/2019 0900 Gross per 24 hour  Intake 1661.93 ml  Output 950 ml  Net 711.93 ml   Filed Weights   11/05/19 1910  Weight: 95.3 kg    Examination:  General exam: Appears Deconditioned , debilitated and chronic looking. Respiratory system: Clear to auscultation. Respiratory effort normal. Cardiovascular system: S1 & S2 heard, RRR. No JVD, murmurs, rubs, gallops or clicks. No pedal edema. Gastrointestinal system: Abdomen is nondistended, soft and nontender. No organomegaly or masses felt. Normal bowel sounds heard. Central nervous system: Alert and oriented. No focal neurological  deficits. Extremities: No edema, no clubbing, no cyanosis,  Skin: Sacral ulcer, bilateral lower extremity ulcers   Data Reviewed: I have personally reviewed following labs and imaging studies  CBC: Recent Labs  Lab 11/05/19 1717 11/06/19 0414 11/07/19 0327  WBC 19.9* 24.4* 20.6*  NEUTROABS 16.3*  --  16.1*  HGB 10.4* 9.8* 8.9*  HCT 33.3* 30.7* 27.9*  MCV 84.5 84.8 82.8  PLT 371 350 266   Basic Metabolic Panel: Recent Labs  Lab 11/05/19 1717 11/06/19 0414 11/06/19 1500  NA 140 141 135  K 4.6 4.2 3.9  CL 105 108 104  CO2 27 24 21*  GLUCOSE 326* 232* 264*  BUN 42* 31* 25*  CREATININE 0.79 0.66 0.68  CALCIUM 8.6* 8.2* 8.0*   GFR: Estimated Creatinine Clearance: 81.5 mL/min (by C-G formula based on SCr of 0.68 mg/dL). Liver Function Tests: Recent Labs  Lab 11/05/19 1717 11/06/19 0414  AST  46* 36  ALT 53* 47*  ALKPHOS 113 101  BILITOT 0.5 0.5  PROT 7.4 6.7  ALBUMIN 2.2* 2.1*   No results for input(s): LIPASE, AMYLASE in the last 168 hours. No results for input(s): AMMONIA in the last 168 hours. Coagulation Profile: Recent Labs  Lab 11/05/19 1900  INR 1.1   Cardiac Enzymes: No results for input(s): CKTOTAL, CKMB, CKMBINDEX, TROPONINI in the last 168 hours. BNP (last 3 results) No results for input(s): PROBNP in the last 8760 hours. HbA1C: Recent Labs    11/05/19 1714  HGBA1C 7.6*   CBG: Recent Labs  Lab 11/06/19 1144 11/06/19 1710 11/06/19 2042 11/07/19 0733 11/07/19 1233  GLUCAP 210* 209* 160* 94 123*   Lipid Profile: No results for input(s): CHOL, HDL, LDLCALC, TRIG, CHOLHDL, LDLDIRECT in the last 72 hours. Thyroid Function Tests: No results for input(s): TSH, T4TOTAL, FREET4, T3FREE, THYROIDAB in the last 72 hours. Anemia Panel: Recent Labs    11/06/19 0414  VITAMINB12 1,032*  FOLATE 14.7  TIBC 140*  IRON 17*   Sepsis Labs: Recent Labs  Lab 11/05/19 1717 11/05/19 1900  LATICACIDVEN 2.0* 2.0*    Recent Results (from the past  240 hour(s))  Aerobic Culture (superficial specimen)     Status: None (Preliminary result)   Collection Time: 11/05/19  3:54 PM   Specimen: Wound  Result Value Ref Range Status   Specimen Description   Final    WOUND RIGHT SHIN Performed at [facility], [address] [place], [place] [address]    Special Requests   Final    NONE Performed at [facility], [address], [place], [place] [address]    Gram Stain   Final    NO WBC SEEN FEW GRAM POSITIVE COCCI IN PAIRS RARE GRAM NEGATIVE RODS Performed at [facility], [address], [place], [place] [address]    Culture FEW GRAM NEGATIVE RODS  Final   Report Status PENDING  Incomplete  Aerobic Culture (superficial specimen)     Status: None (Preliminary result)   Collection Time: 11/05/19  3:54 PM   Specimen: Wound  Result Value Ref Range Status   Specimen Description   Final    WOUND SACRAL Performed at [facility], [address], [place], [place] [address]    Special Requests   Final    NONE Performed at [facility], [address], [place], [place] [address]    Gram Stain   Final    NO WBC SEEN FEW GRAM POSITIVE COCCI IN PAIRS FEW GRAM NEGATIVE RODS    Culture   Final    CULTURE REINCUBATED FOR BETTER GROWTH Performed at [facility], [address] [place], [place] [address]    Report Status PENDING  Incomplete  Blood culture (routine x 2)     Status: None (Preliminary result)   Collection Time: 11/05/19  6:00 PM   Specimen: BLOOD  Result Value Ref Range Status   Specimen Description   Final    BLOOD RIGHT WRIST Performed at [facility], [address] [place], [place] [address]    Special Requests   Final    BOTTLES DRAWN AEROBIC AND ANAEROBIC Blood Culture adequate volume Performed at [facility], [address], [place], [place] [address]    Culture   Final    NO GROWTH  < 12 HOURS Performed at [facility], [address],  [place], [place] [address]    Report Status PENDING  Incomplete  SARS Coronavirus 2 by RT PCR (hospital order, performed in [facility] hospital lab) Nasopharyngeal Nasopharyngeal Swab     Status: None   Collection Time: 11/05/19  9:00 PM   Specimen: Nasopharyngeal Swab  Result Value Ref Range Status   SARS Coronavirus 2 NEGATIVE NEGATIVE Final    Comment: (NOTE) SARS-CoV-2 target nucleic acids are NOT DETECTED.  The SARS-CoV-2 RNA is generally detectable in upper and lower respiratory specimens during the acute phase of infection. The lowest concentration of SARS-CoV-2 viral copies this assay can detect is 250 copies / mL. A negative result does not preclude SARS-CoV-2 infection and should not be used as the sole basis for treatment or other patient management decisions.  A negative result may occur with improper specimen collection / handling, submission of specimen other than nasopharyngeal swab, presence of viral mutation(s) within the areas targeted by this assay, and inadequate number of viral copies (<250 copies / mL). A negative result must be combined with clinical observations, patient history, and epidemiological information.  Fact Sheet for Patients:   BoilerBrush.com.cy  Fact Sheet for Healthcare Providers: https://pope.com/  This test is not yet approved or  cleared by the [place] FDA and has been authorized for detection and/or diagnosis of SARS-CoV-2 by FDA under an Emergency Use Authorization (EUA).  This EUA will remain in effect (meaning this test can be used) for the duration of the COVID-19 declaration under Section 564(b)(1) of the Act, 21 U.S.C. section 360bbb-3(b)(1), unless the authorization is terminated or revoked sooner.  Performed at [facility], [address] [place], [place] [address]   Urine culture     Status:  Abnormal   Collection Time: 11/06/19  8:34 AM   Specimen: Urine, Random  Result Value Ref Range Status   Specimen Description   Final    URINE, RANDOM Performed at [facility], [address] [place], [place] [address]    Special Requests   Final    NONE Performed at [facility], [address], [place], [place] [address]    Culture MULTIPLE SPECIES PRESENT, SUGGEST RECOLLECTION (A)  Final   Report Status 11/07/2019 FINAL  Final  MRSA PCR Screening     Status: None   Collection Time: 11/06/19  4:24 PM   Specimen: Nasopharyngeal  Result Value Ref Range Status   MRSA by PCR NEGATIVE NEGATIVE Final    Comment:        The GeneXpert MRSA Assay (FDA approved for NASAL specimens only), is one component of a comprehensive MRSA colonization surveillance program. It is not intended to diagnose MRSA infection nor to guide or monitor treatment for MRSA infections. Performed at [facility], [address] [place], [place] [address]     Radiology Studies: DG Chest 1 View  Result Date: 11/05/2019 CLINICAL DATA:  Skin infection. EXAM: CHEST  1 VIEW COMPARISON:  June 01, 2019. FINDINGS: The heart size and mediastinal contours are within normal limits. Both lungs are clear. The visualized skeletal structures are unremarkable. IMPRESSION: No active disease. Electronically Signed   By: Lupita Raider M.D.   On: 11/05/2019 17:17   DG Tibia/Fibula Left  Result Date: 11/05/2019 CLINICAL DATA:  Skin infection. EXAM: LEFT TIBIA AND FIBULA - 2 VIEW COMPARISON:  None. FINDINGS: There is no evidence of fracture or other focal bone lesions. Soft tissues are unremarkable. IMPRESSION: Negative. Electronically Signed  By: Lupita Raider M.D.   On: 11/05/2019 17:19   CT Head Wo Contrast  Result Date: 11/05/2019 CLINICAL DATA:  Altered mental status, unclear cause EXAM: CT HEAD WITHOUT CONTRAST TECHNIQUE: Contiguous axial images were obtained  from the base of the skull through the vertex without intravenous contrast. COMPARISON:  None. FINDINGS: Brain: No evidence of acute infarction, hemorrhage, hydrocephalus, extra-axial collection or mass lesion/mass effect. Symmetric prominence of the cisterns and sulci compatible with parenchymal volume loss. There is pronounced ventriculomegaly out of proportion to the degree of sulcal dilatation of the lateral and third ventricles with a narrowed callosal septal angle and upward bowing of the tentorium which can be seen in the setting of normal pressure hydrocephalus. Patchy areas of white matter hypoattenuation are most compatible with chronic microvascular angiopathy. Vascular: No hyperdense vessel or unexpected calcification. Skull: No calvarial fracture or suspicious osseous lesion. No scalp swelling or hematoma. Sinuses/Orbits: Paranasal sinuses and mastoid air cells are predominantly clear. Included orbital structures are unremarkable. Other: None IMPRESSION: 1. No acute intracranial abnormality. 2. Ventriculomegaly is slightly more pronounced in the volume loss seen elsewhere with additional features which can be seen in the setting of normal pressure hydrocephalus. Correlate with clinical exam findings. 3. Chronic microvascular angiopathy and parenchymal volume loss. Electronically Signed   By: Kreg Shropshire M.D.   On: 11/05/2019 19:00   CT ABDOMEN PELVIS W CONTRAST  Result Date: 11/05/2019 CLINICAL DATA:  Diffuse skin defect shin, decreased mental status and mobility EXAM: CT ABDOMEN AND PELVIS WITH CONTRAST TECHNIQUE: Multidetector CT imaging of the abdomen and pelvis was performed using the standard protocol following bolus administration of intravenous contrast. CONTRAST:  OMNIPAQUE IOHEXOL 300 MG/ML  SOLN COMPARISON:  None. FINDINGS: Lower chest: Lung bases are clear. Normal heart size. No pericardial effusion. Coronary artery calcifications are present. Hepatobiliary: No worrisome focal liver  abnormality is seen. Normal gallbladder. No visible calcified gallstones. No biliary ductal dilatation. Pancreas: Unremarkable. No pancreatic ductal dilatation or surrounding inflammatory changes. Spleen: Normal in size without focal abnormality. Adrenals/Urinary Tract: Some lobular thickening of the adrenal glands without discerning nodule, can reflect senescent change. Kidneys enhance and excrete symmetrically. There are bilateral areas of cortical scarring. No concerning renal masses. No urolithiasis or hydronephrosis. Mild bilateral symmetric perinephric stranding, a nonspecific finding though may correlate with either age or decreased renal function. Mild circumferential bladder wall thickening. Stomach/Bowel: Distal esophagus, stomach and duodenal sweep are unremarkable. No small bowel wall thickening or dilatation. No evidence of obstruction. A normal appendix is visualized. No colonic dilatation or wall thickening. Vascular/Lymphatic: Atherosclerotic calcifications throughout the abdominal aorta and branch vessels. No aneurysm or ectasia. No enlarged abdominopelvic lymph nodes. Reproductive: Fibroid uterus.  No concerning adnexal lesions. Other: Skin thickening and ulceration superficial to the right posterior sacrum with small amount subcutaneous gas and overlying skin thickening and as well as phlegmonous stranding. No organized collection or abscess is seen. Mild diffuse skin thickening of the posterior body wall and gluteal soft tissues. Correlate for features of cellulitis. No abdominopelvic free air or fluid. No bowel containing hernias. Tiny supraumbilical fat containing ventral hernia. Musculoskeletal: Acute to subacute fracture of the fifth sacral segment. This is best demonstrated on sagittal imaging 6/87 where abrupt cortical angulation with transcortical lucency is noted. No convincing early CT features of osteomyelitis subjacent to the sacral decubitus ulceration. There is however in irregular  area of cortical erosive change involving left femoral neck with some surrounding synovitis (5/59). Findings on a background of  more diffuse degenerative changes of the spine hips and pelvis. Partial ankylosis at the lumbosacral junction. IMPRESSION: 1. Skin thickening and ulceration superficial to the right posterior sacrum with small amount subcutaneous gas compatible with sacral decubitus ulceration. Base of this ulceration is just superficial to the sacral bone without organized collection, abscess or CT evidence of osteomyelitis. 2. Additional mild diffuse skin thickening of the posterior body wall and gluteal soft tissues. Correlate for features of cellulitis. 3. Irregular area of cortical erosive lucency involving left femoral neck with some surrounding synovitis, correlate for clinical symptoms as a septic joint is not excluded. 4. Acute to subacute appearing fracture of the fifth sacral segment. 5. Mild circumferential bladder could reflect a cystitis. Correlate with urinalysis findings. 6. Aortic Atherosclerosis (ICD10-I70.0). Electronically Signed   By: Kreg Shropshire M.D.   On: 11/05/2019 19:12    Scheduled Meds: . allopurinol  100 mg Oral BID  . atorvastatin  80 mg Oral QHS  . collagenase   Topical Daily  . docusate sodium  100 mg Oral QHS  . enoxaparin (LOVENOX) injection  40 mg Subcutaneous Q24H  . insulin aspart  0-5 Units Subcutaneous QHS  . insulin aspart  0-9 Units Subcutaneous TID WC  . insulin glargine  25 Units Subcutaneous Daily  . melatonin  3 mg Oral QHS  . pantoprazole  40 mg Oral Daily  . polyethylene glycol  17 g Oral Daily  . sertraline  50 mg Oral Daily   Continuous Infusions: . sodium chloride 75 mL/hr at 11/06/19 2048  . cefTRIAXone (ROCEPHIN)  IV 1 g (11/06/19 1728)  . vancomycin 1,000 mg (11/07/19 0958)     LOS: 2 days    Time spent: 25 mins.  Cipriano Bunker, MD [facility]   If 7PM-7AM, please contact night-coverage

## 2019-11-08 LAB — GLUCOSE, CAPILLARY
Glucose-Capillary: 136 mg/dL — ABNORMAL HIGH (ref 70–99)
Glucose-Capillary: 175 mg/dL — ABNORMAL HIGH (ref 70–99)
Glucose-Capillary: 211 mg/dL — ABNORMAL HIGH (ref 70–99)
Glucose-Capillary: 173 mg/dL — ABNORMAL HIGH (ref 70–99)

## 2019-11-08 LAB — CBC
HCT: 28.1 % — ABNORMAL LOW (ref 36.0–46.0)
Hemoglobin: 9.1 g/dL — ABNORMAL LOW (ref 12.0–15.0)
MCH: 27 pg (ref 26.0–34.0)
MCHC: 32.4 g/dL (ref 30.0–36.0)
MCV: 83.4 fL (ref 80.0–100.0)
Platelets: 308 10*3/uL (ref 150–400)
RBC: 3.37 MIL/uL — ABNORMAL LOW (ref 3.87–5.11)
RDW: 16.3 % — ABNORMAL HIGH (ref 11.5–15.5)
WBC: 19.2 10*3/uL — ABNORMAL HIGH (ref 4.0–10.5)
nRBC: 0 % (ref 0.0–0.2)

## 2019-11-08 LAB — BASIC METABOLIC PANEL
Anion gap: 8 (ref 5–15)
BUN: 11 mg/dL (ref 8–23)
CO2: 22 mmol/L (ref 22–32)
Calcium: 7.7 mg/dL — ABNORMAL LOW (ref 8.9–10.3)
Chloride: 103 mmol/L (ref 98–111)
Creatinine, Ser: 0.51 mg/dL (ref 0.44–1.00)
GFR calc Af Amer: >60 mL/min (ref >60)
GFR calc non Af Amer: >60 mL/min (ref >60)
Glucose, Bld: 162 mg/dL — ABNORMAL HIGH (ref 70–99)
Potassium: 3.3 mmol/L — ABNORMAL LOW (ref 3.5–5.1)
Sodium: 133 mmol/L — ABNORMAL LOW (ref 135–145)

## 2019-11-08 LAB — AEROBIC CULTURE W GRAM STAIN (SUPERFICIAL SPECIMEN)
Gram Stain: NONE SEEN
Gram Stain: NONE SEEN

## 2019-11-08 MED ORDER — ENSURE ENLIVE PO LIQD
237.0000 mL | Freq: Two times a day (BID) | ORAL | Status: DC
  Administered 2019-11-08 – 2019-11-12 (×9): 237 mL via ORAL

## 2019-11-08 MED ORDER — POTASSIUM CHLORIDE 20 MEQ PO PACK
40.0000 meq | PACK | Freq: Once | ORAL | Status: AC
  Administered 2019-11-08: 40 meq via ORAL
  Filled 2019-11-08: qty 2

## 2019-11-08 MED ORDER — ADULT MULTIVITAMIN W/MINERALS CH
1.0000 | ORAL_TABLET | Freq: Every day | ORAL | Status: DC
  Administered 2019-11-08 – 2019-11-12 (×5): 1 via ORAL
  Filled 2019-11-08 (×5): qty 1

## 2019-11-08 MED ORDER — ZINC SULFATE 220 (50 ZN) MG PO CAPS
220.0000 mg | ORAL_CAPSULE | Freq: Every day | ORAL | Status: DC
  Administered 2019-11-08 – 2019-11-12 (×5): 220 mg via ORAL
  Filled 2019-11-08 (×5): qty 1

## 2019-11-08 MED ORDER — ASCORBIC ACID 500 MG PO TABS
500.0000 mg | ORAL_TABLET | Freq: Two times a day (BID) | ORAL | Status: DC
  Administered 2019-11-08 – 2019-11-12 (×9): 500 mg via ORAL
  Filled 2019-11-08 (×9): qty 1

## 2019-11-08 MED ORDER — METOPROLOL SUCCINATE ER 50 MG PO TB24
50.0000 mg | ORAL_TABLET | Freq: Every day | ORAL | Status: DC
  Administered 2019-11-08 – 2019-11-12 (×5): 50 mg via ORAL
  Filled 2019-11-08 (×5): qty 1

## 2019-11-08 MED ORDER — PRO-STAT SUGAR FREE PO LIQD
30.0000 mL | Freq: Two times a day (BID) | ORAL | Status: DC
  Administered 2019-11-08 – 2019-11-12 (×8): 30 mL via ORAL
  Filled 2019-11-08 (×8): qty 30

## 2019-11-08 MED ORDER — JUVEN PO PACK
1.0000 | PACK | Freq: Two times a day (BID) | ORAL | Status: DC
  Administered 2019-11-08 – 2019-11-12 (×8): 1 via ORAL
  Filled 2019-11-08 (×9): qty 1

## 2019-11-08 NOTE — Progress Notes (Addendum)
   11/07/19 2053  Assess: MEWS Score  Temp 99.5 F (37.5 C)  BP (!) 166/81  Pulse Rate (!) 120  Resp 16  SpO2 100 %  O2 Device Room Air  Assess: MEWS Score  MEWS Temp 0  MEWS Systolic 0  MEWS Pulse 2  MEWS RR 0  MEWS LOC 0  MEWS Score 2  MEWS Score Color Yellow  Assess: if the MEWS score is Yellow or Red  Were vital signs taken at a resting state? Yes  Focused Assessment Documented focused assessment  Early Detection of Sepsis Score *See Row Information* Low  MEWS guidelines implemented *See Row Information* Yes  Treat  MEWS Interventions Administered prn meds/treatments (pain medication administered )  Take Vital Signs  Increase Vital Sign Frequency  Yellow: Q 2hr X 2 then Q 4hr X 2, if remains yellow, continue Q 4hrs  Escalate  MEWS: Escalate Yellow: discuss with charge nurse/RN and consider discussing with provider and RRT  Notify: Charge Nurse/RN  Name of Charge Nurse/RN Notified Vera RN   Date Charge Nurse/RN Notified 11/07/19  Time Charge Nurse/RN Notified 2112  Pt HR sustaining 120s. Pt assessed and in pain, RN attempting to manage pain.  Will implement YELLOW MEWS guidelines to follow more closely.

## 2019-11-08 NOTE — Progress Notes (Signed)
Initial Nutrition Assessment  DOCUMENTATION CODES:   Obesity unspecified  INTERVENTION:  - will order Ensure Enlive BID, each supplement provides 350 kcal and 20 grams of protein. - will order 30 mL Prostat BID, each supplement provides 100 kcal and 15 grams of protein. - will order 1 tablet multivitamin with minerals/day. - will order 500 mg ascorbic acid BID and 220 mg zinc sulfate/day (MD approved). - will order Juven BID, each packet provides 95 calories, 2.5 grams of protein (collagen), and 9.8 grams of carbohydrate (3 grams sugar); also contains 7 grams of L-arginine and L-glutamine, 300 mg vitamin C, 15 mg vitamin E, 1.2 mcg vitamin B-12, 9.5 mg zinc, 200 mg calcium, and 1.5 g  Calcium Beta-hydroxy-Beta-methylbutyrate to support wound healing.   NUTRITION DIAGNOSIS:   Increased nutrient needs related to acute illness, wound healing as evidenced by estimated needs.  GOAL:   Patient will meet greater than or equal to 90% of their needs  MONITOR:   PO intake, Supplement acceptance, Labs, Weight trends  REASON FOR ASSESSMENT:   Malnutrition Screening Tool  ASSESSMENT:   69 year old female with history of dementia, CAD, HTN, DVT , type 2 DM, and hyperlipidemia. She was sent from nursing home [facility]) due to AMS. She follows with Wound Care outpatient due to sacral, LLE, and R heel wounds. CT abdomen did not show any abscess or osteomyelitis to sacral wound. She was admitted for possible wound infection.  Patient ate 0% of breakfast, 25% of lunch, and 0% of dinner yesterday. Unable to reach patient by phone. Weight on 7/1 was 210 lb which appears to have been copied forward from 07/17/18. Will order for re-weighing today.   Per notes: - suspected sepsis 2/2 cellulitis of sacral/coccyx wound - hypotension on admission--resolved  - acute metabolic encephalopathy with hx of dementia--back to baseline (a/o x2) - normocytic anemia - plan for time of d/c is for return to  facility   Labs reviewed; CBG: 136 mg/dl, Na: 161 mmol/l, K: 3.3 mmol/l, Ca: 7.7 mg/dl. Medications reviewed; 100 mg colace/day, sliding scale novolog, 25 units lantus/day, 3 mg melatonin/day, 17 g miralax/day, 40 mEq Klor-Con x1 dose 7/4. IVF; NS @ 75 ml/hr.     NUTRITION - FOCUSED PHYSICAL EXAM:  unable to complete at this time.   Diet Order:   Diet Order            Diet Heart Room service appropriate? Yes; Fluid consistency: Thin  Diet effective now                 EDUCATION NEEDS:   No education needs have been identified at this time  Skin:  Skin Assessment: Skin Integrity Issues: Skin Integrity Issues:: Unstageable, Stage II, Stage III Stage II: L buttocks Stage III: coccyx Unstageable: full thickness to L ankle, foot, and pre-tibial areas  Last BM:  PTA/unknown  Height:   Ht Readings from Last 1 Encounters:  11/05/19 5\' 9"  (1.753 m)    Weight:   Wt Readings from Last 1 Encounters:  11/05/19 95.3 kg     Estimated Nutritional Needs:  Kcal:  2300-2500 kcal Protein:  115-130 grams Fluid:  >/= 2.3 L/day     01/06/20, MS, RD, LDN, CNSC Inpatient Clinical Dietitian RD pager # available in [facility]  After hours/weekend pager # available in [facility]

## 2019-11-08 NOTE — Progress Notes (Addendum)
PROGRESS NOTE    Alyssa Haley  ZOX:[id number] DOB: [birth year] DOA: 11/05/2019   PCP: Eloisa Northern, MD   Brief Narrative:  Patient is a 69 year old female with history of dementia, coronary artery disease, hypertension, DVT , type 2 diabetes mellitus, hyperlipidemia who was sent from nursing facility for the evaluation of altered mental status.  Nursing staff at [facility] health facility noticed change in her mental status and also increased pain medication.  She has sacral wound and also ulcers on the left lower extremity and right heel.  She follows with wound care as an outpatient .  On presentation she had mild grade fever, tachycardic, initially hypotensive with significant leukocytosis, lactate of 2.  CT abdomen showed right posterior sacral decubitus ulcer without abscess or evidence of osteomyelitis.  There was concern for cellulitis. She was admitted for management of possible infected decubitus ulcers. Altered mental status has improved, she is getting IV antibiotics for infection.   Assessment & Plan:   Principal Problem:   Sepsis (HCC) Active Problems:   Uncontrolled type 2 diabetes mellitus with hyperglycemia (HCC)   Hypotension   AMS (altered mental status)   Stage IV pressure ulcer of sacral region (HCC)   Wound of lower extremity, initial encounter   Transaminitis  Suspected sepsis: Secondary to cellulitis of the sacral decubitus wound.  Started on vancomycin, Rocephin.  Follow-up cultures. Wound culture showed mixed organisms. Continue current antibiotics.  Wound care consulted. She has severe leukocytosis which is trending down.  Monitor CBC.  Hypotension: Currently normotensive.  She responded to IV fluids.  Metoprolol was on hold, now resumed as BP spikes.  There was concern for sepsis. Continue gentle IV fluids, she looks dehydrated.  Extensive pressure ulcers: She has sacral decubitus ulcer, ulcer on the lateral lower extremity, right heel ulcer.  Continue wound  care.  Acute metabolic encephalopathy on the background of dementia: Currently she is oriented x 2.  Alert and awake and follows commands.  As per the daughter, she is more alert and oriented today..  Gabapentin and trazodone on hold. She is also on lot of pain meds which cud have contributed to increased lethargy/weakness/confusion.  Currently she is at her baseline mental status.  Normocytic anemia: Low iron.  We will give her a dose of IV iron.  Continue oral iron supplementation on discharge.  Mild transaminitis: Improved  Insulin-dependent diabetes type 2: On insulin at baseline.  Continue current insulin regimen.  History of DVT: Previously on Eliquis and Xarelto but not on any anticoagulation currently  Chronic pain syndrome: Continue pain medications,I have decreased the dosage.   DVT prophylaxis: Lovenox Code Status: Full Family Communication: Daughter on phone Status is: Inpatient  Remains inpatient appropriate because:IV treatments appropriate due to intensity of illness or inability to take PO   Dispo: The patient is from: SNF  Anticipated d/c is to: SNF  Anticipated d/c date is:1- 2 days  Patient currently is not medically stable to d/c.    Consultants:    None  Procedures:  None  Antimicrobials:  Anti-infectives (From admission, onward)   Start     Dose/Rate Route Frequency Ordered Stop   11/06/19 1800  cefTRIAXone (ROCEPHIN) 1 g in sodium chloride 0.9 % 100 mL IVPB     Discontinue     1 g 200 mL/hr over 30 Minutes Intravenous Every 24 hours 11/05/19 2010     11/06/19 1000  vancomycin (VANCOCIN) IVPB 1000 mg/200 mL premix     Discontinue  1,000 mg 200 mL/hr over 60 Minutes Intravenous Every 12 hours 11/05/19 2039     11/05/19 2045  vancomycin (VANCOREADY) IVPB 2000 mg/400 mL  Status:  Discontinued        2,000 mg 200 mL/hr over 120 Minutes Intravenous  Once 11/05/19 2030 11/05/19 2037   11/05/19 2045   vancomycin (VANCOCIN) IVPB 1000 mg/200 mL premix        1,000 mg 200 mL/hr over 60 Minutes Intravenous Every 1 hr x 2 11/05/19 2037 11/06/19 0127   11/05/19 2015  vancomycin (VANCOCIN) IVPB 1000 mg/200 mL premix  Status:  Discontinued        1,000 mg 200 mL/hr over 60 Minutes Intravenous  Once 11/05/19 2010 11/05/19 2030   11/05/19 1845  cefTRIAXone (ROCEPHIN) 2 g in sodium chloride 0.9 % 100 mL IVPB        2 g 200 mL/hr over 30 Minutes Intravenous  Once 11/05/19 1838 11/05/19 2014   11/05/19 1830  aztreonam (AZACTAM) 2 g in sodium chloride 0.9 % 100 mL IVPB  Status:  Discontinued        2 g 200 mL/hr over 30 Minutes Intravenous  Once 11/05/19 1825 11/05/19 1844   11/05/19 1800  vancomycin (VANCOREADY) IVPB 2000 mg/400 mL  Status:  Discontinued        2,000 mg 200 mL/hr over 120 Minutes Intravenous  Once 11/05/19 1757 11/05/19 1825      Subjective: Patient was seen and examined at bedside.  No overnight events.  Patient is hemodynamically stable, afebrile.   She is more alert and awake , oriented x 2, daughter was at bedside, all questions answered.   Objective: Vitals:   11/07/19 2358 11/08/19 0138 11/08/19 0417 11/08/19 1005  BP: (!) 150/78 (!) 167/91 (!) 157/84 (!) 153/68  Pulse: (!) 108 99 82 79  Resp: 18 18 18 12   Temp: 99.4 F (37.4 C) 99 F (37.2 C) 98 F (36.7 C) 98 F (36.7 C)  TempSrc: Axillary Axillary Oral Oral  SpO2: 100% 99% 100% 100%  Weight:      Height:        Intake/Output Summary (Last 24 hours) at 11/08/2019 1205 Last data filed at 11/08/2019 0551 Gross per 24 hour  Intake 1567.16 ml  Output 1050 ml  Net 517.16 ml   Filed Weights   11/05/19 1910  Weight: 95.3 kg    Examination:  General exam: Appears Deconditioned , debilitated and chronic looking. Respiratory system: Clear to auscultation. Respiratory effort normal. Cardiovascular system: S1 & S2 heard, RRR. No JVD, murmurs, rubs, gallops or clicks. No pedal edema. Gastrointestinal system:  Abdomen is nondistended, soft and nontender. No organomegaly or masses felt. Normal bowel sounds heard. Central nervous system: Alert and oriented. No focal neurological deficits. Extremities: No edema, no clubbing, no cyanosis,  Skin: Sacral ulcer, bilateral lower extremity ulcers   Data Reviewed: I have personally reviewed following labs and imaging studies  CBC: Recent Labs  Lab 11/05/19 1717 11/06/19 0414 11/07/19 0327 11/08/19 0410  WBC 19.9* 24.4* 20.6* 19.2*  NEUTROABS 16.3*  --  16.1*  --   HGB 10.4* 9.8* 8.9* 9.1*  HCT 33.3* 30.7* 27.9* 28.1*  MCV 84.5 84.8 82.8 83.4  PLT 371 350 266 308   Basic Metabolic Panel: Recent Labs  Lab 11/05/19 1717 11/06/19 0414 11/06/19 1500 11/08/19 0410  NA 140 141 135 133*  K 4.6 4.2 3.9 3.3*  CL 105 108 104 103  CO2 27 24 21* 22  GLUCOSE 326*  232* 264* 162*  BUN 42* 31* 25* 11  CREATININE 0.79 0.66 0.68 0.51  CALCIUM 8.6* 8.2* 8.0* 7.7*   GFR: Estimated Creatinine Clearance: 81.5 mL/min (by C-G formula based on SCr of 0.51 mg/dL). Liver Function Tests: Recent Labs  Lab 11/05/19 1717 11/06/19 0414  AST 46* 36  ALT 53* 47*  ALKPHOS 113 101  BILITOT 0.5 0.5  PROT 7.4 6.7  ALBUMIN 2.2* 2.1*   No results for input(s): LIPASE, AMYLASE in the last 168 hours. No results for input(s): AMMONIA in the last 168 hours. Coagulation Profile: Recent Labs  Lab 11/05/19 1900  INR 1.1   Cardiac Enzymes: No results for input(s): CKTOTAL, CKMB, CKMBINDEX, TROPONINI in the last 168 hours. BNP (last 3 results) No results for input(s): PROBNP in the last 8760 hours. HbA1C: Recent Labs    11/05/19 1714  HGBA1C 7.6*   CBG: Recent Labs  Lab 11/07/19 0733 11/07/19 1233 11/07/19 1632 11/07/19 2136 11/08/19 0746  GLUCAP 94 123* 144* 177* 136*   Lipid Profile: No results for input(s): CHOL, HDL, LDLCALC, TRIG, CHOLHDL, LDLDIRECT in the last 72 hours. Thyroid Function Tests: No results for input(s): TSH, T4TOTAL, FREET4,  T3FREE, THYROIDAB in the last 72 hours. Anemia Panel: Recent Labs    11/06/19 0414  VITAMINB12 1,032*  FOLATE 14.7  TIBC 140*  IRON 17*   Sepsis Labs: Recent Labs  Lab 11/05/19 1717 11/05/19 1900  LATICACIDVEN 2.0* 2.0*    Recent Results (from the past 240 hour(s))  Aerobic Culture (superficial specimen)     Status: None   Collection Time: 11/05/19  3:54 PM   Specimen: Wound  Result Value Ref Range Status   Specimen Description   Final    WOUND RIGHT SHIN Performed at [facility], [address], [place], [place] [address]    Special Requests   Final    NONE Performed at [facility], [address] [place], [place] [address]    Gram Stain   Final    NO WBC SEEN FEW GRAM POSITIVE COCCI IN PAIRS RARE GRAM NEGATIVE RODS Performed at [facility], [address], [place], [place] [address]    Culture   Final    FEW PROTEUS MIRABILIS FEW ESCHERICHIA COLI FEW STAPHYLOCOCCUS AUREUS    Report Status 11/08/2019 FINAL  Final   Organism ID, Bacteria PROTEUS MIRABILIS  Final   Organism ID, Bacteria ESCHERICHIA COLI  Final   Organism ID, Bacteria STAPHYLOCOCCUS AUREUS  Final      Susceptibility   Escherichia coli - MIC*    AMPICILLIN >=32 RESISTANT Resistant     CEFAZOLIN <=4 SENSITIVE Sensitive     CEFEPIME <=0.12 SENSITIVE Sensitive     CEFTAZIDIME <=1 SENSITIVE Sensitive     CEFTRIAXONE <=0.25 SENSITIVE Sensitive     CIPROFLOXACIN <=0.25 SENSITIVE Sensitive     GENTAMICIN <=1 SENSITIVE Sensitive     IMIPENEM <=0.25 SENSITIVE Sensitive     TRIMETH/SULFA <=20 SENSITIVE Sensitive     AMPICILLIN/SULBACTAM 4 SENSITIVE Sensitive     PIP/TAZO <=4 SENSITIVE Sensitive     * FEW ESCHERICHIA COLI   Proteus mirabilis - MIC*    AMPICILLIN <=2 SENSITIVE Sensitive     CEFAZOLIN <=4 SENSITIVE Sensitive     CEFEPIME <=0.12 SENSITIVE Sensitive     CEFTAZIDIME <=1 SENSITIVE Sensitive     CEFTRIAXONE <=0.25 SENSITIVE Sensitive      CIPROFLOXACIN <=0.25 SENSITIVE Sensitive     GENTAMICIN <=1 SENSITIVE Sensitive  IMIPENEM 2 SENSITIVE Sensitive     TRIMETH/SULFA <=20 SENSITIVE Sensitive     AMPICILLIN/SULBACTAM <=2 SENSITIVE Sensitive     PIP/TAZO <=4 SENSITIVE Sensitive     * FEW PROTEUS MIRABILIS   Staphylococcus aureus - MIC*    CIPROFLOXACIN <=0.5 SENSITIVE Sensitive     ERYTHROMYCIN <=0.25 SENSITIVE Sensitive     GENTAMICIN <=0.5 SENSITIVE Sensitive     OXACILLIN 0.5 SENSITIVE Sensitive     TETRACYCLINE <=1 SENSITIVE Sensitive     VANCOMYCIN <=0.5 SENSITIVE Sensitive     TRIMETH/SULFA <=10 SENSITIVE Sensitive     CLINDAMYCIN <=0.25 SENSITIVE Sensitive     RIFAMPIN <=0.5 SENSITIVE Sensitive     Inducible Clindamycin NEGATIVE Sensitive     * FEW STAPHYLOCOCCUS AUREUS  Aerobic Culture (superficial specimen)     Status: Abnormal   Collection Time: 11/05/19  3:54 PM   Specimen: Wound  Result Value Ref Range Status   Specimen Description   Final    WOUND SACRAL Performed at [facility], [address] [place], [place] [address]    Special Requests   Final    NONE Performed at [facility], [address] [place], [place] [address]    Gram Stain   Final    NO WBC SEEN FEW GRAM POSITIVE COCCI IN PAIRS FEW GRAM NEGATIVE RODS    Culture (A)  Final    MULTIPLE ORGANISMS PRESENT, NONE PREDOMINANT NO STAPHYLOCOCCUS AUREUS ISOLATED NO GROUP A STREP (S.PYOGENES) ISOLATED Performed at [facility], [address] [place], [place] [address]    Report Status 11/08/2019 FINAL  Final  Blood culture (routine x 2)     Status: None (Preliminary result)   Collection Time: 11/05/19  6:00 PM   Specimen: BLOOD  Result Value Ref Range Status   Specimen Description   Final    BLOOD RIGHT WRIST Performed at [facility], [address] [place], [place] [address]    Special Requests   Final    BOTTLES DRAWN AEROBIC AND ANAEROBIC Blood Culture  adequate volume Performed at [facility], [address] [place], [place] [address]    Culture   Final    NO GROWTH 2 DAYS Performed at [facility], [address] [place], [place] [address]    Report Status PENDING  Incomplete  SARS Coronavirus 2 by RT PCR (hospital order, performed in [facility] hospital lab) Nasopharyngeal Nasopharyngeal Swab     Status: None   Collection Time: 11/05/19  9:00 PM   Specimen: Nasopharyngeal Swab  Result Value Ref Range Status   SARS Coronavirus 2 NEGATIVE NEGATIVE Final    Comment: (NOTE) SARS-CoV-2 target nucleic acids are NOT DETECTED.  The SARS-CoV-2 RNA is generally detectable in upper and lower respiratory specimens during the acute phase of infection. The lowest concentration of SARS-CoV-2 viral copies this assay can detect is 250 copies / mL. A negative result does not preclude SARS-CoV-2 infection and should not be used as the sole basis for treatment or other patient management decisions.  A negative result may occur with improper specimen collection / handling, submission of specimen other than nasopharyngeal swab, presence of viral mutation(s) within the areas targeted by this assay, and inadequate number of viral copies (<250 copies / mL). A negative result must be combined with clinical observations, patient history, and epidemiological information.  Fact Sheet for Patients:   BoilerBrush.com.cyhttps://www.fda.gov/media/136312/download  Fact Sheet for Healthcare Providers: https://pope.com/https://www.fda.gov/media/136313/download  This test  is not yet approved or  cleared by the [place] and has been authorized for detection and/or diagnosis of SARS-CoV-2 by FDA under an Emergency Use Authorization (EUA).  This EUA will remain in effect (meaning this test can be used) for the duration of the COVID-19 declaration under Section 564(b)(1) of the Act, 21 U.S.C. section 360bbb-3(b)(1), unless the authorization is terminated  or revoked sooner.  Performed at [facility], [address], [place], [place] [address]   Urine culture     Status: Abnormal   Collection Time: 11/06/19  8:34 AM   Specimen: Urine, Random  Result Value Ref Range Status   Specimen Description   Final    URINE, RANDOM Performed at [facility], [address] [place], [place] [address]    Special Requests   Final    NONE Performed at [facility], [address] [place], [place] [address]    Culture MULTIPLE SPECIES PRESENT, SUGGEST RECOLLECTION (A)  Final   Report Status 11/07/2019 FINAL  Final  MRSA PCR Screening     Status: None   Collection Time: 11/06/19  4:24 PM   Specimen: Nasopharyngeal  Result Value Ref Range Status   MRSA by PCR NEGATIVE NEGATIVE Final    Comment:        The GeneXpert MRSA Assay (FDA approved for NASAL specimens only), is one component of a comprehensive MRSA colonization surveillance program. It is not intended to diagnose MRSA infection nor to guide or monitor treatment for MRSA infections. Performed at [facility], [address] [place], [place] [address]     Radiology Studies: No results found.  Scheduled Meds: . allopurinol  100 mg Oral BID  . atorvastatin  80 mg Oral QHS  . collagenase   Topical Daily  . docusate sodium  100 mg Oral QHS  . enoxaparin (LOVENOX) injection  40 mg Subcutaneous Q24H  . insulin aspart  0-5 Units Subcutaneous QHS  . insulin aspart  0-9 Units Subcutaneous TID WC  . insulin glargine  25 Units Subcutaneous Daily  . melatonin  3 mg Oral QHS  . metoprolol succinate  50 mg Oral Daily  . pantoprazole  40 mg Oral Daily  . polyethylene glycol  17 g Oral Daily  . sertraline  50 mg Oral Daily   Continuous Infusions: . sodium chloride 75 mL/hr at 11/06/19 2048  . cefTRIAXone (ROCEPHIN)  IV 1 g (11/07/19 1819)  . vancomycin 1,000 mg (11/08/19 1042)     LOS: 3 days    Time  spent: 35 mins.  Cipriano Bunker, MD [facility]   If 7PM-7AM, please contact night-coverage

## 2019-11-08 NOTE — Progress Notes (Signed)
This Clinical research associate found a new skin tear to patients' right buttocks next to the existing sacral wound during routine wound care. The skin tear was cleansed with NS, pat dry and applied a foam dressing. All wound care was cleansed and changed at this time. Dr. Lucianne Muss was updated via txt. Patients' daughter was called to be made aware. This Clinical research associate will continue to monitor.

## 2019-11-09 ENCOUNTER — Inpatient Hospital Stay (HOSPITAL_COMMUNITY): Payer: Medicare Other

## 2019-11-09 DIAGNOSIS — R4182 Altered mental status, unspecified: Secondary | ICD-10-CM

## 2019-11-09 DIAGNOSIS — F0390 Unspecified dementia without behavioral disturbance: Secondary | ICD-10-CM

## 2019-11-09 LAB — COMPREHENSIVE METABOLIC PANEL
ALT: 30 U/L (ref 0–44)
AST: 22 U/L (ref 15–41)
Albumin: 2 g/dL — ABNORMAL LOW (ref 3.5–5.0)
Alkaline Phosphatase: 102 U/L (ref 38–126)
Anion gap: 8 (ref 5–15)
BUN: 9 mg/dL (ref 8–23)
CO2: 20 mmol/L — ABNORMAL LOW (ref 22–32)
Calcium: 7.6 mg/dL — ABNORMAL LOW (ref 8.9–10.3)
Chloride: 100 mmol/L (ref 98–111)
Creatinine, Ser: 0.47 mg/dL (ref 0.44–1.00)
GFR calc Af Amer: >60 mL/min (ref >60)
GFR calc non Af Amer: >60 mL/min (ref >60)
Glucose, Bld: 115 mg/dL — ABNORMAL HIGH (ref 70–99)
Potassium: 3.4 mmol/L — ABNORMAL LOW (ref 3.5–5.1)
Sodium: 128 mmol/L — ABNORMAL LOW (ref 135–145)
Total Bilirubin: 0.6 mg/dL (ref 0.3–1.2)
Total Protein: 5.8 g/dL — ABNORMAL LOW (ref 6.5–8.1)

## 2019-11-09 LAB — GLUCOSE, CAPILLARY
Glucose-Capillary: 103 mg/dL — ABNORMAL HIGH (ref 70–99)
Glucose-Capillary: 185 mg/dL — ABNORMAL HIGH (ref 70–99)
Glucose-Capillary: 168 mg/dL — ABNORMAL HIGH (ref 70–99)
Glucose-Capillary: 114 mg/dL — ABNORMAL HIGH (ref 70–99)

## 2019-11-09 LAB — CBC
HCT: 30.1 % — ABNORMAL LOW (ref 36.0–46.0)
Hemoglobin: 9.6 g/dL — ABNORMAL LOW (ref 12.0–15.0)
MCH: 26.2 pg (ref 26.0–34.0)
MCHC: 31.9 g/dL (ref 30.0–36.0)
MCV: 82 fL (ref 80.0–100.0)
Platelets: 354 10*3/uL (ref 150–400)
RBC: 3.67 MIL/uL — ABNORMAL LOW (ref 3.87–5.11)
RDW: 16.4 % — ABNORMAL HIGH (ref 11.5–15.5)
WBC: 24.3 10*3/uL — ABNORMAL HIGH (ref 4.0–10.5)
nRBC: 0 % (ref 0.0–0.2)

## 2019-11-09 LAB — URINE CULTURE: Culture: <10000 — AB

## 2019-11-09 LAB — MAGNESIUM: Magnesium: 1.2 mg/dL — ABNORMAL LOW (ref 1.7–2.4)

## 2019-11-09 LAB — PHOSPHORUS: Phosphorus: 1.6 mg/dL — ABNORMAL LOW (ref 2.5–4.6)

## 2019-11-09 MED ORDER — POTASSIUM CHLORIDE 20 MEQ PO PACK: 40.0000 meq | PACK | Freq: Two times a day (BID) | ORAL | Status: DC

## 2019-11-09 MED ORDER — POTASSIUM PHOSPHATES 15 MMOLE/5ML IV SOLN
30.0000 mmol | Freq: Once | INTRAVENOUS | Status: AC
  Administered 2019-11-09: 30 mmol via INTRAVENOUS
  Filled 2019-11-09: qty 10

## 2019-11-09 MED ORDER — SODIUM CHLORIDE 0.9 % IV SOLN
2.0000 g | INTRAVENOUS | Status: DC
  Administered 2019-11-09 – 2019-11-10 (×2): 2 g via INTRAVENOUS
  Filled 2019-11-09: qty 20
  Filled 2019-11-09: qty 2
  Filled 2019-11-09: qty 20
  Filled 2019-11-09: qty 2

## 2019-11-09 MED ORDER — MAGNESIUM SULFATE 2 GM/50ML IV SOLN
2.0000 g | Freq: Once | INTRAVENOUS | Status: AC
  Administered 2019-11-09: 2 g via INTRAVENOUS
  Filled 2019-11-09: qty 50

## 2019-11-09 MED ORDER — METRONIDAZOLE 500 MG PO TABS
500.0000 mg | ORAL_TABLET | Freq: Three times a day (TID) | ORAL | Status: DC
  Administered 2019-11-09 – 2019-11-12 (×9): 500 mg via ORAL
  Filled 2019-11-09 (×9): qty 1

## 2019-11-09 NOTE — Progress Notes (Signed)
PROGRESS NOTE    Alyssa HeritageGeraldine Haley  ZOX:[id number] DOB: Jan 19, 1951 DOA: 11/05/2019   PCP: Eloisa NorthernAmin, Saad, MD   Brief Narrative:  Patient is a 69 year old female with history of dementia, coronary artery disease, hypertension, DVT , type 2 diabetes mellitus, hyperlipidemia who was sent from nursing facility for the evaluation of altered mental status.  [facility] noticed change in her mental status and also increased pain medication.  She has sacral wound and also ulcers on the left lower extremity and right heel.  She follows with wound care as an outpatient .  On presentation she had mild grade fever, tachycardic, initially hypotensive with significant leukocytosis, lactate of 2.  CT abdomen showed right posterior sacral decubitus ulcer without abscess or evidence of osteomyelitis.  There was concern for cellulitis. She was admitted for management of possible infected decubitus ulcers. Altered mental status has improved, she is getting IV antibiotics for infection.  Infectious disease consulted suggested consider palliative care and orthopedics consult.   Assessment & Plan:   Principal Problem:   Sepsis (HCC) Active Problems:   Uncontrolled type 2 diabetes mellitus with hyperglycemia (HCC)   Hypotension   AMS (altered mental status)   Stage IV pressure ulcer of sacral region (HCC)   Wound of lower extremity, initial encounter   Transaminitis  Suspected sepsis: Secondary to cellulitis of the sacral decubitus wound.  Started on vancomycin, Rocephin.  Follow-up cultures. Wound culture showed mixed organisms. Continue current antibiotics.  Wound care consulted. She has severe leukocytosis which remains elevated.  Monitor CBC.  Infectious disease consulted, suggested continue ceftriaxone and add Flagyl.  Recommended palliative care consult to discuss goals of care.  Also recommended Ortho consult.  Hypotension: Currently normotensive.  She responded to IV fluids.   Metoprolol was on hold, now resumed as BP spikes.  There was concern for sepsis. Continue gentle IV fluids, she looks dehydrated.  Extensive pressure ulcers: She has sacral decubitus ulcer, ulcer on the lateral lower extremity, right heel ulcer.  Continue wound care.  Acute metabolic encephalopathy on the background of dementia: Currently she is oriented x 2.  Alert and awake and follows commands.  As per the daughter, she is more alert and oriented today..  Gabapentin and trazodone on hold. She is also on lot of pain meds which cud have contributed to increased lethargy/weakness/confusion.  Currently she is at her baseline mental status.  Normocytic anemia: Low iron.  We will give her a dose of IV iron.  Continue oral iron supplementation on discharge.  Mild transaminitis: Improved  Insulin-dependent diabetes type 2: On insulin at baseline.  Continue current insulin regimen.  History of DVT: Previously on Eliquis and Xarelto but not on any anticoagulation currently  Chronic pain syndrome: Continue pain medications.   DVT prophylaxis: Lovenox Code Status: Full Family Communication: Daughter at bedside Status is: Inpatient  Remains inpatient appropriate because:IV treatments appropriate due to intensity of illness or inability to take PO   Dispo: The patient is from: SNF  Anticipated d/c is to: SNF  Anticipated d/c date is:1- 2 days  Patient currently is not medically stable to d/c.    Consultants:    None  Procedures:  None  Antimicrobials:  Anti-infectives (From admission, onward)   Start     Dose/Rate Route Frequency Ordered Stop   11/09/19 1800  cefTRIAXone (ROCEPHIN) 2 g in sodium chloride 0.9 % 100 mL IVPB     Discontinue     2 g 200 mL/hr over  30 Minutes Intravenous Every 24 hours 11/09/19 1342     11/09/19 1400  metroNIDAZOLE (FLAGYL) tablet 500 mg     Discontinue     500 mg Oral Every 8 hours 11/09/19 1344      11/06/19 1800  cefTRIAXone (ROCEPHIN) 1 g in sodium chloride 0.9 % 100 mL IVPB  Status:  Discontinued        1 g 200 mL/hr over 30 Minutes Intravenous Every 24 hours 11/05/19 2010 11/09/19 1342   11/06/19 1000  vancomycin (VANCOCIN) IVPB 1000 mg/200 mL premix  Status:  Discontinued        1,000 mg 200 mL/hr over 60 Minutes Intravenous Every 12 hours 11/05/19 2039 11/08/19 1338   11/05/19 2045  vancomycin (VANCOREADY) IVPB 2000 mg/400 mL  Status:  Discontinued        2,000 mg 200 mL/hr over 120 Minutes Intravenous  Once 11/05/19 2030 11/05/19 2037   11/05/19 2045  vancomycin (VANCOCIN) IVPB 1000 mg/200 mL premix        1,000 mg 200 mL/hr over 60 Minutes Intravenous Every 1 hr x 2 11/05/19 2037 11/06/19 0127   11/05/19 2015  vancomycin (VANCOCIN) IVPB 1000 mg/200 mL premix  Status:  Discontinued        1,000 mg 200 mL/hr over 60 Minutes Intravenous  Once 11/05/19 2010 11/05/19 2030   11/05/19 1845  cefTRIAXone (ROCEPHIN) 2 g in sodium chloride 0.9 % 100 mL IVPB        2 g 200 mL/hr over 30 Minutes Intravenous  Once 11/05/19 1838 11/05/19 2014   11/05/19 1830  aztreonam (AZACTAM) 2 g in sodium chloride 0.9 % 100 mL IVPB  Status:  Discontinued        2 g 200 mL/hr over 30 Minutes Intravenous  Once 11/05/19 1825 11/05/19 1844   11/05/19 1800  vancomycin (VANCOREADY) IVPB 2000 mg/400 mL  Status:  Discontinued        2,000 mg 200 mL/hr over 120 Minutes Intravenous  Once 11/05/19 1757 11/05/19 1825      Subjective: Patient was seen and examined at bedside.  No overnight events.  Patient is hemodynamically stable, afebrile.   She is more alert and awake , oriented x 2, daughter was at bedside, all questions answered.  Patient continued to cry in pain even with minimal movements.  Objective: Vitals:   11/08/19 2031 11/09/19 0549 11/09/19 0550 11/09/19 1414  BP: (!) 154/92 (!) 145/74 (!) 145/74 (!) 150/79  Pulse: 89 78 78 96  Resp: 20 14 14 18   Temp: 99.5 F (37.5 C) 98.6 F (37 C) 98.6  F (37 C) 98.6 F (37 C)  TempSrc: Oral Oral Oral Oral  SpO2: 100% 100% 100% 100%  Weight:      Height:        Intake/Output Summary (Last 24 hours) at 11/09/2019 1551 Last data filed at 11/09/2019 1230 Gross per 24 hour  Intake 1467 ml  Output --  Net 1467 ml   Filed Weights   11/05/19 1910 11/08/19 1808  Weight: 95.3 kg 79.2 kg    Examination:  General exam: Appears Deconditioned , debilitated and chronic looking. Respiratory system: Clear to auscultation. Respiratory effort normal. Cardiovascular system: S1 & S2 heard, RRR. No JVD, murmurs, rubs, gallops or clicks. No pedal edema. Gastrointestinal system: Abdomen is nondistended, soft and nontender. No organomegaly or masses felt. Normal bowel sounds heard. Central nervous system: Alert and oriented. No focal neurological deficits. Extremities: No edema, no clubbing, no cyanosis,  Skin:  Sacral ulcer, bilateral lower extremity ulcers   Data Reviewed: I have personally reviewed following labs and imaging studies  CBC: Recent Labs  Lab 11/05/19 1717 11/06/19 0414 11/07/19 0327 11/08/19 0410 11/09/19 0425  WBC 19.9* 24.4* 20.6* 19.2* 24.3*  NEUTROABS 16.3*  --  16.1*  --   --   HGB 10.4* 9.8* 8.9* 9.1* 9.6*  HCT 33.3* 30.7* 27.9* 28.1* 30.1*  MCV 84.5 84.8 82.8 83.4 82.0  PLT 371 350 266 308 354   Basic Metabolic Panel: Recent Labs  Lab 11/05/19 1717 11/06/19 0414 11/06/19 1500 11/08/19 0410 11/09/19 0425  NA 140 141 135 133* 128*  K 4.6 4.2 3.9 3.3* 3.4*  CL 105 108 104 103 100  CO2 27 24 21* 22 20*  GLUCOSE 326* 232* 264* 162* 115*  BUN 42* 31* 25* 11 9  CREATININE 0.79 0.66 0.68 0.51 0.47  CALCIUM 8.6* 8.2* 8.0* 7.7* 7.6*  MG  --   --   --   --  1.2*  PHOS  --   --   --   --  1.6*   GFR: Estimated Creatinine Clearance: 69.4 mL/min (by C-G formula based on SCr of 0.47 mg/dL). Liver Function Tests: Recent Labs  Lab 11/05/19 1717 11/06/19 0414 11/09/19 0425  AST 46* 36 22  ALT 53* 47* 30   ALKPHOS 113 101 102  BILITOT 0.5 0.5 0.6  PROT 7.4 6.7 5.8*  ALBUMIN 2.2* 2.1* 2.0*   No results for input(s): LIPASE, AMYLASE in the last 168 hours. No results for input(s): AMMONIA in the last 168 hours. Coagulation Profile: Recent Labs  Lab 11/05/19 1900  INR 1.1   Cardiac Enzymes: No results for input(s): CKTOTAL, CKMB, CKMBINDEX, TROPONINI in the last 168 hours. BNP (last 3 results) No results for input(s): PROBNP in the last 8760 hours. HbA1C: No results for input(s): HGBA1C in the last 72 hours. CBG: Recent Labs  Lab 11/08/19 1355 11/08/19 1703 11/08/19 2149 11/09/19 0758 11/09/19 1142  GLUCAP 175* 211* 173* 103* 185*   Lipid Profile: No results for input(s): CHOL, HDL, LDLCALC, TRIG, CHOLHDL, LDLDIRECT in the last 72 hours. Thyroid Function Tests: No results for input(s): TSH, T4TOTAL, FREET4, T3FREE, THYROIDAB in the last 72 hours. Anemia Panel: No results for input(s): VITAMINB12, FOLATE, FERRITIN, TIBC, IRON, RETICCTPCT in the last 72 hours. Sepsis Labs: Recent Labs  Lab 11/05/19 1717 11/05/19 1900  LATICACIDVEN 2.0* 2.0*    Recent Results (from the past 240 hour(s))  Aerobic Culture (superficial specimen)     Status: None   Collection Time: 11/05/19  3:54 PM   Specimen: Wound  Result Value Ref Range Status   Specimen Description   Final    WOUND RIGHT SHIN Performed at [facility], [address] [place], [place] [address]    Special Requests   Final    NONE Performed at [facility], [address], [place], [place] [address]    Gram Stain   Final    NO WBC SEEN FEW GRAM POSITIVE COCCI IN PAIRS RARE GRAM NEGATIVE RODS Performed at [facility], [address], [place], [place] [address]    Culture   Final    FEW PROTEUS MIRABILIS FEW ESCHERICHIA COLI FEW STAPHYLOCOCCUS AUREUS    Report Status 11/08/2019 FINAL  Final   Organism ID, Bacteria PROTEUS MIRABILIS  Final   Organism ID, Bacteria  ESCHERICHIA COLI  Final   Organism ID, Bacteria STAPHYLOCOCCUS AUREUS  Final  Susceptibility   Escherichia coli - MIC*    AMPICILLIN >=32 RESISTANT Resistant     CEFAZOLIN <=4 SENSITIVE Sensitive     CEFEPIME <=0.12 SENSITIVE Sensitive     CEFTAZIDIME <=1 SENSITIVE Sensitive     CEFTRIAXONE <=0.25 SENSITIVE Sensitive     CIPROFLOXACIN <=0.25 SENSITIVE Sensitive     GENTAMICIN <=1 SENSITIVE Sensitive     IMIPENEM <=0.25 SENSITIVE Sensitive     TRIMETH/SULFA <=20 SENSITIVE Sensitive     AMPICILLIN/SULBACTAM 4 SENSITIVE Sensitive     PIP/TAZO <=4 SENSITIVE Sensitive     * FEW ESCHERICHIA COLI   Proteus mirabilis - MIC*    AMPICILLIN <=2 SENSITIVE Sensitive     CEFAZOLIN <=4 SENSITIVE Sensitive     CEFEPIME <=0.12 SENSITIVE Sensitive     CEFTAZIDIME <=1 SENSITIVE Sensitive     CEFTRIAXONE <=0.25 SENSITIVE Sensitive     CIPROFLOXACIN <=0.25 SENSITIVE Sensitive     GENTAMICIN <=1 SENSITIVE Sensitive     IMIPENEM 2 SENSITIVE Sensitive     TRIMETH/SULFA <=20 SENSITIVE Sensitive     AMPICILLIN/SULBACTAM <=2 SENSITIVE Sensitive     PIP/TAZO <=4 SENSITIVE Sensitive     * FEW PROTEUS MIRABILIS   Staphylococcus aureus - MIC*    CIPROFLOXACIN <=0.5 SENSITIVE Sensitive     ERYTHROMYCIN <=0.25 SENSITIVE Sensitive     GENTAMICIN <=0.5 SENSITIVE Sensitive     OXACILLIN 0.5 SENSITIVE Sensitive     TETRACYCLINE <=1 SENSITIVE Sensitive     VANCOMYCIN <=0.5 SENSITIVE Sensitive     TRIMETH/SULFA <=10 SENSITIVE Sensitive     CLINDAMYCIN <=0.25 SENSITIVE Sensitive     RIFAMPIN <=0.5 SENSITIVE Sensitive     Inducible Clindamycin NEGATIVE Sensitive     * FEW STAPHYLOCOCCUS AUREUS  Aerobic Culture (superficial specimen)     Status: Abnormal   Collection Time: 11/05/19  3:54 PM   Specimen: Wound  Result Value Ref Range Status   Specimen Description   Final    WOUND SACRAL Performed at [facility], [address] [place], [place] [address]    Special Requests   Final     NONE Performed at [facility], [address] [place], [place] [address]    Gram Stain   Final    NO WBC SEEN FEW GRAM POSITIVE COCCI IN PAIRS FEW GRAM NEGATIVE RODS    Culture (A)  Final    MULTIPLE ORGANISMS PRESENT, NONE PREDOMINANT NO STAPHYLOCOCCUS AUREUS ISOLATED NO GROUP A STREP (S.PYOGENES) ISOLATED Performed at [facility], [address] [place], [place] [address]    Report Status 11/08/2019 FINAL  Final  Blood culture (routine x 2)     Status: None (Preliminary result)   Collection Time: 11/05/19  6:00 PM   Specimen: BLOOD  Result Value Ref Range Status   Specimen Description   Final    BLOOD RIGHT WRIST Performed at [facility], [address] [place], [place] [address]    Special Requests   Final    BOTTLES DRAWN AEROBIC AND ANAEROBIC Blood Culture adequate volume Performed at [facility], [address] [place], [place] [address]    Culture   Final    NO GROWTH 4 DAYS Performed at [facility], [address], [place], [place] [address]    Report Status PENDING  Incomplete  SARS Coronavirus 2 by RT PCR (hospital order, performed in [facility] hospital lab) Nasopharyngeal Nasopharyngeal Swab     Status: None   Collection  Time: 11/05/19  9:00 PM   Specimen: Nasopharyngeal Swab  Result Value Ref Range Status   SARS Coronavirus 2 NEGATIVE NEGATIVE Final    Comment: (NOTE) SARS-CoV-2 target nucleic acids are NOT DETECTED.  The SARS-CoV-2 RNA is generally detectable in upper and lower respiratory specimens during the acute phase of infection. The lowest concentration of SARS-CoV-2 viral copies this assay can detect is 250 copies / mL. A negative result does not preclude SARS-CoV-2 infection and should not be used as the sole basis for treatment or other patient management decisions.  A negative result may occur with improper specimen collection / handling, submission of specimen  other than nasopharyngeal swab, presence of viral mutation(s) within the areas targeted by this assay, and inadequate number of viral copies (<250 copies / mL). A negative result must be combined with clinical observations, patient history, and epidemiological information.  Fact Sheet for Patients:   BoilerBrush.com.cy  Fact Sheet for Healthcare Providers: https://pope.com/  This test is not yet approved or  cleared by the [place] FDA and has been authorized for detection and/or diagnosis of SARS-CoV-2 by FDA under an Emergency Use Authorization (EUA).  This EUA will remain in effect (meaning this test can be used) for the duration of the COVID-19 declaration under Section 564(b)(1) of the Act, 21 U.S.C. section 360bbb-3(b)(1), unless the authorization is terminated or revoked sooner.  Performed at [facility], [address] [place], [place] [address]   Urine culture     Status: Abnormal   Collection Time: 11/06/19  8:34 AM   Specimen: Urine, Random  Result Value Ref Range Status   Specimen Description   Final    URINE, RANDOM Performed at [facility], [address], [place], [place] [address]    Special Requests   Final    NONE Performed at [facility], [address], [place], [place] [address]    Culture MULTIPLE SPECIES PRESENT, SUGGEST RECOLLECTION (A)  Final   Report Status 11/07/2019 FINAL  Final  MRSA PCR Screening     Status: None   Collection Time: 11/06/19  4:24 PM   Specimen: Nasopharyngeal  Result Value Ref Range Status   MRSA by PCR NEGATIVE NEGATIVE Final    Comment:        The GeneXpert MRSA Assay (FDA approved for NASAL specimens only), is one component of a comprehensive MRSA colonization surveillance program. It is not intended to diagnose MRSA infection nor to guide or monitor treatment for MRSA infections. Performed at [facility], [address] [place], [place] [address]   Culture, Urine     Status: Abnormal   Collection Time: 11/08/19 12:30 PM   Specimen: Urine, Clean Catch  Result Value Ref Range Status   Specimen Description   Final    URINE, CLEAN CATCH Performed at [facility], [address], [place], [place] [address]    Special Requests   Final    NONE Performed at [facility], [address], [place], [place] [address]    Culture (A)  Final    <10,000 COLONIES/mL INSIGNIFICANT GROWTH Performed at [facility], [address], [place], [place] [address]    Report Status 11/09/2019 FINAL  Final    Radiology Studies: No results found.  Scheduled Meds: . allopurinol  100 mg Oral BID  . vitamin C  500 mg Oral BID  . atorvastatin  80 mg Oral QHS  .  collagenase   Topical Daily  . docusate sodium  100 mg Oral QHS  . enoxaparin (LOVENOX) injection  40 mg Subcutaneous Q24H  . feeding supplement (ENSURE ENLIVE)  237 mL Oral BID BM  . feeding supplement (PRO-STAT SUGAR FREE 64)  30 mL Oral BID  . insulin aspart  0-5 Units Subcutaneous QHS  . insulin aspart  0-9 Units Subcutaneous TID WC  . insulin glargine  25 Units Subcutaneous Daily  . melatonin  3 mg Oral QHS  . metoprolol succinate  50 mg Oral Daily  . metroNIDAZOLE  500 mg Oral Q8H  . multivitamin with minerals  1 tablet Oral Daily  . nutrition supplement (JUVEN)  1 packet Oral BID BM  . pantoprazole  40 mg Oral Daily  . polyethylene glycol  17 g Oral Daily  . sertraline  50 mg Oral Daily  . zinc sulfate  220 mg Oral Daily   Continuous Infusions: . sodium chloride 75 mL/hr at 11/09/19 0648  . cefTRIAXone (ROCEPHIN)  IV    . potassium PHOSPHATE IVPB (in mmol) 30 mmol (11/09/19 1048)     LOS: 4 days    Time spent: 35 mins.  Cipriano Bunker, MD [facility]   If 7PM-7AM, please contact night-coverage

## 2019-11-09 NOTE — Care Management Important Message (Signed)
Important Message  Patient Details IM Letter given to Sharol Roussel RN Case Manager to present to the Patient Name: Alyssa Haley MRN: [id number] Date of Birth: 01/13/1951   Medicare Important Message Given:        Caren Macadam 11/09/2019, 10:27 AM

## 2019-11-09 NOTE — Consult Note (Addendum)
WOC consult was performed on 7/2; refer to previous consult note. Requested to reassess wounds. Pt has multiple systemic factors which can impair healing. She is frequently incontinent and it is difficult to keep sacrum wound from becoming soiled.  Pt recently developed a partial thickness skin tear to buttocks near sacrum wound related to shear; foam dressing to protect and promote healing; red and moist.  Wound appearance is unchanged from previous progress note. Agree with plan of care to left leg and sacrum Unstageable wounds using Santyl for enzymatic debridement to assist with removal of nonviable tissue. Pt screams when either location is assessed and dressings are changed.  Sacrum wound with large amt slough/eschar and mod amt tan drainage with strong odor; patient would not be able to tolerate hydrotherapy related to pain. CT scan did not indicate osteomyelitis. Continue present plan of care as in the previous consult notes.  Please re-consult if further assistance is needed.  Thank-you,  Cammie Mcgee MSN, RN, CWOCN, [place], CNS [phone]

## 2019-11-09 NOTE — Consult Note (Signed)
Date of Admission:  11/05/2019          Reason for Consult: Elevated WBC   Referring Provider: Dr. Lucianne Muss   Assessment:  1. Advanced dementia 2. Multiple painful deep decubitus ulcers and possible underlying osteomyelitis 3. Sacral fracture 4. Admission with delirium and SIRS  Plan:  1. Would consult palliative care 2. I am adding flagyl to her ceftriaxone 3. She does not have microbiological evidence for need for vancomycin 4. I have ordered plain films of left ankle  Dr. Drue Second will take over the service tomorrow.  Principal Problem:   Sepsis (HCC) Active Problems:   Uncontrolled type 2 diabetes mellitus with hyperglycemia (HCC)   Hypotension   AMS (altered mental status)   Stage IV pressure ulcer of sacral region (HCC)   Wound of lower extremity, initial encounter   Transaminitis   Scheduled Meds: . allopurinol  100 mg Oral BID  . vitamin C  500 mg Oral BID  . atorvastatin  80 mg Oral QHS  . collagenase   Topical Daily  . docusate sodium  100 mg Oral QHS  . enoxaparin (LOVENOX) injection  40 mg Subcutaneous Q24H  . feeding supplement (ENSURE ENLIVE)  237 mL Oral BID BM  . feeding supplement (PRO-STAT SUGAR FREE 64)  30 mL Oral BID  . insulin aspart  0-5 Units Subcutaneous QHS  . insulin aspart  0-9 Units Subcutaneous TID WC  . insulin glargine  25 Units Subcutaneous Daily  . melatonin  3 mg Oral QHS  . metoprolol succinate  50 mg Oral Daily  . metroNIDAZOLE  500 mg Oral Q8H  . multivitamin with minerals  1 tablet Oral Daily  . nutrition supplement (JUVEN)  1 packet Oral BID BM  . pantoprazole  40 mg Oral Daily  . polyethylene glycol  17 g Oral Daily  . sertraline  50 mg Oral Daily  . zinc sulfate  220 mg Oral Daily   Continuous Infusions: . sodium chloride 75 mL/hr at 11/09/19 0648  . cefTRIAXone (ROCEPHIN)  IV    . potassium PHOSPHATE IVPB (in mmol) 30 mmol (11/09/19 1048)   PRN Meds:.acetaminophen, oxyCODONE-acetaminophen **AND** oxyCODONE  HPI:  Alyssa Haley is a 69 y.o. female with advanced dementia who is bedbound and with sacral decubitus ulcer that is nearly to bone along with ulcer along her shin and ankle.  She was admitted with delirium superimposed on her chronic dementia with fevers and low blood  Pressure. Her WBC was elevated but no labs in Epic since this past January of  2021.  Blood cultures were drawn alongand along  with urine cultures which were unrevealing.CXR not showing pathology. Xray tib/fibula unrevealing. CT abdomen and pelvis showed a deep decubitus ulcer with gas but without radiographic osteomyelitis. She has a fracture of the sacrum. She has been given vancomycin and ceftriaxone with improvement of  Her BP and fevers. WBC has remained high.  When I examined this patient it was clearly very painful for her to even have her wounds examined and she screamed with each exam despite attempts by RN and myself to be as gentle as possible.  I think given this patient's advanced dementia and now with multiple decubitus ulcers that we have long since entered an area of fairly futile care.   I would strongly recommend a palliative care consult.  I am adding flagyl orally to cover anerobes. Her wound  Culture that was taken from right shin  Grew MSSA,, Proteus and E  coli. That from her sacrum multiple organisms with none predominating but no Staph or GAS isolated.  I have ordered Xray of her ankle  I do not like even the idea of trying to put her through an MRI scanner. I do NOT see an "end game" that involves [place] treating this patient  With antibiotics and surgery and getting to an outcome that will meaningfully improve her quality of life. I feel embarking on such an effort would in fact potentially be unethical  Re her sacral fracture would ask  Orthopedics recommendations on palliating this.    Review of Systems: Review of Systems  Unable to perform ROS: Dementia    Past Medical History:  Diagnosis Date  .  Coronary artery disease   . Diabetes mellitus without complication (HCC)   . GERD (gastroesophageal reflux disease)   . Gout   . Hyperlipidemia   . Hypertension   . Sepsis (HCC) 05/28/2018  . Sleep apnea   . UTI (urinary tract infection) 05/2018    Social History   Tobacco Use  . Smoking status: Former Games developer  . Smokeless tobacco: Never Used  Vaping Use  . Vaping Use: Never used  Substance Use Topics  . Alcohol use: Not Currently  . Drug use: Never    Family History  Problem Relation Age of Onset  . Diabetes Mellitus II Neg Hx    Allergies  Allergen Reactions  . Penicillins Hives    Can tolerate cephalosporins    OBJECTIVE: Blood pressure (!) 150/79, pulse 96, temperature 98.6 F (37 C), temperature source Oral, resp. rate 18, height  (1.753 m), weight 79.2 kg, SpO2 100 %.  Physical Exam HENT:     Head: Normocephalic and atraumatic.  Eyes:     General: No scleral icterus.    Extraocular Movements: Extraocular movements intact.  Cardiovascular:     Rate and Rhythm: Normal rate and regular rhythm.     Heart sounds: No murmur heard.  No friction rub. No gallop.   Pulmonary:     Effort: Pulmonary effort is normal. No respiratory distress.     Breath sounds: Normal breath sounds. No wheezing.  Abdominal:     General: Bowel sounds are normal. There is no distension.  Musculoskeletal:     Cervical back: Normal range of motion.  Neurological:     Mental Status: She is disoriented.  Psychiatric:        Speech: She is noncommunicative. Speech is delayed.        Cognition and Memory: Cognition is impaired. Memory is impaired. She exhibits impaired recent memory and impaired remote memory.    Decubitus ulcers today  11/09/2019:  Sacral area:     Left shin    Left ankle:      Lab Results Lab Results  Component Value Date   WBC 24.3 (H) 11/09/2019   HGB 9.6 (L) 11/09/2019   HCT 30.1 (L) 11/09/2019   MCV 82.0 11/09/2019   PLT 354 11/09/2019     Lab Results  Component Value Date   CREATININE 0.47 11/09/2019   BUN 9 11/09/2019   NA 128 (L) 11/09/2019   K 3.4 (L) 11/09/2019   CL 100 11/09/2019   CO2 20 (L) 11/09/2019    Lab Results  Component Value Date   ALT 30 11/09/2019   AST 22 11/09/2019   ALKPHOS 102 11/09/2019   BILITOT 0.6 11/09/2019     Microbiology: Recent Results (from the past 240 hour(s))  Aerobic Culture (superficial  specimen)     Status: None   Collection Time: 11/05/19  3:54 PM   Specimen: Wound  Result Value Ref Range Status   Specimen Description   Final    WOUND RIGHT SHIN Performed at [facility], [address] [place], [place] [address]    Special Requests   Final    NONE Performed at [facility], [address], [place], [place] [address]    Gram Stain   Final    NO WBC SEEN FEW GRAM POSITIVE COCCI IN PAIRS RARE GRAM NEGATIVE RODS Performed at [facility], [address], [place], [place] [address]    Culture   Final    FEW PROTEUS MIRABILIS FEW ESCHERICHIA COLI FEW STAPHYLOCOCCUS AUREUS    Report Status 11/08/2019 FINAL  Final   Organism ID, Bacteria PROTEUS MIRABILIS  Final   Organism ID, Bacteria ESCHERICHIA COLI  Final   Organism ID, Bacteria STAPHYLOCOCCUS AUREUS  Final      Susceptibility   Escherichia coli - MIC*    AMPICILLIN >=32 RESISTANT Resistant     CEFAZOLIN <=4 SENSITIVE Sensitive     CEFEPIME <=0.12 SENSITIVE Sensitive     CEFTAZIDIME <=1 SENSITIVE Sensitive     CEFTRIAXONE <=0.25 SENSITIVE Sensitive     CIPROFLOXACIN <=0.25 SENSITIVE Sensitive     GENTAMICIN <=1 SENSITIVE Sensitive     IMIPENEM <=0.25 SENSITIVE Sensitive     TRIMETH/SULFA <=20 SENSITIVE Sensitive     AMPICILLIN/SULBACTAM 4 SENSITIVE Sensitive     PIP/TAZO <=4 SENSITIVE Sensitive     * FEW ESCHERICHIA COLI   Proteus mirabilis - MIC*    AMPICILLIN <=2 SENSITIVE Sensitive     CEFAZOLIN <=4 SENSITIVE Sensitive     CEFEPIME <=0.12 SENSITIVE  Sensitive     CEFTAZIDIME <=1 SENSITIVE Sensitive     CEFTRIAXONE <=0.25 SENSITIVE Sensitive     CIPROFLOXACIN <=0.25 SENSITIVE Sensitive     GENTAMICIN <=1 SENSITIVE Sensitive     IMIPENEM 2 SENSITIVE Sensitive     TRIMETH/SULFA <=20 SENSITIVE Sensitive     AMPICILLIN/SULBACTAM <=2 SENSITIVE Sensitive     PIP/TAZO <=4 SENSITIVE Sensitive     * FEW PROTEUS MIRABILIS   Staphylococcus aureus - MIC*    CIPROFLOXACIN <=0.5 SENSITIVE Sensitive     ERYTHROMYCIN <=0.25 SENSITIVE Sensitive     GENTAMICIN <=0.5 SENSITIVE Sensitive     OXACILLIN 0.5 SENSITIVE Sensitive     TETRACYCLINE <=1 SENSITIVE Sensitive     VANCOMYCIN <=0.5 SENSITIVE Sensitive     TRIMETH/SULFA <=10 SENSITIVE Sensitive     CLINDAMYCIN <=0.25 SENSITIVE Sensitive     RIFAMPIN <=0.5 SENSITIVE Sensitive     Inducible Clindamycin NEGATIVE Sensitive     * FEW STAPHYLOCOCCUS AUREUS  Aerobic Culture (superficial specimen)     Status: Abnormal   Collection Time: 11/05/19  3:54 PM   Specimen: Wound  Result Value Ref Range Status   Specimen Description   Final    WOUND SACRAL Performed at [facility], [address], [place], [place] [address]    Special Requests   Final    NONE Performed at [facility], [address] [place], [place] [address]    Gram Stain   Final    NO WBC SEEN FEW GRAM POSITIVE COCCI IN PAIRS FEW GRAM NEGATIVE RODS    Culture (A)  Final    MULTIPLE ORGANISMS PRESENT, NONE PREDOMINANT NO STAPHYLOCOCCUS AUREUS ISOLATED NO GROUP A STREP (S.PYOGENES) ISOLATED Performed at  [facility], [address] [place], [place] [address]    Report Status 11/08/2019 FINAL  Final  Blood culture (routine x 2)     Status: None (Preliminary result)   Collection Time: 11/05/19  6:00 PM   Specimen: BLOOD  Result Value Ref Range Status   Specimen Description   Final    BLOOD RIGHT WRIST Performed at [facility], [address] [place],  [place] [address]    Special Requests   Final    BOTTLES DRAWN AEROBIC AND ANAEROBIC Blood Culture adequate volume Performed at [facility], [address], [place], [place] [address]    Culture   Final    NO GROWTH 4 DAYS Performed at [facility], [address] [place], [place] [address]    Report Status PENDING  Incomplete  SARS Coronavirus 2 by RT PCR (hospital order, performed in [facility] hospital lab) Nasopharyngeal Nasopharyngeal Swab     Status: None   Collection Time: 11/05/19  9:00 PM   Specimen: Nasopharyngeal Swab  Result Value Ref Range Status   SARS Coronavirus 2 NEGATIVE NEGATIVE Final    Comment: (NOTE) SARS-CoV-2 target nucleic acids are NOT DETECTED.  The SARS-CoV-2 RNA is generally detectable in upper and lower respiratory specimens during the acute phase of infection. The lowest concentration of SARS-CoV-2 viral copies this assay can detect is 250 copies / mL. A negative result does not preclude SARS-CoV-2 infection and should not be used as the sole basis for treatment or other patient management decisions.  A negative result may occur with improper specimen collection / handling, submission of specimen other than nasopharyngeal swab, presence of viral mutation(s) within the areas targeted by this assay, and inadequate number of viral copies (<250 copies / mL). A negative result must be combined with clinical observations, patient history, and epidemiological information.  Fact Sheet for Patients:   BoilerBrush.com.cy  Fact Sheet for Healthcare Providers: https://pope.com/  This test is not yet approved or  cleared by the [place] FDA and has been authorized for detection and/or diagnosis of SARS-CoV-2 by FDA under an Emergency Use Authorization (EUA).  This EUA will remain in effect (meaning this test can be used) for the duration of the COVID-19 declaration under Section 564(b)(1)  of the Act, 21 U.S.C. section 360bbb-3(b)(1), unless the authorization is terminated or revoked sooner.  Performed at [facility], [address] [place], [place] [address]   Urine culture     Status: Abnormal   Collection Time: 11/06/19  8:34 AM   Specimen: Urine, Random  Result Value Ref Range Status   Specimen Description   Final    URINE, RANDOM Performed at [facility], [address] [place], [place] [address]    Special Requests   Final    NONE Performed at [facility], [address], [place], [place] [address]    Culture MULTIPLE SPECIES PRESENT, SUGGEST RECOLLECTION (A)  Final   Report Status 11/07/2019 FINAL  Final  MRSA PCR Screening     Status: None   Collection Time: 11/06/19  4:24 PM   Specimen: Nasopharyngeal  Result Value Ref Range Status   MRSA by PCR NEGATIVE NEGATIVE Final    Comment:        The GeneXpert MRSA Assay (FDA approved for NASAL specimens only), is one component of a comprehensive MRSA colonization surveillance program. It is not intended to diagnose MRSA infection nor to  guide or monitor treatment for MRSA infections. Performed at [facility], [address] [place], [place] [address]   Culture, Urine     Status: Abnormal   Collection Time: 11/08/19 12:30 PM   Specimen: Urine, Clean Catch  Result Value Ref Range Status   Specimen Description   Final    URINE, CLEAN CATCH Performed at [facility], [address] [place], [place] [address]    Special Requests   Final    NONE Performed at [facility], [address] [place], [place] [address]    Culture (A)  Final    <10,000 COLONIES/mL INSIGNIFICANT GROWTH Performed at [facility], [address] [place], [place] [address]    Report Status 11/09/2019 FINAL  Final    Acey Lavornelius Van Dam, MD [facility] [phone] pager  11/09/2019, 3:03 PM

## 2019-11-09 NOTE — TOC Initial Note (Signed)
Transition of Care [facility]) - Initial/Assessment Note    Patient Details  Name: Alyssa Haley MRN: [id number] Date of Birth: 1950/06/08  Transition of Care (TOC) CM/SW Contact:    Armanda Heritage, RN Phone Number: 11/09/2019, 4:02 PM  Clinical Narrative:                 CM confirmed patient is a long term resident at [facility] and will return to facility when medically stable.  Facility will require an updated FL2 and Covid test prior to return.  Expected Discharge Plan: Skilled Nursing Facility Barriers to Discharge: Continued Medical Work up   Patient Goals and CMS Choice Patient states their goals for this hospitalization and ongoing recovery are:: to go back to [facility]      Expected Discharge Plan and Services Expected Discharge Plan: Skilled Nursing Facility   Discharge Planning Services: CM Consult Post Acute Care Choice: Resumption of Svcs/PTA Provider Living arrangements for the past 2 months: Skilled Nursing Facility                 DME Arranged: N/A DME Agency: NA       HH Arranged: NA HH Agency: NA        Prior Living Arrangements/Services Living arrangements for the past 2 months: Skilled Nursing Facility Lives with:: Facility Resident Patient language and need for interpreter reviewed:: Yes Do you feel safe going back to the place where you live?: Yes      Need for Family Participation in Patient Care: Yes (Comment) Care giver support system in place?: Yes (comment)   Criminal Activity/Legal Involvement Pertinent to Current Situation/Hospitalization: No - Comment as needed  Activities of Daily Living Home Assistive Devices/Equipment: Other (Comment) (pt unable to answer this) ADL Screening (condition at time of admission) Patient's cognitive ability adequate to safely complete daily activities?: No Is the patient deaf or have difficulty hearing?: No Does the patient have difficulty seeing, even when wearing  glasses/contacts?: No Does the patient have difficulty concentrating, remembering, or making decisions?: Yes Patient able to express need for assistance with ADLs?: Yes Does the patient have difficulty dressing or bathing?: Yes Independently performs ADLs?: No Communication: Independent Dressing (OT): Needs assistance Is this a change from baseline?: Pre-admission baseline Grooming: Needs assistance Is this a change from baseline?: Pre-admission baseline Feeding: Needs assistance Is this a change from baseline?: Pre-admission baseline Bathing: Needs assistance Is this a change from baseline?: Pre-admission baseline Toileting: Needs assistance Is this a change from baseline?: Pre-admission baseline In/Out Bed: Needs assistance Is this a change from baseline?: Pre-admission baseline Walks in Home: Dependent Is this a change from baseline?: Pre-admission baseline Does the patient have difficulty walking or climbing stairs?: Yes Weakness of Legs: Both Weakness of Arms/Hands: Both  Permission Sought/Granted                  Emotional Assessment           Psych Involvement: No (comment)  Admission diagnosis:  Sepsis (HCC) [A41.9] AMS (altered mental status) [R41.82] Patient Active Problem List   Diagnosis Date Noted  . Hypotension 11/05/2019  . AMS (altered mental status) 11/05/2019  . Stage IV pressure ulcer of sacral region (HCC) 11/05/2019  . Wound of lower extremity, initial encounter 11/05/2019  . Transaminitis 11/05/2019  . Sepsis due to urinary tract infection (HCC) 07/07/2018  . GERD (gastroesophageal reflux disease) 07/07/2018  . Hyperlipidemia 07/07/2018  . Pressure injury of skin 07/07/2018  . Sepsis (HCC) 05/28/2018  .  CAD (coronary artery disease) 05/28/2018  . Essential hypertension 05/28/2018  . Uncontrolled type 2 diabetes mellitus with hyperglycemia (HCC) 05/28/2018  . Gout 05/28/2018  . Acute lower UTI 05/28/2018  . Swelling of joint of left knee  05/28/2018  . Sepsis secondary to UTI (HCC) 05/28/2018   PCP:  Eloisa Northern, MD Pharmacy:   [phone] [place], [place] - [address] [facility] [address] [place] [address] Phone: [phone] Fax: [phone]     Social Determinants of Health (SDOH) Interventions    Readmission Risk Interventions Readmission Risk Prevention Plan 07/22/2018  Transportation Screening Complete  PCP or Specialist Appt within 5-7 Days Complete  Home Care Screening Complete  Medication Review (RN CM) Complete

## 2019-11-10 DIAGNOSIS — R531 Weakness: Secondary | ICD-10-CM

## 2019-11-10 DIAGNOSIS — Z7189 Other specified counseling: Secondary | ICD-10-CM

## 2019-11-10 DIAGNOSIS — Z515 Encounter for palliative care: Secondary | ICD-10-CM

## 2019-11-10 LAB — BASIC METABOLIC PANEL
Anion gap: 9 (ref 5–15)
BUN: 15 mg/dL (ref 8–23)
CO2: 22 mmol/L (ref 22–32)
Calcium: 8 mg/dL — ABNORMAL LOW (ref 8.9–10.3)
Chloride: 102 mmol/L (ref 98–111)
Creatinine, Ser: 0.38 mg/dL — ABNORMAL LOW (ref 0.44–1.00)
GFR calc Af Amer: >60 mL/min (ref >60)
GFR calc non Af Amer: >60 mL/min (ref >60)
Glucose, Bld: 81 mg/dL (ref 70–99)
Potassium: 3.6 mmol/L (ref 3.5–5.1)
Sodium: 133 mmol/L — ABNORMAL LOW (ref 135–145)

## 2019-11-10 LAB — CBC
HCT: 30.2 % — ABNORMAL LOW (ref 36.0–46.0)
Hemoglobin: 9.8 g/dL — ABNORMAL LOW (ref 12.0–15.0)
MCH: 26.3 pg (ref 26.0–34.0)
MCHC: 32.5 g/dL (ref 30.0–36.0)
MCV: 81 fL (ref 80.0–100.0)
Platelets: 376 10*3/uL (ref 150–400)
RBC: 3.73 MIL/uL — ABNORMAL LOW (ref 3.87–5.11)
RDW: 16.3 % — ABNORMAL HIGH (ref 11.5–15.5)
WBC: 23.1 10*3/uL — ABNORMAL HIGH (ref 4.0–10.5)
nRBC: 0 % (ref 0.0–0.2)

## 2019-11-10 LAB — PHOSPHORUS: Phosphorus: 2 mg/dL — ABNORMAL LOW (ref 2.5–4.6)

## 2019-11-10 LAB — CULTURE, BLOOD (ROUTINE X 2)
Culture: NO GROWTH
Special Requests: ADEQUATE

## 2019-11-10 LAB — GLUCOSE, CAPILLARY
Glucose-Capillary: 183 mg/dL — ABNORMAL HIGH (ref 70–99)
Glucose-Capillary: 193 mg/dL — ABNORMAL HIGH (ref 70–99)
Glucose-Capillary: 227 mg/dL — ABNORMAL HIGH (ref 70–99)
Glucose-Capillary: 141 mg/dL — ABNORMAL HIGH (ref 70–99)

## 2019-11-10 LAB — C-REACTIVE PROTEIN: CRP: 7.3 mg/dL — ABNORMAL HIGH (ref <1.0)

## 2019-11-10 LAB — SEDIMENTATION RATE: Sed Rate: 54 mm/hr — ABNORMAL HIGH (ref 0–22)

## 2019-11-10 LAB — MAGNESIUM: Magnesium: 1.6 mg/dL — ABNORMAL LOW (ref 1.7–2.4)

## 2019-11-10 MED ORDER — MAGNESIUM SULFATE 2 GM/50ML IV SOLN
2.0000 g | Freq: Once | INTRAVENOUS | Status: AC
  Administered 2019-11-10: 2 g via INTRAVENOUS
  Filled 2019-11-10: qty 50

## 2019-11-10 MED ORDER — POTASSIUM PHOSPHATES 15 MMOLE/5ML IV SOLN
20.0000 mmol | Freq: Once | INTRAVENOUS | Status: AC
  Administered 2019-11-10: 20 mmol via INTRAVENOUS
  Filled 2019-11-10: qty 6.67

## 2019-11-10 MED ORDER — HYDROMORPHONE HCL 1 MG/ML IJ SOLN
0.5000 mg | Freq: Four times a day (QID) | INTRAMUSCULAR | Status: DC | PRN
  Administered 2019-11-10 – 2019-11-12 (×5): 0.5 mg via INTRAVENOUS
  Filled 2019-11-10 (×6): qty 0.5

## 2019-11-10 MED ORDER — LORAZEPAM 2 MG/ML IJ SOLN
0.5000 mg | Freq: Once | INTRAMUSCULAR | Status: AC
  Administered 2019-11-10: 0.5 mg via INTRAVENOUS
  Filled 2019-11-10: qty 1

## 2019-11-10 NOTE — Consult Note (Signed)
Chief Complaint: Contacted today for evaluation of right sacral decubitus ulcer as well as lower extremity heel ulcers. History: Patient has a history of dementia and is noncommunicative.  History was obtained from the medical records.  Review of system: As outlined in the medical records.  Patient has a history of hypertension, coronary artery disease, dimension, diabetes, and hyperlipidemia.  Past Medical History:  Diagnosis Date  . Coronary artery disease   . Diabetes mellitus without complication (HCC)   . GERD (gastroesophageal reflux disease)   . Gout   . Hyperlipidemia   . Hypertension   . Sepsis (HCC) 05/28/2018  . Sleep apnea   . UTI (urinary tract infection) 05/2018    Allergies  Allergen Reactions  . Penicillins Hives    Can tolerate cephalosporins    No current facility-administered medications on file prior to encounter.   Current Outpatient Medications on File Prior to Encounter  Medication Sig Dispense Refill  . acetaminophen (TYLENOL) 325 MG tablet Take 650 mg by mouth every 4 (four) hours as needed for mild pain.     Marland Kitchen allopurinol (ZYLOPRIM) 100 MG tablet Take 100 mg by mouth 2 (two) times daily.    Marland Kitchen atorvastatin (LIPITOR) 80 MG tablet Take 80 mg by mouth daily.  3  . docusate sodium (COLACE) 100 MG capsule Take 100 mg by mouth at bedtime.    . feeding supplement, GLUCERNA SHAKE, (GLUCERNA SHAKE) LIQD Take 237 mLs by mouth 3 (three) times daily between meals. (Patient taking differently: Take 237 mLs by mouth 2 (two) times daily between meals. )    . gabapentin (NEURONTIN) 300 MG capsule Take 1 capsule (300 mg total) by mouth 2 (two) times daily. (Patient taking differently: Take 300 mg by mouth 3 (three) times daily. )    . Insulin Glargine, 1 Unit Dial, (TOUJEO SOLOSTAR) 300 UNIT/ML SOPN Inject 35 Units into the skin daily. (Patient taking differently: Inject 30 Units into the skin daily. )    . Melatonin 3 MG TABS Take 1 tablet by mouth at bedtime.     . metoprolol succinate (TOPROL-XL) 50 MG 24 hr tablet Take 50 mg by mouth daily.  1  . metroNIDAZOLE (FLAGYL) 500 MG tablet 500 mg 2 (two) times daily. Apply to Sacrum topically for wound care, Clean area to sacrum with normal saline pt dry pack with dakin's soaked gauzes and crushed flagyl 500 mg cover.    . morphine (KADIAN) 30 MG 24 hr capsule Take 30 mg by mouth daily.    Marland Kitchen morphine (MS CONTIN) 15 MG 12 hr tablet Take 15 mg by mouth daily.     . Multiple Vitamin (MULTIVITAMIN WITH MINERALS) TABS tablet Take 1 tablet by mouth daily.    Marland Kitchen omeprazole (PRILOSEC) 20 MG capsule Take 20 mg by mouth daily.    Marland Kitchen oxyCODONE-acetaminophen (PERCOCET) 10-325 MG tablet Take 1 tablet by mouth every 6 (six) hours as needed for pain (Moderate - Severe pain.). (Patient taking differently: Take 1 tablet by mouth every 4 (four) hours as needed for pain. ) 10 tablet 0  . Pollen Extracts (PROSTAT PO) Take 30 mLs by mouth in the morning, at noon, and at bedtime.    . polyethylene glycol (MIRALAX) packet Take 17 g by mouth daily.    . Semaglutide, 1 MG/DOSE, (OZEMPIC, 1 MG/DOSE,) 2 MG/1.5ML SOPN Inject 1 mg into the skin once a week. Friday    . sertraline (ZOLOFT) 50 MG tablet Take 50 mg by mouth daily.    Marland Kitchen  sodium hypochlorite (DAKIN'S 1/4 STRENGTH) 0.125 % SOLN Apply 1 application topically in the morning and at bedtime. Clean area to sacrum with normal saline pat dry pack with dakin's soaked gauzes and crushed flagyl 500 mg, cover.    . traZODone (DESYREL) 50 MG tablet Take 50 mg by mouth at bedtime.    Marland Kitchen. apixaban (ELIQUIS) 5 MG TABS tablet Take 1 tablet (5 mg total) by mouth 2 (two) times daily. To be started 06/04/2018. (Patient not taking: Reported on 06/01/2019)    . insulin aspart (NOVOLOG) 100 UNIT/ML injection Inject 0-9 Units into the skin 3 (three) times daily with meals. CBG < 70: implement hypoglycemia protocol CBG 70 - 120: 0 units CBG 121 - 150: 1 unit CBG 151 - 200: 2 units CBG 201 - 250: 3 units CBG  251 - 300: 5 units CBG 301 - 350: 7 units CBG 351 - 400: 9 units CBG > 400: call MD. (Patient not taking: Reported on 11/05/2019)    . nystatin (MYCOSTATIN) 100000 UNIT/ML suspension Take 5 mLs (500,000 Units total) by mouth 4 (four) times daily. (Patient not taking: Reported on 06/01/2019) 60 mL 0  . ondansetron (ZOFRAN) 4 MG tablet Take 1 tablet (4 mg total) by mouth every 6 (six) hours as needed for nausea. (Patient not taking: Reported on 06/01/2019)    . potassium chloride SA (K-DUR,KLOR-CON) 20 MEQ tablet Take 1 tablet (20 mEq total) by mouth daily for 5 days. (Patient not taking: Reported on 11/05/2019) 5 tablet 0  . rivaroxaban (XARELTO) 20 MG TABS tablet Take 20 mg by mouth daily with supper. (Patient not taking: Reported on 11/05/2019)      Physical Exam: Vitals:   11/10/19 0951 11/10/19 1400  BP: (!) 145/111 136/70  Pulse: 78 76  Resp:  14  Temp:  (!) 97.5 F (36.4 C)  SpO2:  100%   Body mass index is 25.78 kg/m. Patient is in bed nonambulatory indicating she is in pain.  Patient is moaning but is noncommunicative.  Patient does not follow commands.  Physical exam is limited due to patient's dementia. Diminished peripheral pulses are noted in the lower extremity 1+ dorsalis pedis/posterior tibialis.  Compartments are soft and nontender.  Right heel ulceration is noted.  No exposed bone.  Positive sacral decubitus ulcer is also noted on the right side.  No exposed bone.  No obvious drainage from either wound is noted.  Image: DG Chest 1 View  Result Date: 11/05/2019 CLINICAL DATA:  Skin infection. EXAM: CHEST  1 VIEW COMPARISON:  June 01, 2019. FINDINGS: The heart size and mediastinal contours are within normal limits. Both lungs are clear. The visualized skeletal structures are unremarkable. IMPRESSION: No active disease. Electronically Signed   By: Lupita RaiderJames  Green Jr M.D.   On: 11/05/2019 17:17   DG Tibia/Fibula Left  Result Date: 11/05/2019 CLINICAL DATA:  Skin infection.  EXAM: LEFT TIBIA AND FIBULA - 2 VIEW COMPARISON:  None. FINDINGS: There is no evidence of fracture or other focal bone lesions. Soft tissues are unremarkable. IMPRESSION: Negative. Electronically Signed   By: Lupita RaiderJames  Green Jr M.D.   On: 11/05/2019 17:19   CT Head Wo Contrast  Result Date: 11/05/2019 CLINICAL DATA:  Altered mental status, unclear cause EXAM: CT HEAD WITHOUT CONTRAST TECHNIQUE: Contiguous axial images were obtained from the base of the skull through the vertex without intravenous contrast. COMPARISON:  None. FINDINGS: Brain: No evidence of acute infarction, hemorrhage, hydrocephalus, extra-axial collection or mass lesion/mass effect. Symmetric prominence of  the cisterns and sulci compatible with parenchymal volume loss. There is pronounced ventriculomegaly out of proportion to the degree of sulcal dilatation of the lateral and third ventricles with a narrowed callosal septal angle and upward bowing of the tentorium which can be seen in the setting of normal pressure hydrocephalus. Patchy areas of white matter hypoattenuation are most compatible with chronic microvascular angiopathy. Vascular: No hyperdense vessel or unexpected calcification. Skull: No calvarial fracture or suspicious osseous lesion. No scalp swelling or hematoma. Sinuses/Orbits: Paranasal sinuses and mastoid air cells are predominantly clear. Included orbital structures are unremarkable. Other: None IMPRESSION: 1. No acute intracranial abnormality. 2. Ventriculomegaly is slightly more pronounced in the volume loss seen elsewhere with additional features which can be seen in the setting of normal pressure hydrocephalus. Correlate with clinical exam findings. 3. Chronic microvascular angiopathy and parenchymal volume loss. Electronically Signed   By: Kreg Shropshire M.D.   On: 11/05/2019 19:00   CT ABDOMEN PELVIS W CONTRAST  Result Date: 11/05/2019 CLINICAL DATA:  Diffuse skin defect shin, decreased mental status and mobility EXAM: CT  ABDOMEN AND PELVIS WITH CONTRAST TECHNIQUE: Multidetector CT imaging of the abdomen and pelvis was performed using the standard protocol following bolus administration of intravenous contrast. CONTRAST:  OMNIPAQUE IOHEXOL 300 MG/ML  SOLN COMPARISON:  None. FINDINGS: Lower chest: Lung bases are clear. Normal heart size. No pericardial effusion. Coronary artery calcifications are present. Hepatobiliary: No worrisome focal liver abnormality is seen. Normal gallbladder. No visible calcified gallstones. No biliary ductal dilatation. Pancreas: Unremarkable. No pancreatic ductal dilatation or surrounding inflammatory changes. Spleen: Normal in size without focal abnormality. Adrenals/Urinary Tract: Some lobular thickening of the adrenal glands without discerning nodule, can reflect senescent change. Kidneys enhance and excrete symmetrically. There are bilateral areas of cortical scarring. No concerning renal masses. No urolithiasis or hydronephrosis. Mild bilateral symmetric perinephric stranding, a nonspecific finding though may correlate with either age or decreased renal function. Mild circumferential bladder wall thickening. Stomach/Bowel: Distal esophagus, stomach and duodenal sweep are unremarkable. No small bowel wall thickening or dilatation. No evidence of obstruction. A normal appendix is visualized. No colonic dilatation or wall thickening. Vascular/Lymphatic: Atherosclerotic calcifications throughout the abdominal aorta and branch vessels. No aneurysm or ectasia. No enlarged abdominopelvic lymph nodes. Reproductive: Fibroid uterus.  No concerning adnexal lesions. Other: Skin thickening and ulceration superficial to the right posterior sacrum with small amount subcutaneous gas and overlying skin thickening and as well as phlegmonous stranding. No organized collection or abscess is seen. Mild diffuse skin thickening of the posterior body wall and gluteal soft tissues. Correlate for features of cellulitis.  No abdominopelvic free air or fluid. No bowel containing hernias. Tiny supraumbilical fat containing ventral hernia. Musculoskeletal: Acute to subacute fracture of the fifth sacral segment. This is best demonstrated on sagittal imaging 6/87 where abrupt cortical angulation with transcortical lucency is noted. No convincing early CT features of osteomyelitis subjacent to the sacral decubitus ulceration. There is however in irregular area of cortical erosive change involving left femoral neck with some surrounding synovitis (5/59). Findings on a background of more diffuse degenerative changes of the spine hips and pelvis. Partial ankylosis at the lumbosacral junction. IMPRESSION: 1. Skin thickening and ulceration superficial to the right posterior sacrum with small amount subcutaneous gas compatible with sacral decubitus ulceration. Base of this ulceration is just superficial to the sacral bone without organized collection, abscess or CT evidence of osteomyelitis. 2. Additional mild diffuse skin thickening of the posterior body wall and gluteal soft tissues. Correlate  for features of cellulitis. 3. Irregular area of cortical erosive lucency involving left femoral neck with some surrounding synovitis, correlate for clinical symptoms as a septic joint is not excluded. 4. Acute to subacute appearing fracture of the fifth sacral segment. 5. Mild circumferential bladder could reflect a cystitis. Correlate with urinalysis findings. 6. Aortic Atherosclerosis (ICD10-I70.0). Electronically Signed   By: Kreg Shropshire M.D.   On: 11/05/2019 19:12   DG Ankle Left Port  Result Date: 11/09/2019 CLINICAL DATA:  Multiple skin ulcerations. EXAM: PORTABLE LEFT ANKLE - 2 VIEW COMPARISON:  None. FINDINGS: There appears to be a skin ulceration along the lateral malleolus. Bones are osteopenic. Dorsal calcaneal spurring is seen. There is lucency in the posterior, superior calcaneus which is unchanged since the prior exam. Soft tissues are  swollen. No soft tissue gas or radiopaque foreign body. IMPRESSION: Nonspecific lucency in the dorsal calcaneus could be due to osteomyelitis. No skin ulceration is seen in this location. Soft tissue swelling.  Negative for soft tissue gas. Osteopenia. Electronically Signed   By: Drusilla Kanner M.D.   On: 11/09/2019 15:59    A/P: I have spoken with the hospitalist team.  They indicated the patient is not medically stable for surgical intervention.  The patient has been in the hospital for some time now and is currently being managed with IV antibiotics.  With respect to the sacral decubitus ulceration.  This is not something that I treat and I would recommend plastic surgery consultation for further recommendations and treatment plan as a relates the sacral decubitus ulceration.  Lower extremity heel ulcer: Currently the patient is not a surgical candidate and so I would recommend ongoing wound care management.  This can include dressing changes or perhaps even a VAC.  Again I would defer this to the wound care clinic.  At this point time I do not think there is anything is an orthopedic surgeon I can add to her care.  If any other questions or issues arise please not hesitate to contact me.  Otherwise I will defer her management to the hospitalist team,wound care,and plastic surgery.    I have discussed this with the hospitalist and he indicated that today there was a meeting with the palliative care team.  Unfortunately the results of that family meeting not known at this time.

## 2019-11-10 NOTE — Progress Notes (Signed)
PROGRESS NOTE    Alyssa HeritageGeraldine Schlagel  ZOX:[id number] DOB: [birth year] DOA: 11/05/2019   PCP: Eloisa NorthernAmin, Saad, MD   Brief Narrative:  Patient is a 69 year old female with history of dementia, coronary artery disease, hypertension, DVT , type 2 diabetes mellitus, hyperlipidemia who was sent from nursing facility for the evaluation of altered mental status.  Nursing staff at [facility] noticed change in her mental status and also increased pain medication.  She has sacral wound and also ulcers on the left lower extremity and right heel.  She follows with wound care as an outpatient .  On presentation she had mild grade fever, tachycardic, initially hypotensive with significant leukocytosis, lactate of 2.  CT abdomen showed right posterior sacral decubitus ulcer without abscess or evidence of osteomyelitis.  There was concern for cellulitis. She was admitted for management of possible infected decubitus ulcers. Altered mental status has improved, she is getting IV antibiotics for infection.  Infectious disease consulted suggested consider palliative care and orthopedics consult. Xray: Nonspecific lucency in the dorsal calcaneus could be due to osteomyelitis.   Assessment & Plan:   Principal Problem:   Sepsis (HCC) Active Problems:   Uncontrolled type 2 diabetes mellitus with hyperglycemia (HCC)   Hypotension   AMS (altered mental status)   Stage IV pressure ulcer of sacral region (HCC)   Wound of lower extremity, initial encounter   Transaminitis  Suspected sepsis: Secondary to cellulitis of the sacral decubitus wound.  Started on vancomycin, Rocephin.  Follow-up cultures. Wound culture showed mixed organisms. Continue current antibiotics.  Wound care consulted. She has severe leukocytosis which remains elevated.  Monitor CBC.  Infectious disease consulted, suggested continue ceftriaxone and add Flagyl.  Recommended palliative care consult to discuss goals of care.  Also recommended Ortho  consult.  Hypotension: Currently normotensive.  She responded to IV fluids.  Metoprolol was on hold, now resumed as BP spikes.  There was concern for sepsis. Continue gentle IV fluids, she looks dehydrated.  Extensive pressure ulcers: She has sacral decubitus ulcer, ulcer on the lateral lower extremity, right heel ulcer.  Continue wound care.  Acute metabolic encephalopathy on the background of dementia: Currently she is oriented x 2.  Alert and awake and follows commands.  As per the daughter, she is more alert and oriented today..  Gabapentin and trazodone on hold. She is also on lot of pain meds which cud have contributed to increased lethargy/weakness/confusion.  Currently she is at her baseline mental status.  Normocytic anemia: Low iron.  We will give her a dose of IV iron.  Continue oral iron supplementation on discharge.  Mild transaminitis: Improved  Insulin-dependent diabetes type 2: On insulin at baseline.  Continue current insulin regimen.  History of DVT: Previously on Eliquis and Xarelto but not on any anticoagulation currently  Chronic pain syndrome: Continue pain medications.  She reports in a lot of pain Dilaudid 0.5 mg IV every 6 hours added for pain control   DVT prophylaxis: Lovenox Code Status: Full Family Communication: Daughter at bedside Status is: Inpatient  Remains inpatient appropriate because:IV treatments appropriate due to intensity of illness or inability to take PO   Dispo: The patient is from: SNF  Anticipated d/c is to: SNF  Anticipated d/c date is:1- 2 days  Patient currently is not medically stable to d/c.    Consultants:    None  Procedures:  None  Antimicrobials:  Anti-infectives (From admission, onward)   Start     Dose/Rate Route Frequency Ordered Stop  11/09/19 1800  cefTRIAXone (ROCEPHIN) 2 g in sodium chloride 0.9 % 100 mL IVPB     Discontinue     2 g 200 mL/hr over 30 Minutes  Intravenous Every 24 hours 11/09/19 1342     11/09/19 1400  metroNIDAZOLE (FLAGYL) tablet 500 mg     Discontinue     500 mg Oral Every 8 hours 11/09/19 1344     11/06/19 1800  cefTRIAXone (ROCEPHIN) 1 g in sodium chloride 0.9 % 100 mL IVPB  Status:  Discontinued        1 g 200 mL/hr over 30 Minutes Intravenous Every 24 hours 11/05/19 2010 11/09/19 1342   11/06/19 1000  vancomycin (VANCOCIN) IVPB 1000 mg/200 mL premix  Status:  Discontinued        1,000 mg 200 mL/hr over 60 Minutes Intravenous Every 12 hours 11/05/19 2039 11/08/19 1338   11/05/19 2045  vancomycin (VANCOREADY) IVPB 2000 mg/400 mL  Status:  Discontinued        2,000 mg 200 mL/hr over 120 Minutes Intravenous  Once 11/05/19 2030 11/05/19 2037   11/05/19 2045  vancomycin (VANCOCIN) IVPB 1000 mg/200 mL premix        1,000 mg 200 mL/hr over 60 Minutes Intravenous Every 1 hr x 2 11/05/19 2037 11/06/19 0127   11/05/19 2015  vancomycin (VANCOCIN) IVPB 1000 mg/200 mL premix  Status:  Discontinued        1,000 mg 200 mL/hr over 60 Minutes Intravenous  Once 11/05/19 2010 11/05/19 2030   11/05/19 1845  cefTRIAXone (ROCEPHIN) 2 g in sodium chloride 0.9 % 100 mL IVPB        2 g 200 mL/hr over 30 Minutes Intravenous  Once 11/05/19 1838 11/05/19 2014   11/05/19 1830  aztreonam (AZACTAM) 2 g in sodium chloride 0.9 % 100 mL IVPB  Status:  Discontinued        2 g 200 mL/hr over 30 Minutes Intravenous  Once 11/05/19 1825 11/05/19 1844   11/05/19 1800  vancomycin (VANCOREADY) IVPB 2000 mg/400 mL  Status:  Discontinued        2,000 mg 200 mL/hr over 120 Minutes Intravenous  Once 11/05/19 1757 11/05/19 1825      Subjective: Patient was seen and examined at bedside.  No overnight events.  Patient is hemodynamically stable, afebrile.   She is more alert and awake , oriented x 2, daughter was at bedside, all questions answered.  Patient continued to scream in pain even with minimal movements.  Objective: Vitals:   11/09/19 2028 11/10/19 0420  11/10/19 0951 11/10/19 1400  BP: (!) 145/77 135/65 (!) 145/111 136/70  Pulse: 72 85 78 76  Resp: 19 19  14   Temp: 98.4 F (36.9 C) 98.3 F (36.8 C)  (!) 97.5 F (36.4 C)  TempSrc:    Oral  SpO2: 100% 100%  100%  Weight:      Height:        Intake/Output Summary (Last 24 hours) at 11/10/2019 1440 Last data filed at 11/10/2019 1325 Gross per 24 hour  Intake 810 ml  Output 900 ml  Net -90 ml   Filed Weights   11/05/19 1910 11/08/19 1808  Weight: 95.3 kg 79.2 kg    Examination:  General exam: Appears Deconditioned , debilitated and chronic looking. Respiratory system: Clear to auscultation. Respiratory effort normal. Cardiovascular system: S1 & S2 heard, RRR. No JVD, murmurs, rubs, gallops or clicks. No pedal edema. Gastrointestinal system: Abdomen is nondistended, soft and nontender. No organomegaly  or masses felt. Normal bowel sounds heard. Central nervous system: Alert and oriented. No focal neurological deficits. Extremities: No edema, no clubbing, no cyanosis,  Skin: Sacral ulcer, bilateral lower extremity ulcers   Data Reviewed: I have personally reviewed following labs and imaging studies  CBC: Recent Labs  Lab 11/05/19 1717 11/05/19 1717 11/06/19 0414 11/07/19 0327 11/08/19 0410 11/09/19 0425 11/10/19 0328  WBC 19.9*   < > 24.4* 20.6* 19.2* 24.3* 23.1*  NEUTROABS 16.3*  --   --  16.1*  --   --   --   HGB 10.4*   < > 9.8* 8.9* 9.1* 9.6* 9.8*  HCT 33.3*   < > 30.7* 27.9* 28.1* 30.1* 30.2*  MCV 84.5   < > 84.8 82.8 83.4 82.0 81.0  PLT 371   < > 350 266 308 354 376   < > = values in this interval not displayed.   Basic Metabolic Panel: Recent Labs  Lab 11/06/19 0414 11/06/19 1500 11/08/19 0410 11/09/19 0425 11/10/19 0328  NA 141 135 133* 128* 133*  K 4.2 3.9 3.3* 3.4* 3.6  CL 108 104 103 100 102  CO2 24 21* 22 20* 22  GLUCOSE 232* 264* 162* 115* 81  BUN 31* 25* CREATININE 0.66 0.68 0.51 0.47 0.38*  CALCIUM 8.2* 8.0* 7.7* 7.6* 8.0*  MG  --    --   --  1.2* 1.6*  PHOS  --   --   --  1.6* 2.0*   GFR: Estimated Creatinine Clearance: 69.4 mL/min (A) (by C-G formula based on SCr of 0.38 mg/dL (L)). Liver Function Tests: Recent Labs  Lab 11/05/19 1717 11/06/19 0414 11/09/19 0425  AST 46* 36 22  ALT 53* 47* 30  ALKPHOS 113 101 102  BILITOT 0.5 0.5 0.6  PROT 7.4 6.7 5.8*  ALBUMIN 2.2* 2.1* 2.0*   No results for input(s): LIPASE, AMYLASE in the last 168 hours. No results for input(s): AMMONIA in the last 168 hours. Coagulation Profile: Recent Labs  Lab 11/05/19 1900  INR 1.1   Cardiac Enzymes: No results for input(s): CKTOTAL, CKMB, CKMBINDEX, TROPONINI in the last 168 hours. BNP (last 3 results) No results for input(s): PROBNP in the last 8760 hours. HbA1C: No results for input(s): HGBA1C in the last 72 hours. CBG: Recent Labs  Lab 11/09/19 1142 11/09/19 1731 11/09/19 2029 11/10/19 0800 11/10/19 1157  GLUCAP 185* 168* 114* 183* 193*   Lipid Profile: No results for input(s): CHOL, HDL, LDLCALC, TRIG, CHOLHDL, LDLDIRECT in the last 72 hours. Thyroid Function Tests: No results for input(s): TSH, T4TOTAL, FREET4, T3FREE, THYROIDAB in the last 72 hours. Anemia Panel: No results for input(s): VITAMINB12, FOLATE, FERRITIN, TIBC, IRON, RETICCTPCT in the last 72 hours. Sepsis Labs: Recent Labs  Lab 11/05/19 1717 11/05/19 1900  LATICACIDVEN 2.0* 2.0*    Recent Results (from the past 240 hour(s))  Aerobic Culture (superficial specimen)     Status: None   Collection Time: 11/05/19  3:54 PM   Specimen: Wound  Result Value Ref Range Status   Specimen Description   Final    WOUND RIGHT SHIN Performed at [facility], [address] [place], [place] [address]    Special Requests   Final    NONE Performed at [facility], [address] [place], [place] [address]    Gram Stain   Final    NO WBC SEEN FEW GRAM POSITIVE COCCI IN PAIRS RARE GRAM NEGATIVE RODS Performed  at [facility], [address], [place], [place] [address]    Culture   Final    FEW PROTEUS MIRABILIS FEW ESCHERICHIA COLI FEW STAPHYLOCOCCUS AUREUS    Report Status 11/08/2019 FINAL  Final   Organism ID, Bacteria PROTEUS MIRABILIS  Final   Organism ID, Bacteria ESCHERICHIA COLI  Final   Organism ID, Bacteria STAPHYLOCOCCUS AUREUS  Final      Susceptibility   Escherichia coli - MIC*    AMPICILLIN >=32 RESISTANT Resistant     CEFAZOLIN <=4 SENSITIVE Sensitive     CEFEPIME <=0.12 SENSITIVE Sensitive     CEFTAZIDIME <=1 SENSITIVE Sensitive     CEFTRIAXONE <=0.25 SENSITIVE Sensitive     CIPROFLOXACIN <=0.25 SENSITIVE Sensitive     GENTAMICIN <=1 SENSITIVE Sensitive     IMIPENEM <=0.25 SENSITIVE Sensitive     TRIMETH/SULFA <=20 SENSITIVE Sensitive     AMPICILLIN/SULBACTAM 4 SENSITIVE Sensitive     PIP/TAZO <=4 SENSITIVE Sensitive     * FEW ESCHERICHIA COLI   Proteus mirabilis - MIC*    AMPICILLIN <=2 SENSITIVE Sensitive     CEFAZOLIN <=4 SENSITIVE Sensitive     CEFEPIME <=0.12 SENSITIVE Sensitive     CEFTAZIDIME <=1 SENSITIVE Sensitive     CEFTRIAXONE <=0.25 SENSITIVE Sensitive     CIPROFLOXACIN <=0.25 SENSITIVE Sensitive     GENTAMICIN <=1 SENSITIVE Sensitive     IMIPENEM 2 SENSITIVE Sensitive     TRIMETH/SULFA <=20 SENSITIVE Sensitive     AMPICILLIN/SULBACTAM <=2 SENSITIVE Sensitive     PIP/TAZO <=4 SENSITIVE Sensitive     * FEW PROTEUS MIRABILIS   Staphylococcus aureus - MIC*    CIPROFLOXACIN <=0.5 SENSITIVE Sensitive     ERYTHROMYCIN <=0.25 SENSITIVE Sensitive     GENTAMICIN <=0.5 SENSITIVE Sensitive     OXACILLIN 0.5 SENSITIVE Sensitive     TETRACYCLINE <=1 SENSITIVE Sensitive     VANCOMYCIN <=0.5 SENSITIVE Sensitive     TRIMETH/SULFA <=10 SENSITIVE Sensitive     CLINDAMYCIN <=0.25 SENSITIVE Sensitive     RIFAMPIN <=0.5 SENSITIVE Sensitive     Inducible Clindamycin NEGATIVE Sensitive     * FEW STAPHYLOCOCCUS AUREUS  Aerobic Culture (superficial specimen)      Status: Abnormal   Collection Time: 11/05/19  3:54 PM   Specimen: Wound  Result Value Ref Range Status   Specimen Description   Final    WOUND SACRAL Performed at [facility], [address] [place], [place] [address]    Special Requests   Final    NONE Performed at [facility], [address], [place], [place] [address]    Gram Stain   Final    NO WBC SEEN FEW GRAM POSITIVE COCCI IN PAIRS FEW GRAM NEGATIVE RODS    Culture (A)  Final    MULTIPLE ORGANISMS PRESENT, NONE PREDOMINANT NO STAPHYLOCOCCUS AUREUS ISOLATED NO GROUP A STREP (S.PYOGENES) ISOLATED Performed at [facility], [address], [place], [place] [address]    Report Status 11/08/2019 FINAL  Final  Blood culture (routine x 2)     Status: None   Collection Time: 11/05/19  6:00 PM   Specimen: BLOOD  Result Value Ref Range Status   Specimen Description   Final    BLOOD RIGHT WRIST Performed at [facility], [address], [place], [place] [address]    Special Requests   Final    BOTTLES DRAWN AEROBIC AND ANAEROBIC Blood Culture adequate volume Performed at [facility], [address] [place], [place] [address]    Culture   Final    NO GROWTH 5 DAYS Performed at [facility], [address] [place], [place] [address]    Report Status 11/10/2019 FINAL  Final  SARS Coronavirus 2 by RT PCR (hospital order, performed in [facility] hospital lab) Nasopharyngeal Nasopharyngeal Swab     Status: None   Collection Time: 11/05/19  9:00 PM   Specimen: Nasopharyngeal Swab  Result Value Ref Range Status   SARS Coronavirus 2 NEGATIVE NEGATIVE Final    Comment: (NOTE) SARS-CoV-2 target nucleic acids are NOT DETECTED.  The SARS-CoV-2 RNA is generally detectable in upper and lower respiratory specimens during the acute phase of infection. The lowest concentration of SARS-CoV-2 viral copies this assay can detect is 250 copies /  mL. A negative result does not preclude SARS-CoV-2 infection and should not be used as the sole basis for treatment or other patient management decisions.  A negative result may occur with improper specimen collection / handling, submission of specimen other than nasopharyngeal swab, presence of viral mutation(s) within the areas targeted by this assay, and inadequate number of viral copies (<250 copies / mL). A negative result must be combined with clinical observations, patient history, and epidemiological information.  Fact Sheet for Patients:   BoilerBrush.com.cy  Fact Sheet for Healthcare Providers: https://pope.com/  This test is not yet approved or  cleared by the [place] FDA and has been authorized for detection and/or diagnosis of SARS-CoV-2 by FDA under an Emergency Use Authorization (EUA).  This EUA will remain in effect (meaning this test can be used) for the duration of the COVID-19 declaration under Section 564(b)(1) of the Act, 21 U.S.C. section 360bbb-3(b)(1), unless the authorization is terminated or revoked sooner.  Performed at [facility], [address], [place], [place] [address]   Urine culture     Status: Abnormal   Collection Time: 11/06/19  8:34 AM   Specimen: Urine, Random  Result Value Ref Range Status   Specimen Description   Final    URINE, RANDOM Performed at [facility], [address], [place], [place] [address]    Special Requests   Final    NONE Performed at [facility], [address], [place], [place] [address]    Culture MULTIPLE SPECIES PRESENT, SUGGEST RECOLLECTION (A)  Final   Report Status 11/07/2019 FINAL  Final  MRSA PCR Screening     Status: None   Collection Time: 11/06/19  4:24 PM   Specimen: Nasopharyngeal  Result Value Ref Range Status   MRSA by PCR NEGATIVE NEGATIVE Final    Comment:        The GeneXpert MRSA  Assay (FDA approved for NASAL specimens only), is one component of a comprehensive MRSA colonization surveillance program. It is not intended to diagnose MRSA infection nor to guide or monitor treatment for MRSA infections. Performed at [facility], [address], [place], [place] [address]   Culture, Urine     Status: Abnormal   Collection Time: 11/08/19 12:30 PM   Specimen: Urine, Clean Catch  Result Value Ref Range Status   Specimen Description   Final    URINE, CLEAN CATCH Performed at [facility], [address], [place], [place] [address]    Special Requests   Final    NONE Performed at [facility], [address], [place], [place] [address]  Culture (A)  Final    <10,000 COLONIES/mL INSIGNIFICANT GROWTH Performed at [facility], [address], [place], [place] [address]    Report Status 11/09/2019 FINAL  Final    Radiology Studies: DG Ankle Left Port  Result Date: 11/09/2019 CLINICAL DATA:  Multiple skin ulcerations. EXAM: PORTABLE LEFT ANKLE - 2 VIEW COMPARISON:  None. FINDINGS: There appears to be a skin ulceration along the lateral malleolus. Bones are osteopenic. Dorsal calcaneal spurring is seen. There is lucency in the posterior, superior calcaneus which is unchanged since the prior exam. Soft tissues are swollen. No soft tissue gas or radiopaque foreign body. IMPRESSION: Nonspecific lucency in the dorsal calcaneus could be due to osteomyelitis. No skin ulceration is seen in this location. Soft tissue swelling.  Negative for soft tissue gas. Osteopenia. Electronically Signed   By: Drusilla Kanner M.D.   On: 11/09/2019 15:59    Scheduled Meds: . allopurinol  100 mg Oral BID  . vitamin C  500 mg Oral BID  . atorvastatin  80 mg Oral QHS  . collagenase   Topical Daily  . docusate sodium  100 mg Oral QHS  . enoxaparin (LOVENOX) injection  40 mg Subcutaneous Q24H  . feeding supplement (ENSURE  ENLIVE)  237 mL Oral BID BM  . feeding supplement (PRO-STAT SUGAR FREE 64)  30 mL Oral BID  . insulin aspart  0-5 Units Subcutaneous QHS  . insulin aspart  0-9 Units Subcutaneous TID WC  . insulin glargine  25 Units Subcutaneous Daily  . melatonin  3 mg Oral QHS  . metoprolol succinate  50 mg Oral Daily  . metroNIDAZOLE  500 mg Oral Q8H  . multivitamin with minerals  1 tablet Oral Daily  . nutrition supplement (JUVEN)  1 packet Oral BID BM  . pantoprazole  40 mg Oral Daily  . polyethylene glycol  17 g Oral Daily  . sertraline  50 mg Oral Daily  . zinc sulfate  220 mg Oral Daily   Continuous Infusions: . sodium chloride 75 mL/hr at 11/10/19 0354  . cefTRIAXone (ROCEPHIN)  IV 2 g (11/09/19 1758)  . potassium PHOSPHATE IVPB (in mmol) 20 mmol (11/10/19 1009)     LOS: 5 days    Time spent: 35 mins.  Cipriano Bunker, MD [facility]   If 7PM-7AM, please contact night-coverage

## 2019-11-10 NOTE — [place] FL2 (Signed)
Reform MEDICAID FL2 LEVEL OF CARE SCREENING TOOL     IDENTIFICATION  Patient Name: Alyssa Haley Birthdate: [birth year] Sex: female Admission Date (Current Location): 11/05/2019  [facility] and [place] Number:  Producer, television/film/video and Address:  [facility],  [address], [place] [address]      Provider Number: [id number]  Attending Physician Name and Address:  Cipriano Bunker, MD  Relative Name and Phone Number:       Current Level of Care: Hospital Recommended Level of Care: Skilled Nursing Facility Prior Approval Number:    Date Approved/Denied:   PASRR Number: [id number]  Discharge Plan: SNF    Current Diagnoses: Patient Active Problem List   Diagnosis Date Noted  . Hypotension 11/05/2019  . AMS (altered mental status) 11/05/2019  . Stage IV pressure ulcer of sacral region (HCC) 11/05/2019  . Wound of lower extremity, initial encounter 11/05/2019  . Transaminitis 11/05/2019  . Sepsis due to urinary tract infection (HCC) 07/07/2018  . GERD (gastroesophageal reflux disease) 07/07/2018  . Hyperlipidemia 07/07/2018  . Pressure injury of skin 07/07/2018  . Sepsis (HCC) 05/28/2018  . CAD (coronary artery disease) 05/28/2018  . Essential hypertension 05/28/2018  . Uncontrolled type 2 diabetes mellitus with hyperglycemia (HCC) 05/28/2018  . Gout 05/28/2018  . Acute lower UTI 05/28/2018  . Swelling of joint of left knee 05/28/2018  . Sepsis secondary to UTI (HCC) 05/28/2018    Orientation RESPIRATION BLADDER Height & Weight     Self  Normal Incontinent Weight: 79.2 kg Height:  5\' 9"  (175.3 cm)  BEHAVIORAL SYMPTOMS/MOOD NEUROLOGICAL BOWEL NUTRITION STATUS      Incontinent Diet  AMBULATORY STATUS COMMUNICATION OF NEEDS Skin   Total Care Verbally PU Stage and Appropriate Care (unstageable to L ankle, L pretibial, R ankle, L foot.  Stage 2 to sacrum)   PU Stage 2 Dressing: Daily                   Personal Care Assistance Level of  Assistance  Bathing, Dressing, Total care Bathing Assistance: Maximum assistance   Dressing Assistance: Maximum assistance Total Care Assistance: Maximum assistance   Functional Limitations Info             SPECIAL CARE FACTORS FREQUENCY                       Contractures Contractures Info: Not present    Additional Factors Info  Code Status, Allergies Code Status Info: full Allergies Info: penicillins           Current Medications (11/10/2019):  This is the current hospital active medication list Current Facility-Administered Medications  Medication Dose Route Frequency Provider Last Rate Last Admin  . 0.9 %  sodium chloride infusion   Intravenous Continuous 01/11/2020, MD 75 mL/hr at 11/10/19 0354 New Bag at 11/10/19 0354  . acetaminophen (TYLENOL) tablet 650 mg  650 mg Oral Q4H PRN Tu, Ching T, DO   650 mg at 11/08/19 1651  . allopurinol (ZYLOPRIM) tablet 100 mg  100 mg Oral BID Tu, Ching T, DO   100 mg at 11/10/19 0956  . ascorbic acid (VITAMIN C) tablet 500 mg  500 mg Oral BID 01/11/20, MD   500 mg at 11/10/19 0950  . atorvastatin (LIPITOR) tablet 80 mg  80 mg Oral QHS Tu, Ching T, DO   80 mg at 11/09/19 2124  . cefTRIAXone (ROCEPHIN) 2 g in sodium chloride 0.9 % 100 mL IVPB  2 g Intravenous Q24H Daiva Eves, Lisette Grinder, MD 200 mL/hr at 11/09/19 1758 2 g at 11/09/19 1758  . collagenase (SANTYL) ointment   Topical Daily Burnadette Pop, MD   Given at 11/10/19 1030  . docusate sodium (COLACE) capsule 100 mg  100 mg Oral QHS Tu, Ching T, DO   100 mg at 11/09/19 2125  . enoxaparin (LOVENOX) injection 40 mg  40 mg Subcutaneous Q24H Tu, Ching T, DO   40 mg at 11/09/19 2125  . feeding supplement (ENSURE ENLIVE) (ENSURE ENLIVE) liquid 237 mL  237 mL Oral BID BM Cipriano Bunker, MD   237 mL at 11/10/19 1210  . feeding supplement (PRO-STAT SUGAR FREE 64) liquid 30 mL  30 mL Oral BID Cipriano Bunker, MD   30 mL at 11/10/19 0823  . HYDROmorphone (DILAUDID) injection 0.5  mg  0.5 mg Intravenous Q6H PRN Cipriano Bunker, MD      . insulin aspart (novoLOG) injection 0-5 Units  0-5 Units Subcutaneous QHS Tu, Ching T, DO   2 Units at 11/05/19 2232  . insulin aspart (novoLOG) injection 0-9 Units  0-9 Units Subcutaneous TID WC Tu, Ching T, DO   2 Units at 11/10/19 1206  . insulin glargine (LANTUS) injection 25 Units  25 Units Subcutaneous Daily Burnadette Pop, MD   25 Units at 11/10/19 0957  . melatonin tablet 3 mg  3 mg Oral QHS Tu, Ching T, DO   3 mg at 11/09/19 2124  . metoprolol succinate (TOPROL-XL) 24 hr tablet 50 mg  50 mg Oral Daily Cipriano Bunker, MD   50 mg at 11/10/19 0951  . metroNIDAZOLE (FLAGYL) tablet 500 mg  500 mg Oral Q8H Daiva Eves, Lisette Grinder, MD   500 mg at 11/10/19 0540  . multivitamin with minerals tablet 1 tablet  1 tablet Oral Daily Cipriano Bunker, MD   1 tablet at 11/10/19 0950  . nutrition supplement (JUVEN) (JUVEN) powder packet 1 packet  1 packet Oral BID BM Cipriano Bunker, MD   1 packet at 11/10/19 [phone]  . oxyCODONE-acetaminophen (PERCOCET/ROXICET) 5-325 MG per tablet 1 tablet  1 tablet Oral Q6H PRN Tu, Ching T, DO   1 tablet at 11/10/19 1031   And  . oxyCODONE (Oxy IR/ROXICODONE) immediate release tablet 5 mg  5 mg Oral Q6H PRN Tu, Ching T, DO   5 mg at 11/10/19 1031  . pantoprazole (PROTONIX) EC tablet 40 mg  40 mg Oral Daily Tu, Ching T, DO   40 mg at 11/10/19 0950  . polyethylene glycol (MIRALAX / GLYCOLAX) packet 17 g  17 g Oral Daily Tu, Ching T, DO   17 g at 11/10/19 0958  . potassium PHOSPHATE 20 mmol in dextrose 5 % 500 mL infusion  20 mmol Intravenous Once Cipriano Bunker, MD 84 mL/hr at 11/10/19 1009 20 mmol at 11/10/19 1009  . sertraline (ZOLOFT) tablet 50 mg  50 mg Oral Daily Tu, Ching T, DO   50 mg at 11/10/19 0949  . zinc sulfate capsule 220 mg  220 mg Oral Daily Cipriano Bunker, MD   220 mg at 11/10/19 7619     Discharge Medications: Please see discharge summary for a list of discharge medications.  Relevant Imaging  Results:  Relevant Lab Results:   Additional Information SS#[id number]  Armanda Heritage, RN

## 2019-11-10 NOTE — Consult Note (Signed)
Consultation Note Date: 11/10/2019   Patient Name: Alyssa Haley  DOB: [birth year]  MRN: [id number]  Age / Sex: 69 y.o., female  PCP: Garwin Brothers, MD Referring Physician: Shawna Clamp, MD  Reason for Consultation: Establishing goals of care  HPI/Patient Profile: 69 y.o. female  with past medical history of hypertension, hyperlipidemia, gout, diabetes mellitus type 2, CAD, dementia, and multiple ulcerations on sacrum and lower extremities which were being treated outpatient admitted on 11/05/2019 with sepsis secondary to cellulitis of sacral decubitus wound, hypotension secondary to sepsis, possible sacral fracture, and acute encephalopathy.   Multiple wounds present - stage IV sacral decubitus ulceration, left lateral pretibial ulcer, right lateral ankle ulcer.   ID consulted - feel treating with antibiotics and surgery will not lead to an outcome that will provide a meaningful quality of life.   Orthopedics consulted - they do not feel there is anything an orthopedic surgeon can add to her care currently - hospitalist stating patient is not medically stable for surgery at this time. They defer management to hospitalist, wound care, and plastic surgery.  Clinical Assessment and Goals of Care: I have reviewed medical records including EPIC notes, labs and imaging, and assessed the patient. Patient was lying in bed - no family present at bedside. She was sleeping but did wake up to voice and gentle touch. She was able to say "hi" and when asked if she knew she was in the hospital stated "I know," but she was unable to verbalize why she was here. She had no gestures or non-verbal signs of pain. No shortness of breath.  After patient assessment, met with daughter/Tramajah via phone to discuss diagnosis, prognosis, GOC, EOL wishes, disposition, and options.  I introduced Palliative Medicine as specialized medical care for people living with serious  illness. It focuses on providing relief from the symptoms and stress of a serious illness. The goal is to improve quality of life for both the patient and the family.  We discussed a brief life review of the patient. Ms. Leise was described as a spiritual woman. She is not married, but had three children - one daughter and two sons. One of her sons passed away earlier this year in February. Her daughter lives in [place] currently, but is trying to move to [place] by the end of the month. Her surviving son lives in [place]. Ms. Reder lived with her daughter until about a year ago; she went to [facility] in March 2020 after a hospitalization for UTI. Ivar Drape states that she was not allowed to visit her mother at this facility during the [place] pandemic except through the window and with phone calls. Herbert Moors states that it was at this time she saw a drastic decline in her mother's functional and physical status. When the patient was living with her daughter she was able to ambulate small distances with the help of a walker. After her admission to [facility], the daughter states that over time she was not able to get out of bed and as of a week ago she is now unable to feed herself anymore.  We discussed the patient's current illness and what it means in the larger context patient's on-going co-morbidities. Discussion was had around the patient's multiple wounds, high risk for recurrent sepsis, risk for osteomyelitis, and high risk for recurrent hospitalizations. The anticipation of poor wound healing was discussed in light of her poor nutritional intake, decreased function, incontinence, and diabetes. Natural disease trajectory for recurrent infections and dementia  as well as expectations at EOL were discussed.  I attempted to elicit values and goals of care important to the patient and family. The difference between aggressive medical intervention and comfort care was considered in  light of the patient's goals of care. The daughter is aware of how painful her mothers wounds are especially during dressing changes. After thorough discussion of Ms. Hoggard' medical situation as described above, Ivar Drape was interested in gaining more information on the different levels of hopsice support - education was provided. She would like her mother to be evaluated by hospice to possibly discharge her with residential or home hospice support with the understanding that the goals of care would shift from aggressive medical interventions to a more comfort path - the daughter was agreeable. She is going to discuss the hospice option with her family. The daughter expressed that she does not want her mother to return to [facility], she would rather the patient return to live at her home with hospice support or go to residential hospice.  Advance directives, concepts specific to code status, artificial feeding and hydration, and rehospitalization were considered and discussed. The daughter stated that Ms. Oberry did not have a living will or HCPOA, but that she has always been the medical decision maker for her mom. DNR/DNI was recommended in light of the patients current medical condition and the daughter was agreeable.   Discussed with patient/family the importance of continued conversation with family and the medical providers regarding overall plan of care and treatment options, ensuring decisions are within the context of the patient's values and GOCs.    Questions and concerns were addressed. The family was encouraged to call with questions or concerns.    Primary Decision Maker NEXT OF KIN Glendell Docker - daughter  SUMMARY OF RECOMMENDATIONS   -Continue current medical treatment  -DNR/DNI initiated -Daughter is considering the different levels of hospice support and will discuss with her family.  -Daughter seems to be hopeful for residential hospice at this time. If this is  decided, daughter would like discharge to [facility]. -Daughter requested that if patient can tolerate, she would like MRI completed for assessment of osteomylitis. She is aware results would not change treatment plan or outcomes as well as that the patient may not tolerate scan. -PMT will continue to follow holistically  Code Status/Advance Care Planning:  DNR  Palliative Prophylaxis:   Aspiration, Bowel Regimen, Delirium Protocol, Frequent Pain Assessment, Oral Care and Turn Reposition  Additional Recommendations (Limitations, Scope, Preferences):  Full Scope Treatment  Psycho-social/Spiritual:   Provided space and opportunity for patient and family to express thoughts and feelings regarding patient's current medical situation.  Emotional support provided.  Prognosis:   < 2 weeks  Discharge Planning: Hospice facility vs home with hospice     Primary Diagnoses: Present on Admission: . Sepsis ([place]) . Uncontrolled type 2 diabetes mellitus with hyperglycemia ([place])   I have reviewed the medical record, interviewed the patient and family, and examined the patient. The following aspects are pertinent.  Past Medical History:  Diagnosis Date  . Coronary artery disease   . Diabetes mellitus without complication ([place])   . GERD (gastroesophageal reflux disease)   . Gout   . Hyperlipidemia   . Hypertension   . Sepsis ([place]) 05/28/2018  . Sleep apnea   . UTI (urinary tract infection) 05/2018   Social History   Socioeconomic History  . Marital status: Single    Spouse name: Not on file  .  Number of children: Not on file  . Years of education: Not on file  . Highest education level: Not on file  Occupational History  . Not on file  Tobacco Use  . Smoking status: Former Research scientist (life sciences)  . Smokeless tobacco: Never Used  Vaping Use  . Vaping Use: Never used  Substance and Sexual Activity  . Alcohol use: Not Currently  . Drug use: Never  . Sexual activity: Not Currently   Other Topics Concern  . Not on file  Social History Narrative  . Not on file   Social Determinants of Health   Financial Resource Strain:   . Difficulty of Paying Living Expenses:   Food Insecurity:   . Worried About Charity fundraiser in the Last Year:   . Arboriculturist in the Last Year:   Transportation Needs:   . Film/video editor (Medical):   Marland Kitchen Lack of Transportation (Non-Medical):   Physical Activity:   . Days of Exercise per Week:   . Minutes of Exercise per Session:   Stress:   . Feeling of Stress :   Social Connections:   . Frequency of Communication with Friends and Family:   . Frequency of Social Gatherings with Friends and Family:   . Attends Religious Services:   . Active Member of Clubs or Organizations:   . Attends Archivist Meetings:   Marland Kitchen Marital Status:    Family History  Problem Relation Age of Onset  . Diabetes Mellitus II Neg Hx    Scheduled Meds: . allopurinol  100 mg Oral BID  . vitamin C  500 mg Oral BID  . atorvastatin  80 mg Oral QHS  . collagenase   Topical Daily  . docusate sodium  100 mg Oral QHS  . enoxaparin (LOVENOX) injection  40 mg Subcutaneous Q24H  . feeding supplement (ENSURE ENLIVE)  237 mL Oral BID BM  . feeding supplement (PRO-STAT SUGAR FREE 64)  30 mL Oral BID  . insulin aspart  0-5 Units Subcutaneous QHS  . insulin aspart  0-9 Units Subcutaneous TID WC  . insulin glargine  25 Units Subcutaneous Daily  . melatonin  3 mg Oral QHS  . metoprolol succinate  50 mg Oral Daily  . metroNIDAZOLE  500 mg Oral Q8H  . multivitamin with minerals  1 tablet Oral Daily  . nutrition supplement (JUVEN)  1 packet Oral BID BM  . pantoprazole  40 mg Oral Daily  . polyethylene glycol  17 g Oral Daily  . sertraline  50 mg Oral Daily  . zinc sulfate  220 mg Oral Daily   Continuous Infusions: . sodium chloride 75 mL/hr at 11/10/19 0354  . cefTRIAXone (ROCEPHIN)  IV 2 g (11/10/19 1710)   PRN Meds:.acetaminophen, HYDROmorphone  (DILAUDID) injection, oxyCODONE-acetaminophen **AND** oxyCODONE Allergies  Allergen Reactions  . Penicillins Hives    Can tolerate cephalosporins   Review of Systems  Unable to perform ROS: Dementia    Physical Exam Vitals and nursing note reviewed.  Constitutional:      General: She is not in acute distress. Pulmonary:     Effort: No respiratory distress.  Skin:    General: Skin is warm and dry.     Findings: Wound present.  Neurological:     Mental Status: She is confused.     Motor: Weakness present.  Psychiatric:        Cognition and Memory: Cognition is impaired.     Vital Signs: BP (!) 155/83 (  BP Location: Left Arm)   Pulse 82   Temp 98 F (36.7 C)   Resp 18   Ht _0  (1.753 m)   Wt 79.2 kg   SpO2 100%   BMI 25.78 kg/m  Pain Scale: PAINAD   Pain Score: 8    SpO2: SpO2: 100 % O2 Device:SpO2: 100 % O2 Flow Rate: .   IO: Intake/output summary:   Intake/Output Summary (Last 24 hours) at 11/10/2019 2035 Last data filed at 11/10/2019 1600 Gross per 24 hour  Intake 1430 ml  Output 1300 ml  Net 130 ml    LBM: Last BM Date: 11/09/19 Baseline Weight: Weight: 95.3 kg Most recent weight: Weight: 79.2 kg     Palliative Assessment/Data: PPS 30%   Discussed care with Dr. Dwyane Dee, daughter/Tramajah  Time In: 1600 Time Out: 1710 Time Total: 70 minutes Greater than 50%  of this time was spent counseling and coordinating care related to the above assessment and plan.  Amber M. Tamala Julian, FNP  Loistine Chance MD

## 2019-11-11 LAB — CBC
HCT: 36.2 % (ref 36.0–46.0)
Hemoglobin: 11.7 g/dL — ABNORMAL LOW (ref 12.0–15.0)
MCH: 26.1 pg (ref 26.0–34.0)
MCHC: 32.3 g/dL (ref 30.0–36.0)
MCV: 80.8 fL (ref 80.0–100.0)
Platelets: 381 10*3/uL (ref 150–400)
RBC: 4.48 MIL/uL (ref 3.87–5.11)
RDW: 16.9 % — ABNORMAL HIGH (ref 11.5–15.5)
WBC: 17.6 10*3/uL — ABNORMAL HIGH (ref 4.0–10.5)
nRBC: 0 % (ref 0.0–0.2)

## 2019-11-11 LAB — GLUCOSE, CAPILLARY
Glucose-Capillary: 71 mg/dL (ref 70–99)
Glucose-Capillary: 114 mg/dL — ABNORMAL HIGH (ref 70–99)
Glucose-Capillary: 179 mg/dL — ABNORMAL HIGH (ref 70–99)
Glucose-Capillary: 195 mg/dL — ABNORMAL HIGH (ref 70–99)
Glucose-Capillary: 220 mg/dL — ABNORMAL HIGH (ref 70–99)

## 2019-11-11 LAB — BASIC METABOLIC PANEL
Anion gap: 8 (ref 5–15)
BUN: 21 mg/dL (ref 8–23)
CO2: 23 mmol/L (ref 22–32)
Calcium: 7.9 mg/dL — ABNORMAL LOW (ref 8.9–10.3)
Chloride: 102 mmol/L (ref 98–111)
Creatinine, Ser: 0.5 mg/dL (ref 0.44–1.00)
GFR calc Af Amer: >60 mL/min (ref >60)
GFR calc non Af Amer: >60 mL/min (ref >60)
Glucose, Bld: 93 mg/dL (ref 70–99)
Potassium: 3.7 mmol/L (ref 3.5–5.1)
Sodium: 133 mmol/L — ABNORMAL LOW (ref 135–145)

## 2019-11-11 LAB — PHOSPHORUS: Phosphorus: 1.6 mg/dL — ABNORMAL LOW (ref 2.5–4.6)

## 2019-11-11 LAB — MAGNESIUM: Magnesium: 1.7 mg/dL (ref 1.7–2.4)

## 2019-11-11 MED ORDER — SODIUM PHOSPHATES 45 MMOLE/15ML IV SOLN
20.0000 mmol | Freq: Once | INTRAVENOUS | Status: AC
  Administered 2019-11-11 (×2): 20 mmol via INTRAVENOUS
  Filled 2019-11-11: qty 6.67

## 2019-11-11 MED ORDER — DEXTROSE 10 % IV SOLN: INTRAVENOUS | Status: DC

## 2019-11-11 NOTE — Progress Notes (Signed)
PROGRESS NOTE    Alyssa Haley  WUJ:[id number] DOB: 1951-01-27 DOA: 11/05/2019   PCP: Eloisa Northern, MD   Brief Narrative:  Patient is a 69 year old female with history of dementia, coronary artery disease, hypertension, DVT , type 2 diabetes mellitus, hyperlipidemia who was sent from nursing facility for the evaluation of altered mental status.  Nursing staff at [facility] health facility noticed change in her mental status and also increased pain medication.  She has sacral wound and also ulcers on the left lower extremity and right heel.  She follows with wound care as an outpatient .  On presentation she had mild grade fever, tachycardic, initially hypotensive with significant leukocytosis, lactate of 2.  CT abdomen showed right posterior sacral decubitus ulcer without abscess or evidence of osteomyelitis.  There was concern for cellulitis. She was admitted for management of possible infected decubitus ulcers. Altered mental status has improved, she is getting IV antibiotics for infection.  Infectious disease consulted suggested consider palliative care and orthopedics consult. Xray: Nonspecific lucency in the dorsal calcaneus could be due to osteomyelitis. Palliative care consulted,  family meeting has arranged.  Daughter is agreeable to residential hospice, awaiting approval.   Assessment & Plan:   Principal Problem:   Sepsis (HCC) Active Problems:   Uncontrolled type 2 diabetes mellitus with hyperglycemia (HCC)   Hypotension   AMS (altered mental status)   Stage IV pressure ulcer of sacral region (HCC)   Wound of lower extremity, initial encounter   Transaminitis  Suspected sepsis: Secondary to cellulitis of the sacral decubitus wound.  Started on vancomycin, Rocephin.  Follow-up cultures. Wound culture showed mixed organisms. Continue current antibiotics.  Wound care consulted. She has severe leukocytosis which remains elevated.  Monitor CBC.  Infectious disease consulted, suggested  continue ceftriaxone and add Flagyl.  Recommended palliative care consult to discuss goals of care.  Also recommended Ortho consult.  Hypotension: Currently normotensive.  She responded to IV fluids.  Metoprolol was on hold, now resumed as BP spikes.  There was concern for sepsis. Continue gentle IV fluids, she looks dehydrated.  Extensive pressure ulcers: She has sacral decubitus ulcer, ulcer on the lateral lower extremity, right heel ulcer.  Continue wound care.  Ortho consulted, recommended no surgical intervention at this point.  Acute metabolic encephalopathy on the background of dementia: Currently she is oriented x 2.  Alert and awake and follows commands.  As per the daughter, she is more alert and oriented today..  Gabapentin and trazodone on hold. She is also on lot of pain meds which cud have contributed to increased lethargy/weakness/confusion.  Currently she is at her baseline mental status.  Normocytic anemia: Low iron.  We will give her a dose of IV iron.  Continue oral iron supplementation on discharge.  Mild transaminitis: Improved  Insulin-dependent diabetes type 2: On insulin at baseline.  Continue current insulin regimen.  History of DVT: Previously on Eliquis and Xarelto but not on any anticoagulation currently.  Chronic pain syndrome: Continue pain medications.  She reports in a lot of pain Dilaudid 0.5 mg IV every 6 hours added for pain control   DVT prophylaxis: Lovenox Code Status: Full Family Communication: Daughter at bedside Status is: Inpatient  Remains inpatient appropriate because:IV treatments appropriate due to intensity of illness or inability to take PO   Dispo: The patient is from: SNF  Anticipated d/c is to: Residential hospice  Anticipated d/c date is:1- 2 days  Patient currently is not medically stable to d/c.  Consultants:    None  Procedures:  None  Antimicrobials:  Anti-infectives  (From admission, onward)   Start     Dose/Rate Route Frequency Ordered Stop   11/09/19 1800  cefTRIAXone (ROCEPHIN) 2 g in sodium chloride 0.9 % 100 mL IVPB     Discontinue     2 g 200 mL/hr over 30 Minutes Intravenous Every 24 hours 11/09/19 1342     11/09/19 1400  metroNIDAZOLE (FLAGYL) tablet 500 mg     Discontinue     500 mg Oral Every 8 hours 11/09/19 1344     11/06/19 1800  cefTRIAXone (ROCEPHIN) 1 g in sodium chloride 0.9 % 100 mL IVPB  Status:  Discontinued        1 g 200 mL/hr over 30 Minutes Intravenous Every 24 hours 11/05/19 2010 11/09/19 1342   11/06/19 1000  vancomycin (VANCOCIN) IVPB 1000 mg/200 mL premix  Status:  Discontinued        1,000 mg 200 mL/hr over 60 Minutes Intravenous Every 12 hours 11/05/19 2039 11/08/19 1338   11/05/19 2045  vancomycin (VANCOREADY) IVPB 2000 mg/400 mL  Status:  Discontinued        2,000 mg 200 mL/hr over 120 Minutes Intravenous  Once 11/05/19 2030 11/05/19 2037   11/05/19 2045  vancomycin (VANCOCIN) IVPB 1000 mg/200 mL premix        1,000 mg 200 mL/hr over 60 Minutes Intravenous Every 1 hr x 2 11/05/19 2037 11/06/19 0127   11/05/19 2015  vancomycin (VANCOCIN) IVPB 1000 mg/200 mL premix  Status:  Discontinued        1,000 mg 200 mL/hr over 60 Minutes Intravenous  Once 11/05/19 2010 11/05/19 2030   11/05/19 1845  cefTRIAXone (ROCEPHIN) 2 g in sodium chloride 0.9 % 100 mL IVPB        2 g 200 mL/hr over 30 Minutes Intravenous  Once 11/05/19 1838 11/05/19 2014   11/05/19 1830  aztreonam (AZACTAM) 2 g in sodium chloride 0.9 % 100 mL IVPB  Status:  Discontinued        2 g 200 mL/hr over 30 Minutes Intravenous  Once 11/05/19 1825 11/05/19 1844   11/05/19 1800  vancomycin (VANCOREADY) IVPB 2000 mg/400 mL  Status:  Discontinued        2,000 mg 200 mL/hr over 120 Minutes Intravenous  Once 11/05/19 1757 11/05/19 1825      Subjective: Patient was seen and examined at bedside.  No overnight events.  Patient is hemodynamically stable, afebrile.     She is more alert and awake , oriented x 2, daughter was at bedside, all questions answered.  Patient continued to scream in pain even with minimal movements.  Objective: Vitals:   11/10/19 0951 11/10/19 1400 11/10/19 1955 11/11/19 0513  BP: (!) 145/111 136/70 (!) 155/83 (!) 151/71  Pulse: 78 76 82 65  Resp:  14 18 19   Temp:  (!) 97.5 F (36.4 C) 98 F (36.7 C) 98.1 F (36.7 C)  TempSrc:  Oral    SpO2:  100% 100% 100%  Weight:      Height:        Intake/Output Summary (Last 24 hours) at 11/11/2019 1352 Last data filed at 11/11/2019 1015 Gross per 24 hour  Intake 2540 ml  Output 1550 ml  Net 990 ml   Filed Weights   11/05/19 1910 11/08/19 1808  Weight: 95.3 kg 79.2 kg    Examination:  General exam: Appears Deconditioned , debilitated and chronic looking. Respiratory system: Clear  to auscultation. Respiratory effort normal. Cardiovascular system: S1 & S2 heard, RRR. No JVD, murmurs, rubs, gallops or clicks. No pedal edema. Gastrointestinal system: Abdomen is nondistended, soft and nontender. No organomegaly or masses felt. Normal bowel sounds heard. Central nervous system: Alert and oriented. No focal neurological deficits. Extremities: No edema, no clubbing, no cyanosis,  Skin: Sacral ulcer, bilateral lower extremity ulcers   Data Reviewed: I have personally reviewed following labs and imaging studies  CBC: Recent Labs  Lab 11/05/19 1717 11/06/19 0414 11/07/19 0327 11/08/19 0410 11/09/19 0425 11/10/19 0328 11/11/19 0328  WBC 19.9*   < > 20.6* 19.2* 24.3* 23.1* 17.6*  NEUTROABS 16.3*  --  16.1*  --   --   --   --   HGB 10.4*   < > 8.9* 9.1* 9.6* 9.8* 11.7*  HCT 33.3*   < > 27.9* 28.1* 30.1* 30.2* 36.2  MCV 84.5   < > 82.8 83.4 82.0 81.0 80.8  PLT 371   < > 266 308 354 376 381   < > = values in this interval not displayed.   Basic Metabolic Panel: Recent Labs  Lab 11/06/19 1500 11/08/19 0410 11/09/19 0425 11/10/19 0328 11/11/19 0328  NA 135 133* 128*  133* 133*  K 3.9 3.3* 3.4* 3.6 3.7  CL 104 103 100 102 102  CO2 21* 22 20* 22 23  GLUCOSE 264* 162* 115* 81 93  BUN 25* CREATININE 0.68 0.51 0.47 0.38* 0.50  CALCIUM 8.0* 7.7* 7.6* 8.0* 7.9*  MG  --   --  1.2* 1.6* 1.7  PHOS  --   --  1.6* 2.0* 1.6*   GFR: Estimated Creatinine Clearance: 69.4 mL/min (by C-G formula based on SCr of 0.5 mg/dL). Liver Function Tests: Recent Labs  Lab 11/05/19 1717 11/06/19 0414 11/09/19 0425  AST 46* 36 22  ALT 53* 47* 30  ALKPHOS 113 101 102  BILITOT 0.5 0.5 0.6  PROT 7.4 6.7 5.8*  ALBUMIN 2.2* 2.1* 2.0*   No results for input(s): LIPASE, AMYLASE in the last 168 hours. No results for input(s): AMMONIA in the last 168 hours. Coagulation Profile: Recent Labs  Lab 11/05/19 1900  INR 1.1   Cardiac Enzymes: No results for input(s): CKTOTAL, CKMB, CKMBINDEX, TROPONINI in the last 168 hours. BNP (last 3 results) No results for input(s): PROBNP in the last 8760 hours. HbA1C: No results for input(s): HGBA1C in the last 72 hours. CBG: Recent Labs  Lab 11/10/19 1157 11/10/19 1656 11/10/19 1957 11/11/19 0730 11/11/19 1125  GLUCAP 193* 227* 141* 71 114*   Lipid Profile: No results for input(s): CHOL, HDL, LDLCALC, TRIG, CHOLHDL, LDLDIRECT in the last 72 hours. Thyroid Function Tests: No results for input(s): TSH, T4TOTAL, FREET4, T3FREE, THYROIDAB in the last 72 hours. Anemia Panel: No results for input(s): VITAMINB12, FOLATE, FERRITIN, TIBC, IRON, RETICCTPCT in the last 72 hours. Sepsis Labs: Recent Labs  Lab 11/05/19 1717 11/05/19 1900  LATICACIDVEN 2.0* 2.0*    Recent Results (from the past 240 hour(s))  Aerobic Culture (superficial specimen)     Status: None   Collection Time: 11/05/19  3:54 PM   Specimen: Wound  Result Value Ref Range Status   Specimen Description   Final    WOUND RIGHT SHIN Performed at [facility], [address], [place], [place] [address]    Special Requests   Final      NONE Performed at [facility], [address] [place],  [place] [id number]    Gram Stain   Final    NO WBC SEEN FEW GRAM POSITIVE COCCI IN PAIRS RARE GRAM NEGATIVE RODS Performed at [facility], [address] [place], [place] [address]    Culture   Final    FEW PROTEUS MIRABILIS FEW ESCHERICHIA COLI FEW STAPHYLOCOCCUS AUREUS    Report Status 11/08/2019 FINAL  Final   Organism ID, Bacteria PROTEUS MIRABILIS  Final   Organism ID, Bacteria ESCHERICHIA COLI  Final   Organism ID, Bacteria STAPHYLOCOCCUS AUREUS  Final      Susceptibility   Escherichia coli - MIC*    AMPICILLIN >=32 RESISTANT Resistant     CEFAZOLIN <=4 SENSITIVE Sensitive     CEFEPIME <=0.12 SENSITIVE Sensitive     CEFTAZIDIME <=1 SENSITIVE Sensitive     CEFTRIAXONE <=0.25 SENSITIVE Sensitive     CIPROFLOXACIN <=0.25 SENSITIVE Sensitive     GENTAMICIN <=1 SENSITIVE Sensitive     IMIPENEM <=0.25 SENSITIVE Sensitive     TRIMETH/SULFA <=20 SENSITIVE Sensitive     AMPICILLIN/SULBACTAM 4 SENSITIVE Sensitive     PIP/TAZO <=4 SENSITIVE Sensitive     * FEW ESCHERICHIA COLI   Proteus mirabilis - MIC*    AMPICILLIN <=2 SENSITIVE Sensitive     CEFAZOLIN <=4 SENSITIVE Sensitive     CEFEPIME <=0.12 SENSITIVE Sensitive     CEFTAZIDIME <=1 SENSITIVE Sensitive     CEFTRIAXONE <=0.25 SENSITIVE Sensitive     CIPROFLOXACIN <=0.25 SENSITIVE Sensitive     GENTAMICIN <=1 SENSITIVE Sensitive     IMIPENEM 2 SENSITIVE Sensitive     TRIMETH/SULFA <=20 SENSITIVE Sensitive     AMPICILLIN/SULBACTAM <=2 SENSITIVE Sensitive     PIP/TAZO <=4 SENSITIVE Sensitive     * FEW PROTEUS MIRABILIS   Staphylococcus aureus - MIC*    CIPROFLOXACIN <=0.5 SENSITIVE Sensitive     ERYTHROMYCIN <=0.25 SENSITIVE Sensitive     GENTAMICIN <=0.5 SENSITIVE Sensitive     OXACILLIN 0.5 SENSITIVE Sensitive     TETRACYCLINE <=1 SENSITIVE Sensitive     VANCOMYCIN <=0.5 SENSITIVE Sensitive     TRIMETH/SULFA <=10 SENSITIVE Sensitive      CLINDAMYCIN <=0.25 SENSITIVE Sensitive     RIFAMPIN <=0.5 SENSITIVE Sensitive     Inducible Clindamycin NEGATIVE Sensitive     * FEW STAPHYLOCOCCUS AUREUS  Aerobic Culture (superficial specimen)     Status: Abnormal   Collection Time: 11/05/19  3:54 PM   Specimen: Wound  Result Value Ref Range Status   Specimen Description   Final    WOUND SACRAL Performed at [facility], [address] [place], [place] [address]    Special Requests   Final    NONE Performed at [facility], [address] [place], [place] [address]    Gram Stain   Final    NO WBC SEEN FEW GRAM POSITIVE COCCI IN PAIRS FEW GRAM NEGATIVE RODS    Culture (A)  Final    MULTIPLE ORGANISMS PRESENT, NONE PREDOMINANT NO STAPHYLOCOCCUS AUREUS ISOLATED NO GROUP A STREP (S.PYOGENES) ISOLATED Performed at [facility], [address] [place], [place] [address]    Report Status 11/08/2019 FINAL  Final  Blood culture (routine x 2)     Status: None   Collection Time: 11/05/19  6:00 PM   Specimen: BLOOD  Result Value Ref Range Status   Specimen Description   Final    BLOOD RIGHT WRIST Performed at [facility], [address] [place],  [place] [address]    Special Requests   Final    BOTTLES DRAWN AEROBIC AND ANAEROBIC Blood Culture adequate volume Performed at [facility], [address], [place], [place] [address]    Culture   Final    NO GROWTH 5 DAYS Performed at [facility], [address], [place], [place] [address]    Report Status 11/10/2019 FINAL  Final  SARS Coronavirus 2 by RT PCR (hospital order, performed in [facility] hospital lab) Nasopharyngeal Nasopharyngeal Swab     Status: None   Collection Time: 11/05/19  9:00 PM   Specimen: Nasopharyngeal Swab  Result Value Ref Range Status   SARS Coronavirus 2 NEGATIVE NEGATIVE Final    Comment: (NOTE) SARS-CoV-2 target nucleic acids are NOT DETECTED.  The  SARS-CoV-2 RNA is generally detectable in upper and lower respiratory specimens during the acute phase of infection. The lowest concentration of SARS-CoV-2 viral copies this assay can detect is 250 copies / mL. A negative result does not preclude SARS-CoV-2 infection and should not be used as the sole basis for treatment or other patient management decisions.  A negative result may occur with improper specimen collection / handling, submission of specimen other than nasopharyngeal swab, presence of viral mutation(s) within the areas targeted by this assay, and inadequate number of viral copies (<250 copies / mL). A negative result must be combined with clinical observations, patient history, and epidemiological information.  Fact Sheet for Patients:   BoilerBrush.com.cy  Fact Sheet for Healthcare Providers: https://pope.com/  This test is not yet approved or  cleared by the [place] FDA and has been authorized for detection and/or diagnosis of SARS-CoV-2 by FDA under an Emergency Use Authorization (EUA).  This EUA will remain in effect (meaning this test can be used) for the duration of the COVID-19 declaration under Section 564(b)(1) of the Act, 21 U.S.C. section 360bbb-3(b)(1), unless the authorization is terminated or revoked sooner.  Performed at [facility], [address] [place], [place] [address]   Urine culture     Status: Abnormal   Collection Time: 11/06/19  8:34 AM   Specimen: Urine, Random  Result Value Ref Range Status   Specimen Description   Final    URINE, RANDOM Performed at [facility], [address], [place], [place] [address]    Special Requests   Final    NONE Performed at [facility], [address] [place], [place] [address]    Culture MULTIPLE SPECIES PRESENT, SUGGEST RECOLLECTION (A)  Final   Report Status 11/07/2019 FINAL  Final  MRSA  PCR Screening     Status: None   Collection Time: 11/06/19  4:24 PM   Specimen: Nasopharyngeal  Result Value Ref Range Status   MRSA by PCR NEGATIVE NEGATIVE Final    Comment:        The GeneXpert MRSA Assay (FDA approved for NASAL specimens only), is one component of a comprehensive MRSA colonization surveillance program. It is not intended to diagnose MRSA infection nor to guide or monitor treatment for MRSA infections. Performed at [facility], [address] [place], [place] [address]   Culture, Urine     Status: Abnormal   Collection Time: 11/08/19 12:30 PM   Specimen: Urine, Clean Catch  Result Value Ref Range Status   Specimen Description   Final    URINE, CLEAN CATCH Performed at [facility], [address], [place], [place] [address]  Special Requests   Final    NONE Performed at [facility], [address] [place], [place] [address]    Culture (A)  Final    <10,000 COLONIES/mL INSIGNIFICANT GROWTH Performed at [facility], [address] [place], [place] [address]    Report Status 11/09/2019 FINAL  Final    Radiology Studies: DG Ankle Left Port  Result Date: 11/09/2019 CLINICAL DATA:  Multiple skin ulcerations. EXAM: PORTABLE LEFT ANKLE - 2 VIEW COMPARISON:  None. FINDINGS: There appears to be a skin ulceration along the lateral malleolus. Bones are osteopenic. Dorsal calcaneal spurring is seen. There is lucency in the posterior, superior calcaneus which is unchanged since the prior exam. Soft tissues are swollen. No soft tissue gas or radiopaque foreign body. IMPRESSION: Nonspecific lucency in the dorsal calcaneus could be due to osteomyelitis. No skin ulceration is seen in this location. Soft tissue swelling.  Negative for soft tissue gas. Osteopenia. Electronically Signed   By: Drusilla Kanner M.D.   On: 11/09/2019 15:59    Scheduled Meds: . allopurinol  100 mg Oral BID  . vitamin C  500 mg  Oral BID  . atorvastatin  80 mg Oral QHS  . collagenase   Topical Daily  . docusate sodium  100 mg Oral QHS  . enoxaparin (LOVENOX) injection  40 mg Subcutaneous Q24H  . feeding supplement (ENSURE ENLIVE)  237 mL Oral BID BM  . feeding supplement (PRO-STAT SUGAR FREE 64)  30 mL Oral BID  . insulin aspart  0-5 Units Subcutaneous QHS  . insulin aspart  0-9 Units Subcutaneous TID WC  . insulin glargine  25 Units Subcutaneous Daily  . melatonin  3 mg Oral QHS  . metoprolol succinate  50 mg Oral Daily  . metroNIDAZOLE  500 mg Oral Q8H  . multivitamin with minerals  1 tablet Oral Daily  . nutrition supplement (JUVEN)  1 packet Oral BID BM  . pantoprazole  40 mg Oral Daily  . polyethylene glycol  17 g Oral Daily  . sertraline  50 mg Oral Daily  . zinc sulfate  220 mg Oral Daily   Continuous Infusions: . sodium chloride 75 mL/hr at 11/11/19 0016  . cefTRIAXone (ROCEPHIN)  IV 2 g (11/10/19 1710)  . [COMPLETED] sodium phosphate  Dextrose 5% IVPB 20 mmol (11/11/19 1015)     LOS: 6 days   Time spent: 35 mins.  Cipriano Bunker, MD [facility]   If 7PM-7AM, please contact night-coverage

## 2019-11-11 NOTE — Progress Notes (Signed)
PMT brief progress note  Patient seen resting in bed, awake, having some restless movements. In no distress. Discussed with TOC and bedside nursing, chart reviewed.   BP (!) 151/71 (BP Location: Left Arm)    Pulse 65    Temp 98.1 F (36.7 C)    Resp 19    Ht 5\' 9"  (1.753 m)    Wt 79.2 kg    SpO2 100%    BMI 25.78 kg/m  Labs and imaging reviewed Med history noted  No distress Regular work of breathing Awake alert No edema Some contractures Abdomen not distended  PPS 30%  Plan: Continue current mode of care Residential hospice consultation as per discussions with daughter on the phone at the time of initial palliative consultation on 11-10-19.   Greater than 50%  of this time was spent counseling and coordinating care related to the above assessment   03-13-1985 MD [facility] health palliative [phone].

## 2019-11-11 NOTE — Care Management Important Message (Signed)
Important Message  Patient Details IM Letter given to [name] SW Case Manager to present to the Patient Name: Alyssa Haley MRN: [id number] Date of Birth: 04/01/1951   Medicare Important Message Given:  Yes     Caren Macadam 11/11/2019, 10:31 AM

## 2019-11-11 NOTE — Progress Notes (Signed)
Civil engineer, contracting [facility])  Referral received for residential hospice at [facility].  Spoke with daughter to confirm interest.  She understands that there are no beds available today and that [facility] MD will have to agree on eligibility.   [facility] will update family once bed availability has been confirmed.   Wallis Bamberg RN, BSN, CCRN  [facility] Liaison

## 2019-11-11 NOTE — Progress Notes (Signed)
This note also relates to the following rows which could not be included: ECG Heart Rate - Cannot attach notes to unvalidated device data Resp - Cannot attach notes to unvalidated device data    11/11/19 0353  PAINAD (Pain Assessment in Advanced Dementia)  Breathing 2  Negative Vocalization 2  Facial Expression 2  Body Language 2  Consolability 2  PAINAD Score 10   Premedicate prior dressing change

## 2019-11-11 NOTE — Progress Notes (Signed)
Wound care provided at 5am this day. Measurements were not recorded due to patients' inability to tolerate. Will continue to monitor.

## 2019-11-12 LAB — BASIC METABOLIC PANEL
Anion gap: 8 (ref 5–15)
BUN: 26 mg/dL — ABNORMAL HIGH (ref 8–23)
CO2: 23 mmol/L (ref 22–32)
Calcium: 7.7 mg/dL — ABNORMAL LOW (ref 8.9–10.3)
Chloride: 103 mmol/L (ref 98–111)
Creatinine, Ser: 0.6 mg/dL (ref 0.44–1.00)
GFR calc Af Amer: >60 mL/min (ref >60)
GFR calc non Af Amer: >60 mL/min (ref >60)
Glucose, Bld: 220 mg/dL — ABNORMAL HIGH (ref 70–99)
Potassium: 3.9 mmol/L (ref 3.5–5.1)
Sodium: 134 mmol/L — ABNORMAL LOW (ref 135–145)

## 2019-11-12 LAB — CBC
HCT: 30 % — ABNORMAL LOW (ref 36.0–46.0)
Hemoglobin: 9.6 g/dL — ABNORMAL LOW (ref 12.0–15.0)
MCH: 26.2 pg (ref 26.0–34.0)
MCHC: 32 g/dL (ref 30.0–36.0)
MCV: 82 fL (ref 80.0–100.0)
Platelets: 469 10*3/uL — ABNORMAL HIGH (ref 150–400)
RBC: 3.66 MIL/uL — ABNORMAL LOW (ref 3.87–5.11)
RDW: 17.1 % — ABNORMAL HIGH (ref 11.5–15.5)
WBC: 19.5 10*3/uL — ABNORMAL HIGH (ref 4.0–10.5)
nRBC: 0 % (ref 0.0–0.2)

## 2019-11-12 LAB — PHOSPHORUS: Phosphorus: 1.4 mg/dL — ABNORMAL LOW (ref 2.5–4.6)

## 2019-11-12 LAB — GLUCOSE, CAPILLARY
Glucose-Capillary: <10 mg/dL — CL (ref 70–99)
Glucose-Capillary: 145 mg/dL — ABNORMAL HIGH (ref 70–99)
Glucose-Capillary: 255 mg/dL — ABNORMAL HIGH (ref 70–99)

## 2019-11-12 LAB — MAGNESIUM: Magnesium: 1.5 mg/dL — ABNORMAL LOW (ref 1.7–2.4)

## 2019-11-12 MED ORDER — POTASSIUM PHOSPHATES 15 MMOLE/5ML IV SOLN
20.0000 mmol | Freq: Once | INTRAVENOUS | Status: DC
  Filled 2019-11-12: qty 6.67

## 2019-11-12 MED ORDER — POTASSIUM PHOSPHATES 15 MMOLE/5ML IV SOLN
30.0000 mmol | Freq: Once | INTRAVENOUS | Status: AC
  Administered 2019-11-12: 30 mmol via INTRAVENOUS
  Filled 2019-11-12: qty 10

## 2019-11-12 MED ORDER — MAGNESIUM SULFATE 2 GM/50ML IV SOLN
2.0000 g | Freq: Once | INTRAVENOUS | Status: AC
  Administered 2019-11-12: 2 g via INTRAVENOUS
  Filled 2019-11-12: qty 50

## 2019-11-12 MED ORDER — COLLAGENASE 250 UNIT/GM EX OINT: TOPICAL_OINTMENT | Freq: Every day | CUTANEOUS | 0 refills | Status: AC

## 2019-11-12 NOTE — Progress Notes (Signed)
Report called to [facility] at this time.  Facility requests that the patient's IV remain in place.

## 2019-11-12 NOTE — Discharge Instructions (Signed)
Patient is being transferred to [facility] for hospice/ comfort care

## 2019-11-12 NOTE — Discharge Summary (Addendum)
Physician Discharge Summary  Malaka Ruffner MOQ:[id number] DOB: 01-25-1951 DOA: 11/05/2019  PCP: Eloisa Northern, MD  Admit date: 11/05/2019   Discharge date: 11/12/2019  Admitted From:  SNF.  Disposition: [facility] facility  Recommendations for Outpatient Follow-up:  1. Follow up with PCP in 1-2 weeks 2. Please obtain BMP/CBC in one week 3. Patient is being transferred to [facility] for hospice care.  Home Health: No  Equipment/Devices: None  Discharge Condition: Fair CODE STATUS:DNR Diet recommendation: Mechanical soft  Brief Summary /  Hospital course: Patient is a 69 year old female with history of Alzheimer's dementia, coronaryarterydisease, hypertension, DVT , type 2 diabetes mellitus, hyperlipidemia who was sent from nursing facility for the evaluation of altered mental status. Nursing staff at [facility] health facility noticed change in her mental status and also increased pain medication.She has sacral wound and also ulcers on the left lower extremity and right heel. She follows with wound care as an outpatient .On presentation she had mild grade fever, tachycardic, initially hypotensive with significant leukocytosis, lactate of 2. CT abdomen showed right posterior sacral decubitus ulcer without abscess or evidence of osteomyelitis. There was concern for cellulitis. She was admitted for management of possible infected decubitusulcers. Altered mental status has improved, she was getting IV antibiotics for infection.  She continues to have leukocytosis,  Infectious disease consulted suggested consider palliative care and orthopedics consult. X-ray: Nonspecific lucency in the dorsal calcaneus could be due to osteomyelitis. Orthopedics was consulted, recommended plastic surgery for sacral wound and wound care. Patient is getting pain medications for pain control.  She screams in pain even with minimal movement, or wound care. Palliative care consulted,  family meeting has  arranged.  Daughter is agreeable to residential hospice, awaiting approval.  Patient got accepted at [facility] for hospice care.  IV antibiotics discontinued.  Discharge Diagnoses:  Principal Problem:   Sepsis (HCC) Active Problems:   Stage IV pressure ulcer of sacral region (HCC)   Wound of lower extremity, initial encounter  She was managed for below problems.  Suspected sepsis:Secondary to cellulitis of the sacral decubitus wound. Started on vancomycin, Rocephin. Follow-up cultures. Wound culture showed mixed organisms.Continue current antibiotics. Wound care consulted. She has severe leukocytosis which remains elevated. Monitor CBC.  Infectious disease consulted, suggested continue ceftriaxone and add Flagyl. Recommended palliative care consult to discuss goals of care.  Also recommended Ortho consult.  Hypotension: Currently normotensive. She responded to IV fluids. Metoprolol was on hold, now resumed as BP spikes. There was concern for sepsis. Continue gentle IV fluids, she looks dehydrated.  Extensive pressure ulcers: She has sacral decubitus ulcer, ulcer on the lateral lower extremity, right heelulcer.Continue wound care.  Ortho consulted, recommended no surgical intervention at this point.  Acute metabolic encephalopathyon the background of dementia: Currently she is oriented x 2. Alert and awake and follows commands. As per the daughter, she is more alert and oriented today.. Gabapentin and trazodone on hold. She is also on lot of pain meds which cud have contributed to increased lethargy/weakness/confusion.Currently she is at her baseline mental status.  Normocytic anemia:Low iron. Continue oral iron supplementation on discharge.  Mild transaminitis:Improved  Insulin-dependent diabetes type 2: On insulin at baseline. Continue current insulin regimen.  History of DVT: Previously on Eliquis and Xarelto but not on any anticoagulation  currently.  Chronic pain syndrome: Continue pain medications.  She reports in a lot of pain Dilaudid 0.5 mg IV every 6 hours added for pain control   Discharge Instructions  Discharge Instructions  Call MD for:  difficulty breathing, headache or visual disturbances   Complete by: As directed    Call MD for:  persistant nausea and vomiting   Complete by: As directed    Call MD for:  severe uncontrolled pain   Complete by: As directed    Diet - low sodium heart healthy   Complete by: As directed    Discharge instructions   Complete by: As directed    Patient is being transferred to [facility] for comfort care.   Discharge wound care:   Complete by: As directed    Patient has unstageable stage IV decubitus ulcers needs wound care   Increase activity slowly   Complete by: As directed      Allergies as of 11/12/2019      Reactions   Penicillins Hives   Can tolerate cephalosporins      Medication List    STOP taking these medications   metroNIDAZOLE 500 MG tablet Commonly known as: FLAGYL   morphine 15 MG 12 hr tablet Commonly known as: MS CONTIN   morphine 30 MG 24 hr capsule Commonly known as: KADIAN   potassium chloride SA 20 MEQ tablet Commonly known as: KLOR-CON   rivaroxaban 20 MG Tabs tablet Commonly known as: XARELTO     TAKE these medications   acetaminophen 325 MG tablet Commonly known as: TYLENOL Take 650 mg by mouth every 4 (four) hours as needed for mild pain.   allopurinol 100 MG tablet Commonly known as: ZYLOPRIM Take 100 mg by mouth 2 (two) times daily.   apixaban 5 MG Tabs tablet Commonly known as: ELIQUIS Take 1 tablet (5 mg total) by mouth 2 (two) times daily. To be started 06/04/2018.   atorvastatin 80 MG tablet Commonly known as: LIPITOR Take 80 mg by mouth daily.   collagenase ointment Commonly known as: SANTYL Apply topically daily. Start taking on: November 13, 2019   docusate sodium 100 MG capsule Commonly known as:  COLACE Take 100 mg by mouth at bedtime.   feeding supplement (GLUCERNA SHAKE) Liqd Take 237 mLs by mouth 3 (three) times daily between meals. What changed: when to take this   gabapentin 300 MG capsule Commonly known as: NEURONTIN Take 1 capsule (300 mg total) by mouth 2 (two) times daily. What changed: when to take this   insulin aspart 100 UNIT/ML injection Commonly known as: novoLOG Inject 0-9 Units into the skin 3 (three) times daily with meals. CBG < 70: implement hypoglycemia protocol CBG 70 - 120: 0 units CBG 121 - 150: 1 unit CBG 151 - 200: 2 units CBG 201 - 250: 3 units CBG 251 - 300: 5 units CBG 301 - 350: 7 units CBG 351 - 400: 9 units CBG > 400: call MD.   insulin glargine (1 Unit Dial) 300 UNIT/ML Solostar Pen Commonly known as: Toujeo SoloStar Inject 35 Units into the skin daily. What changed: how much to take   melatonin 3 MG Tabs tablet Take 1 tablet by mouth at bedtime.   metoprolol succinate 50 MG 24 hr tablet Commonly known as: TOPROL-XL Take 50 mg by mouth daily.   multivitamin with minerals Tabs tablet Take 1 tablet by mouth daily.   nystatin 100000 UNIT/ML suspension Commonly known as: MYCOSTATIN Take 5 mLs (500,000 Units total) by mouth 4 (four) times daily.   omeprazole 20 MG capsule Commonly known as: PRILOSEC Take 20 mg by mouth daily.   ondansetron 4 MG tablet Commonly known as: ZOFRAN Take  1 tablet (4 mg total) by mouth every 6 (six) hours as needed for nausea.   oxyCODONE-acetaminophen 10-325 MG tablet Commonly known as: PERCOCET Take 1 tablet by mouth every 6 (six) hours as needed for pain (Moderate - Severe pain.). What changed:   when to take this  reasons to take this   Ozempic (1 MG/DOSE) 2 MG/1.5ML Sopn Generic drug: Semaglutide (1 MG/DOSE) Inject 1 mg into the skin once a week. Friday   polyethylene glycol 17 g packet Commonly known as: MiraLax Take 17 g by mouth daily.   PROSTAT PO Take 30 mLs by mouth in the  morning, at noon, and at bedtime.   sertraline 50 MG tablet Commonly known as: ZOLOFT Take 50 mg by mouth daily.   sodium hypochlorite 0.125 % Soln Commonly known as: DAKIN'S 1/4 STRENGTH Apply 1 application topically in the morning and at bedtime. Clean area to sacrum with normal saline pat dry pack with dakin's soaked gauzes and crushed flagyl 500 mg, cover.   traZODone 50 MG tablet Commonly known as: DESYREL Take 50 mg by mouth at bedtime.            Discharge Care Instructions  (From admission, onward)         Start     Ordered   11/12/19 0000  Discharge wound care:       Comments: Patient has unstageable stage IV decubitus ulcers needs wound care   11/12/19 1115          Follow-up Information    Eloisa Northern, MD Follow up in 1 week(s).   Specialty: Internal Medicine Contact information: [address] [place] [address] [phone]              Allergies  Allergen Reactions  . Penicillins Hives    Can tolerate cephalosporins    Consultations:  Infectious disease, orthopedics, wound care, palliative care.   Procedures/Studies: DG Chest 1 View  Result Date: 11/05/2019 CLINICAL DATA:  Skin infection. EXAM: CHEST  1 VIEW COMPARISON:  June 01, 2019. FINDINGS: The heart size and mediastinal contours are within normal limits. Both lungs are clear. The visualized skeletal structures are unremarkable. IMPRESSION: No active disease. Electronically Signed   By: Lupita Raider M.D.   On: 11/05/2019 17:17   DG Tibia/Fibula Left  Result Date: 11/05/2019 CLINICAL DATA:  Skin infection. EXAM: LEFT TIBIA AND FIBULA - 2 VIEW COMPARISON:  None. FINDINGS: There is no evidence of fracture or other focal bone lesions. Soft tissues are unremarkable. IMPRESSION: Negative. Electronically Signed   By: Lupita Raider M.D.   On: 11/05/2019 17:19   CT Head Wo Contrast  Result Date: 11/05/2019 CLINICAL DATA:  Altered mental status, unclear cause EXAM: CT HEAD WITHOUT  CONTRAST TECHNIQUE: Contiguous axial images were obtained from the base of the skull through the vertex without intravenous contrast. COMPARISON:  None. FINDINGS: Brain: No evidence of acute infarction, hemorrhage, hydrocephalus, extra-axial collection or mass lesion/mass effect. Symmetric prominence of the cisterns and sulci compatible with parenchymal volume loss. There is pronounced ventriculomegaly out of proportion to the degree of sulcal dilatation of the lateral and third ventricles with a narrowed callosal septal angle and upward bowing of the tentorium which can be seen in the setting of normal pressure hydrocephalus. Patchy areas of white matter hypoattenuation are most compatible with chronic microvascular angiopathy. Vascular: No hyperdense vessel or unexpected calcification. Skull: No calvarial fracture or suspicious osseous lesion. No scalp swelling or hematoma. Sinuses/Orbits:  Paranasal sinuses and mastoid air cells are predominantly clear. Included orbital structures are unremarkable. Other: None IMPRESSION: 1. No acute intracranial abnormality. 2. Ventriculomegaly is slightly more pronounced in the volume loss seen elsewhere with additional features which can be seen in the setting of normal pressure hydrocephalus. Correlate with clinical exam findings. 3. Chronic microvascular angiopathy and parenchymal volume loss. Electronically Signed   By: Kreg Shropshire M.D.   On: 11/05/2019 19:00   CT ABDOMEN PELVIS W CONTRAST  Result Date: 11/05/2019 CLINICAL DATA:  Diffuse skin defect shin, decreased mental status and mobility EXAM: CT ABDOMEN AND PELVIS WITH CONTRAST TECHNIQUE: Multidetector CT imaging of the abdomen and pelvis was performed using the standard protocol following bolus administration of intravenous contrast. CONTRAST:  OMNIPAQUE IOHEXOL 300 MG/ML  SOLN COMPARISON:  None. FINDINGS: Lower chest: Lung bases are clear. Normal heart size. No pericardial effusion. Coronary artery  calcifications are present. Hepatobiliary: No worrisome focal liver abnormality is seen. Normal gallbladder. No visible calcified gallstones. No biliary ductal dilatation. Pancreas: Unremarkable. No pancreatic ductal dilatation or surrounding inflammatory changes. Spleen: Normal in size without focal abnormality. Adrenals/Urinary Tract: Some lobular thickening of the adrenal glands without discerning nodule, can reflect senescent change. Kidneys enhance and excrete symmetrically. There are bilateral areas of cortical scarring. No concerning renal masses. No urolithiasis or hydronephrosis. Mild bilateral symmetric perinephric stranding, a nonspecific finding though may correlate with either age or decreased renal function. Mild circumferential bladder wall thickening. Stomach/Bowel: Distal esophagus, stomach and duodenal sweep are unremarkable. No small bowel wall thickening or dilatation. No evidence of obstruction. A normal appendix is visualized. No colonic dilatation or wall thickening. Vascular/Lymphatic: Atherosclerotic calcifications throughout the abdominal aorta and branch vessels. No aneurysm or ectasia. No enlarged abdominopelvic lymph nodes. Reproductive: Fibroid uterus.  No concerning adnexal lesions. Other: Skin thickening and ulceration superficial to the right posterior sacrum with small amount subcutaneous gas and overlying skin thickening and as well as phlegmonous stranding. No organized collection or abscess is seen. Mild diffuse skin thickening of the posterior body wall and gluteal soft tissues. Correlate for features of cellulitis. No abdominopelvic free air or fluid. No bowel containing hernias. Tiny supraumbilical fat containing ventral hernia. Musculoskeletal: Acute to subacute fracture of the fifth sacral segment. This is best demonstrated on sagittal imaging 6/87 where abrupt cortical angulation with transcortical lucency is noted. No convincing early CT features of osteomyelitis subjacent  to the sacral decubitus ulceration. There is however in irregular area of cortical erosive change involving left femoral neck with some surrounding synovitis (5/59). Findings on a background of more diffuse degenerative changes of the spine hips and pelvis. Partial ankylosis at the lumbosacral junction. IMPRESSION: 1. Skin thickening and ulceration superficial to the right posterior sacrum with small amount subcutaneous gas compatible with sacral decubitus ulceration. Base of this ulceration is just superficial to the sacral bone without organized collection, abscess or CT evidence of osteomyelitis. 2. Additional mild diffuse skin thickening of the posterior body wall and gluteal soft tissues. Correlate for features of cellulitis. 3. Irregular area of cortical erosive lucency involving left femoral neck with some surrounding synovitis, correlate for clinical symptoms as a septic joint is not excluded. 4. Acute to subacute appearing fracture of the fifth sacral segment. 5. Mild circumferential bladder could reflect a cystitis. Correlate with urinalysis findings. 6. Aortic Atherosclerosis (ICD10-I70.0). Electronically Signed   By: Kreg Shropshire M.D.   On: 11/05/2019 19:12   DG Ankle Left Port  Result Date: 11/09/2019 CLINICAL DATA:  Multiple  skin ulcerations. EXAM: PORTABLE LEFT ANKLE - 2 VIEW COMPARISON:  None. FINDINGS: There appears to be a skin ulceration along the lateral malleolus. Bones are osteopenic. Dorsal calcaneal spurring is seen. There is lucency in the posterior, superior calcaneus which is unchanged since the prior exam. Soft tissues are swollen. No soft tissue gas or radiopaque foreign body. IMPRESSION: Nonspecific lucency in the dorsal calcaneus could be due to osteomyelitis. No skin ulceration is seen in this location. Soft tissue swelling.  Negative for soft tissue gas. Osteopenia. Electronically Signed   By: Drusilla Kanner M.D.   On: 11/09/2019 15:59      Subjective: Patient was seen and  examined at bedside.  No overnight events.  Patient appears at her baseline.  Patient screams in a lot of pain even with minimal movement.  Daughter is at bedside, explained in detail about overall prognosis and agreeable with the hospice care.  Patient is being transferred to [facility] facility.  Discharge Exam: Vitals:   11/12/19 0500 11/12/19 0827  BP: (!) 150/80   Pulse: 84   Resp: 17   Temp: 98.7 F (37.1 C)   SpO2: 94% 96%   Vitals:   11/11/19 1430 11/11/19 2009 11/12/19 0500 11/12/19 0827  BP: 111/71 (!) 149/81 (!) 150/80   Pulse: 74 80 84   Resp: 15 19 17    Temp: 98 F (36.7 C) 98.1 F (36.7 C) 98.7 F (37.1 C)   TempSrc: Oral     SpO2: 100% 100% 94% 96%  Weight:      Height:        General: Pt is alert, awake, not in acute distress Cardiovascular: RRR, S1/S2 +, no rubs, no gallops Respiratory: CTA bilaterally, no wheezing, no rhonchi Abdominal: Soft, NT, ND, bowel sounds + Extremities: Open wound. Skin: Sacral ulcer, bilateral lower extremity ulcers   The results of significant diagnostics from this hospitalization (including imaging, microbiology, ancillary and laboratory) are listed below for reference.     Microbiology: Recent Results (from the past 240 hour(s))  Aerobic Culture (superficial specimen)     Status: None   Collection Time: 11/05/19  3:54 PM   Specimen: Wound  Result Value Ref Range Status   Specimen Description   Final    WOUND RIGHT SHIN Performed at [facility], [address], [place], [place] [address]    Special Requests   Final    NONE Performed at [facility], [address] [place], [place] [address]    Gram Stain   Final    NO WBC SEEN FEW GRAM POSITIVE COCCI IN PAIRS RARE GRAM NEGATIVE RODS Performed at [facility], [address], [place], [place] [address]    Culture   Final    FEW PROTEUS MIRABILIS FEW ESCHERICHIA COLI FEW STAPHYLOCOCCUS AUREUS    Report  Status 11/08/2019 FINAL  Final   Organism ID, Bacteria PROTEUS MIRABILIS  Final   Organism ID, Bacteria ESCHERICHIA COLI  Final   Organism ID, Bacteria STAPHYLOCOCCUS AUREUS  Final      Susceptibility   Escherichia coli - MIC*    AMPICILLIN >=32 RESISTANT Resistant     CEFAZOLIN <=4 SENSITIVE Sensitive     CEFEPIME <=0.12 SENSITIVE Sensitive     CEFTAZIDIME <=1 SENSITIVE Sensitive     CEFTRIAXONE <=0.25 SENSITIVE Sensitive     CIPROFLOXACIN <=0.25 SENSITIVE Sensitive     GENTAMICIN <=1 SENSITIVE Sensitive     IMIPENEM <=0.25 SENSITIVE Sensitive  TRIMETH/SULFA <=20 SENSITIVE Sensitive     AMPICILLIN/SULBACTAM 4 SENSITIVE Sensitive     PIP/TAZO <=4 SENSITIVE Sensitive     * FEW ESCHERICHIA COLI   Proteus mirabilis - MIC*    AMPICILLIN <=2 SENSITIVE Sensitive     CEFAZOLIN <=4 SENSITIVE Sensitive     CEFEPIME <=0.12 SENSITIVE Sensitive     CEFTAZIDIME <=1 SENSITIVE Sensitive     CEFTRIAXONE <=0.25 SENSITIVE Sensitive     CIPROFLOXACIN <=0.25 SENSITIVE Sensitive     GENTAMICIN <=1 SENSITIVE Sensitive     IMIPENEM 2 SENSITIVE Sensitive     TRIMETH/SULFA <=20 SENSITIVE Sensitive     AMPICILLIN/SULBACTAM <=2 SENSITIVE Sensitive     PIP/TAZO <=4 SENSITIVE Sensitive     * FEW PROTEUS MIRABILIS   Staphylococcus aureus - MIC*    CIPROFLOXACIN <=0.5 SENSITIVE Sensitive     ERYTHROMYCIN <=0.25 SENSITIVE Sensitive     GENTAMICIN <=0.5 SENSITIVE Sensitive     OXACILLIN 0.5 SENSITIVE Sensitive     TETRACYCLINE <=1 SENSITIVE Sensitive     VANCOMYCIN <=0.5 SENSITIVE Sensitive     TRIMETH/SULFA <=10 SENSITIVE Sensitive     CLINDAMYCIN <=0.25 SENSITIVE Sensitive     RIFAMPIN <=0.5 SENSITIVE Sensitive     Inducible Clindamycin NEGATIVE Sensitive     * FEW STAPHYLOCOCCUS AUREUS  Aerobic Culture (superficial specimen)     Status: Abnormal   Collection Time: 11/05/19  3:54 PM   Specimen: Wound  Result Value Ref Range Status   Specimen Description   Final    WOUND SACRAL Performed at  [facility], [address], [place], [place] [address]    Special Requests   Final    NONE Performed at [facility], [address] [place], [place] [address]    Gram Stain   Final    NO WBC SEEN FEW GRAM POSITIVE COCCI IN PAIRS FEW GRAM NEGATIVE RODS    Culture (A)  Final    MULTIPLE ORGANISMS PRESENT, NONE PREDOMINANT NO STAPHYLOCOCCUS AUREUS ISOLATED NO GROUP A STREP (S.PYOGENES) ISOLATED Performed at [facility], [address] [place], [place] [address]    Report Status 11/08/2019 FINAL  Final  Blood culture (routine x 2)     Status: None   Collection Time: 11/05/19  6:00 PM   Specimen: BLOOD  Result Value Ref Range Status   Specimen Description   Final    BLOOD RIGHT WRIST Performed at [facility], [address] [place], [place] [address]    Special Requests   Final    BOTTLES DRAWN AEROBIC AND ANAEROBIC Blood Culture adequate volume Performed at [facility], [address], [place], [place] [address]    Culture   Final    NO GROWTH 5 DAYS Performed at [facility], [address] [place], [place] [address]    Report Status 11/10/2019 FINAL  Final  SARS Coronavirus 2 by RT PCR (hospital order, performed in [facility] hospital lab) Nasopharyngeal Nasopharyngeal Swab     Status: None   Collection Time: 11/05/19  9:00 PM   Specimen: Nasopharyngeal Swab  Result Value Ref Range Status   SARS Coronavirus 2 NEGATIVE NEGATIVE Final    Comment: (NOTE) SARS-CoV-2 target nucleic acids are NOT DETECTED.  The SARS-CoV-2 RNA is generally detectable in upper and lower respiratory specimens during the acute phase of infection. The lowest concentration of SARS-CoV-2 viral copies this assay can detect is 250 copies / mL. A negative result does  not preclude SARS-CoV-2 infection and should not be used as the sole basis for treatment or other patient management decisions.  A negative  result may occur with improper specimen collection / handling, submission of specimen other than nasopharyngeal swab, presence of viral mutation(s) within the areas targeted by this assay, and inadequate number of viral copies (<250 copies / mL). A negative result must be combined with clinical observations, patient history, and epidemiological information.  Fact Sheet for Patients:   BoilerBrush.com.cyhttps://www.fda.gov/media/136312/download  Fact Sheet for Healthcare Providers: https://pope.com/https://www.fda.gov/media/136313/download  This test is not yet approved or  cleared by the [place] FDA and has been authorized for detection and/or diagnosis of SARS-CoV-2 by FDA under an Emergency Use Authorization (EUA).  This EUA will remain in effect (meaning this test can be used) for the duration of the COVID-19 declaration under Section 564(b)(1) of the Act, 21 U.S.C. section 360bbb-3(b)(1), unless the authorization is terminated or revoked sooner.  Performed at [facility], [address] [place], [place] [address]   Urine culture     Status: Abnormal   Collection Time: 11/06/19  8:34 AM   Specimen: Urine, Random  Result Value Ref Range Status   Specimen Description   Final    URINE, RANDOM Performed at [facility], [address] [place], [place] [address]    Special Requests   Final    NONE Performed at [facility], [address] [place], [place] [address]    Culture MULTIPLE SPECIES PRESENT, SUGGEST RECOLLECTION (A)  Final   Report Status 11/07/2019 FINAL  Final  MRSA PCR Screening     Status: None   Collection Time: 11/06/19  4:24 PM   Specimen: Nasopharyngeal  Result Value Ref Range Status   MRSA by PCR NEGATIVE NEGATIVE Final    Comment:        The GeneXpert MRSA Assay (FDA approved for NASAL specimens only), is one component of a comprehensive MRSA colonization surveillance program. It is not intended to diagnose  MRSA infection nor to guide or monitor treatment for MRSA infections. Performed at [facility], [address] [place], [place] [address]   Culture, Urine     Status: Abnormal   Collection Time: 11/08/19 12:30 PM   Specimen: Urine, Clean Catch  Result Value Ref Range Status   Specimen Description   Final    URINE, CLEAN CATCH Performed at [facility], [address] [place], [place] [address]    Special Requests   Final    NONE Performed at [facility], [address] [place], [place] [address]    Culture (A)  Final    <10,000 COLONIES/mL INSIGNIFICANT GROWTH Performed at [facility], [address] [place], [place] [address]    Report Status 11/09/2019 FINAL  Final     Labs: BNP (last 3 results) No results for input(s): BNP in the last 8760 hours. Basic Metabolic Panel: Recent Labs  Lab 11/08/19 0410 11/09/19 0425 11/10/19 0328 11/11/19 0328 11/12/19 0323  NA 133* 128* 133* 133* 134*  K 3.3* 3.4* 3.6 3.7 3.9  CL 103 100 102 102 103  CO2 22 20* 22 23 23   GLUCOSE 162* 115* 81 93 220*  BUN 11 9 15 21  26*  CREATININE 0.51 0.47 0.38* 0.50 0.60  CALCIUM 7.7* 7.6* 8.0* 7.9* 7.7*  MG  --  1.2* 1.6* 1.7 1.5*  PHOS  --  1.6* 2.0* 1.6* 1.4*  Liver Function Tests: Recent Labs  Lab 11/05/19 1717 11/06/19 0414 11/09/19 0425  AST 46* 36 22  ALT 53* 47* 30  ALKPHOS 113 101 102  BILITOT 0.5 0.5 0.6  PROT 7.4 6.7 5.8*  ALBUMIN 2.2* 2.1* 2.0*   No results for input(s): LIPASE, AMYLASE in the last 168 hours. No results for input(s): AMMONIA in the last 168 hours. CBC: Recent Labs  Lab 11/05/19 1717 11/06/19 0414 11/07/19 0327 11/07/19 0327 11/08/19 0410 11/09/19 0425 11/10/19 0328 11/11/19 0328 11/12/19 0323  WBC 19.9*   < > 20.6*   < > 19.2* 24.3* 23.1* 17.6* 19.5*  NEUTROABS 16.3*  --  16.1*  --   --   --   --   --   --   HGB 10.4*   < > 8.9*   < > 9.1* 9.6* 9.8* 11.7* 9.6*  HCT 33.3*    < > 27.9*   < > 28.1* 30.1* 30.2* 36.2 30.0*  MCV 84.5   < > 82.8   < > 83.4 82.0 81.0 80.8 82.0  PLT 371   < > 266   < > 308 354 376 381 469*   < > = values in this interval not displayed.   Cardiac Enzymes: No results for input(s): CKTOTAL, CKMB, CKMBINDEX, TROPONINI in the last 168 hours. BNP: Invalid input(s): POCBNP CBG: Recent Labs  Lab 11/11/19 1652 11/11/19 1700 11/11/19 1722 11/11/19 2015 11/12/19 0744  GLUCAP <10* 179* 195* 220* 145*   D-Dimer No results for input(s): DDIMER in the last 72 hours. Hgb A1c No results for input(s): HGBA1C in the last 72 hours. Lipid Profile No results for input(s): CHOL, HDL, LDLCALC, TRIG, CHOLHDL, LDLDIRECT in the last 72 hours. Thyroid function studies No results for input(s): TSH, T4TOTAL, T3FREE, THYROIDAB in the last 72 hours.  Invalid input(s): FREET3 Anemia work up No results for input(s): VITAMINB12, FOLATE, FERRITIN, TIBC, IRON, RETICCTPCT in the last 72 hours. Urinalysis    Component Value Date/Time   COLORURINE AMBER (A) 11/06/2019 0834   APPEARANCEUR TURBID (A) 11/06/2019 0834   LABSPEC 1.036 (H) 11/06/2019 0834   PHURINE 5.0 11/06/2019 0834   GLUCOSEU NEGATIVE 11/06/2019 0834   HGBUR MODERATE (A) 11/06/2019 0834   BILIRUBINUR NEGATIVE 11/06/2019 0834   KETONESUR NEGATIVE 11/06/2019 0834   PROTEINUR 30 (A) 11/06/2019 0834   NITRITE POSITIVE (A) 11/06/2019 0834   LEUKOCYTESUR LARGE (A) 11/06/2019 0834   Sepsis Labs Invalid input(s): PROCALCITONIN,  WBC,  LACTICIDVEN Microbiology Recent Results (from the past 240 hour(s))  Aerobic Culture (superficial specimen)     Status: None   Collection Time: 11/05/19  3:54 PM   Specimen: Wound  Result Value Ref Range Status   Specimen Description   Final    WOUND RIGHT SHIN Performed at [facility], [address], [place], [place] [address]    Special Requests   Final    NONE Performed at [facility], [address],  [place], [place] [address]    Gram Stain   Final    NO WBC SEEN FEW GRAM POSITIVE COCCI IN PAIRS RARE GRAM NEGATIVE RODS Performed at [facility], [address] [place], [place] [address]    Culture   Final    FEW PROTEUS MIRABILIS FEW ESCHERICHIA COLI FEW STAPHYLOCOCCUS AUREUS    Report Status 11/08/2019 FINAL  Final   Organism ID, Bacteria PROTEUS MIRABILIS  Final   Organism ID, Bacteria ESCHERICHIA COLI  Final   Organism  ID, Bacteria STAPHYLOCOCCUS AUREUS  Final      Susceptibility   Escherichia coli - MIC*    AMPICILLIN >=32 RESISTANT Resistant     CEFAZOLIN <=4 SENSITIVE Sensitive     CEFEPIME <=0.12 SENSITIVE Sensitive     CEFTAZIDIME <=1 SENSITIVE Sensitive     CEFTRIAXONE <=0.25 SENSITIVE Sensitive     CIPROFLOXACIN <=0.25 SENSITIVE Sensitive     GENTAMICIN <=1 SENSITIVE Sensitive     IMIPENEM <=0.25 SENSITIVE Sensitive     TRIMETH/SULFA <=20 SENSITIVE Sensitive     AMPICILLIN/SULBACTAM 4 SENSITIVE Sensitive     PIP/TAZO <=4 SENSITIVE Sensitive     * FEW ESCHERICHIA COLI   Proteus mirabilis - MIC*    AMPICILLIN <=2 SENSITIVE Sensitive     CEFAZOLIN <=4 SENSITIVE Sensitive     CEFEPIME <=0.12 SENSITIVE Sensitive     CEFTAZIDIME <=1 SENSITIVE Sensitive     CEFTRIAXONE <=0.25 SENSITIVE Sensitive     CIPROFLOXACIN <=0.25 SENSITIVE Sensitive     GENTAMICIN <=1 SENSITIVE Sensitive     IMIPENEM 2 SENSITIVE Sensitive     TRIMETH/SULFA <=20 SENSITIVE Sensitive     AMPICILLIN/SULBACTAM <=2 SENSITIVE Sensitive     PIP/TAZO <=4 SENSITIVE Sensitive     * FEW PROTEUS MIRABILIS   Staphylococcus aureus - MIC*    CIPROFLOXACIN <=0.5 SENSITIVE Sensitive     ERYTHROMYCIN <=0.25 SENSITIVE Sensitive     GENTAMICIN <=0.5 SENSITIVE Sensitive     OXACILLIN 0.5 SENSITIVE Sensitive     TETRACYCLINE <=1 SENSITIVE Sensitive     VANCOMYCIN <=0.5 SENSITIVE Sensitive     TRIMETH/SULFA <=10 SENSITIVE Sensitive     CLINDAMYCIN <=0.25 SENSITIVE Sensitive     RIFAMPIN <=0.5 SENSITIVE  Sensitive     Inducible Clindamycin NEGATIVE Sensitive     * FEW STAPHYLOCOCCUS AUREUS  Aerobic Culture (superficial specimen)     Status: Abnormal   Collection Time: 11/05/19  3:54 PM   Specimen: Wound  Result Value Ref Range Status   Specimen Description   Final    WOUND SACRAL Performed at [facility], [address] [name], [place] [address]    Special Requests   Final    NONE Performed at [facility], [address] [place], [place] [address]    Gram Stain   Final    NO WBC SEEN FEW GRAM POSITIVE COCCI IN PAIRS FEW GRAM NEGATIVE RODS    Culture (A)  Final    MULTIPLE ORGANISMS PRESENT, NONE PREDOMINANT NO STAPHYLOCOCCUS AUREUS ISOLATED NO GROUP A STREP (S.PYOGENES) ISOLATED Performed at [facility], [address] [place], [place] [address]    Report Status 11/08/2019 FINAL  Final  Blood culture (routine x 2)     Status: None   Collection Time: 11/05/19  6:00 PM   Specimen: BLOOD  Result Value Ref Range Status   Specimen Description   Final    BLOOD RIGHT WRIST Performed at [facility], [address] [place], [place] [address]    Special Requests   Final    BOTTLES DRAWN AEROBIC AND ANAEROBIC Blood Culture adequate volume Performed at [facility], [address] [place], [place] [address]    Culture   Final    NO GROWTH 5 DAYS Performed at [facility], [address], [place], [place] [address]    Report Status 11/10/2019 FINAL  Final  SARS Coronavirus 2 by RT PCR (hospital order, performed in [facility] hospital lab) Nasopharyngeal Nasopharyngeal  Swab     Status: None   Collection Time: 11/05/19  9:00 PM   Specimen: Nasopharyngeal Swab  Result Value Ref Range Status   SARS Coronavirus 2 NEGATIVE NEGATIVE Final    Comment: (NOTE) SARS-CoV-2 target nucleic acids are NOT DETECTED.  The SARS-CoV-2 RNA is generally detectable in upper and lower respiratory  specimens during the acute phase of infection. The lowest concentration of SARS-CoV-2 viral copies this assay can detect is 250 copies / mL. A negative result does not preclude SARS-CoV-2 infection and should not be used as the sole basis for treatment or other patient management decisions.  A negative result may occur with improper specimen collection / handling, submission of specimen other than nasopharyngeal swab, presence of viral mutation(s) within the areas targeted by this assay, and inadequate number of viral copies (<250 copies / mL). A negative result must be combined with clinical observations, patient history, and epidemiological information.  Fact Sheet for Patients:   BoilerBrush.com.cy  Fact Sheet for Healthcare Providers: https://pope.com/  This test is not yet approved or  cleared by the [place] FDA and has been authorized for detection and/or diagnosis of SARS-CoV-2 by FDA under an Emergency Use Authorization (EUA).  This EUA will remain in effect (meaning this test can be used) for the duration of the COVID-19 declaration under Section 564(b)(1) of the Act, 21 U.S.C. section 360bbb-3(b)(1), unless the authorization is terminated or revoked sooner.  Performed at [facility], [address], [place], [place] [address]   Urine culture     Status: Abnormal   Collection Time: 11/06/19  8:34 AM   Specimen: Urine, Random  Result Value Ref Range Status   Specimen Description   Final    URINE, RANDOM Performed at [facility], [address], [place], [place] [address]    Special Requests   Final    NONE Performed at [facility], [address] [place], [place] [address]    Culture MULTIPLE SPECIES PRESENT, SUGGEST RECOLLECTION (A)  Final   Report Status 11/07/2019 FINAL  Final  MRSA PCR Screening     Status: None   Collection Time: 11/06/19  4:24 PM    Specimen: Nasopharyngeal  Result Value Ref Range Status   MRSA by PCR NEGATIVE NEGATIVE Final    Comment:        The GeneXpert MRSA Assay (FDA approved for NASAL specimens only), is one component of a comprehensive MRSA colonization surveillance program. It is not intended to diagnose MRSA infection nor to guide or monitor treatment for MRSA infections. Performed at [facility], [address], [place], [place] [address]   Culture, Urine     Status: Abnormal   Collection Time: 11/08/19 12:30 PM   Specimen: Urine, Clean Catch  Result Value Ref Range Status   Specimen Description   Final    URINE, CLEAN CATCH Performed at [facility], [address], [place], [place] [address]    Special Requests   Final    NONE Performed at [facility], [address], [place], [place] [address]    Culture (A)  Final    <10,000 COLONIES/mL INSIGNIFICANT GROWTH Performed at [facility], [address], [place], [place] [address]    Report Status 11/09/2019 FINAL  Final     Time coordinating discharge: Over 30 minutes  SIGNED:   Cipriano Bunker, MD  [facility] 11/12/2019, 11:18 AM   If 7PM-7AM,  please contact night-coverage [email or web address]

## 2019-11-12 NOTE — TOC Transition Note (Signed)
Transition of Care [facility]) - CM/SW Discharge Note   Patient Details  Name: Alyssa Haley MRN: [id number] Date of Birth: [birth year]  [facility] CM/SW Contact:  Ida Rogue, LCSW Phone Number: 11/12/2019, 9:54 AM   Clinical Narrative:   Patient to transfer to [facility] today.  PTAR arranged.  RN please call report to [phone].  TOC sign off.    Final next level of care: Hospice Medical Facility Barriers to Discharge: Barriers Resolved   Patient Goals and CMS Choice Patient states their goals for this hospitalization and ongoing recovery are:: to go back to [facility]      Discharge Placement                       Discharge Plan and Services   Discharge Planning Services: CM Consult Post Acute Care Choice: Resumption of Svcs/PTA Provider          DME Arranged: N/A DME Agency: NA       HH Arranged: NA HH Agency: NA        Social Determinants of Health (SDOH) Interventions     Readmission Risk Interventions Readmission Risk Prevention Plan 07/22/2018  Transportation Screening Complete  PCP or Specialist Appt within 5-7 Days Complete  Home Care Screening Complete  Medication Review (RN CM) Complete

## 2019-11-12 NOTE — Progress Notes (Signed)
[facility] ([facility]) Hospital Liaison note.    Received request from TOC manager for family interest in [facility]. Chart reviewed and eligibility confirmed. Spoke with family to confirm interest and explain services. Family agreeable to transfer today. TOC aware.     [facility] will notify TOC when registration paperwork has been completed to arrange transport.    RN please call report to [phone].   Thank you,      Mary Anne Robertson, RN, CCM       [facility] Hospital Liaison (listed on AMION under Hospice/[facility])     [phone]  

## 2020-01-06 DEATH — deceased

## 2021-10-16 IMAGING — CR DG CHEST 1V
1 series · 1 of 1 positions shown · non-contrast
Comparison: June 01, 2019.

CLINICAL DATA: Skin infection.

EXAM:
CHEST  1 VIEW

[x chest ap]
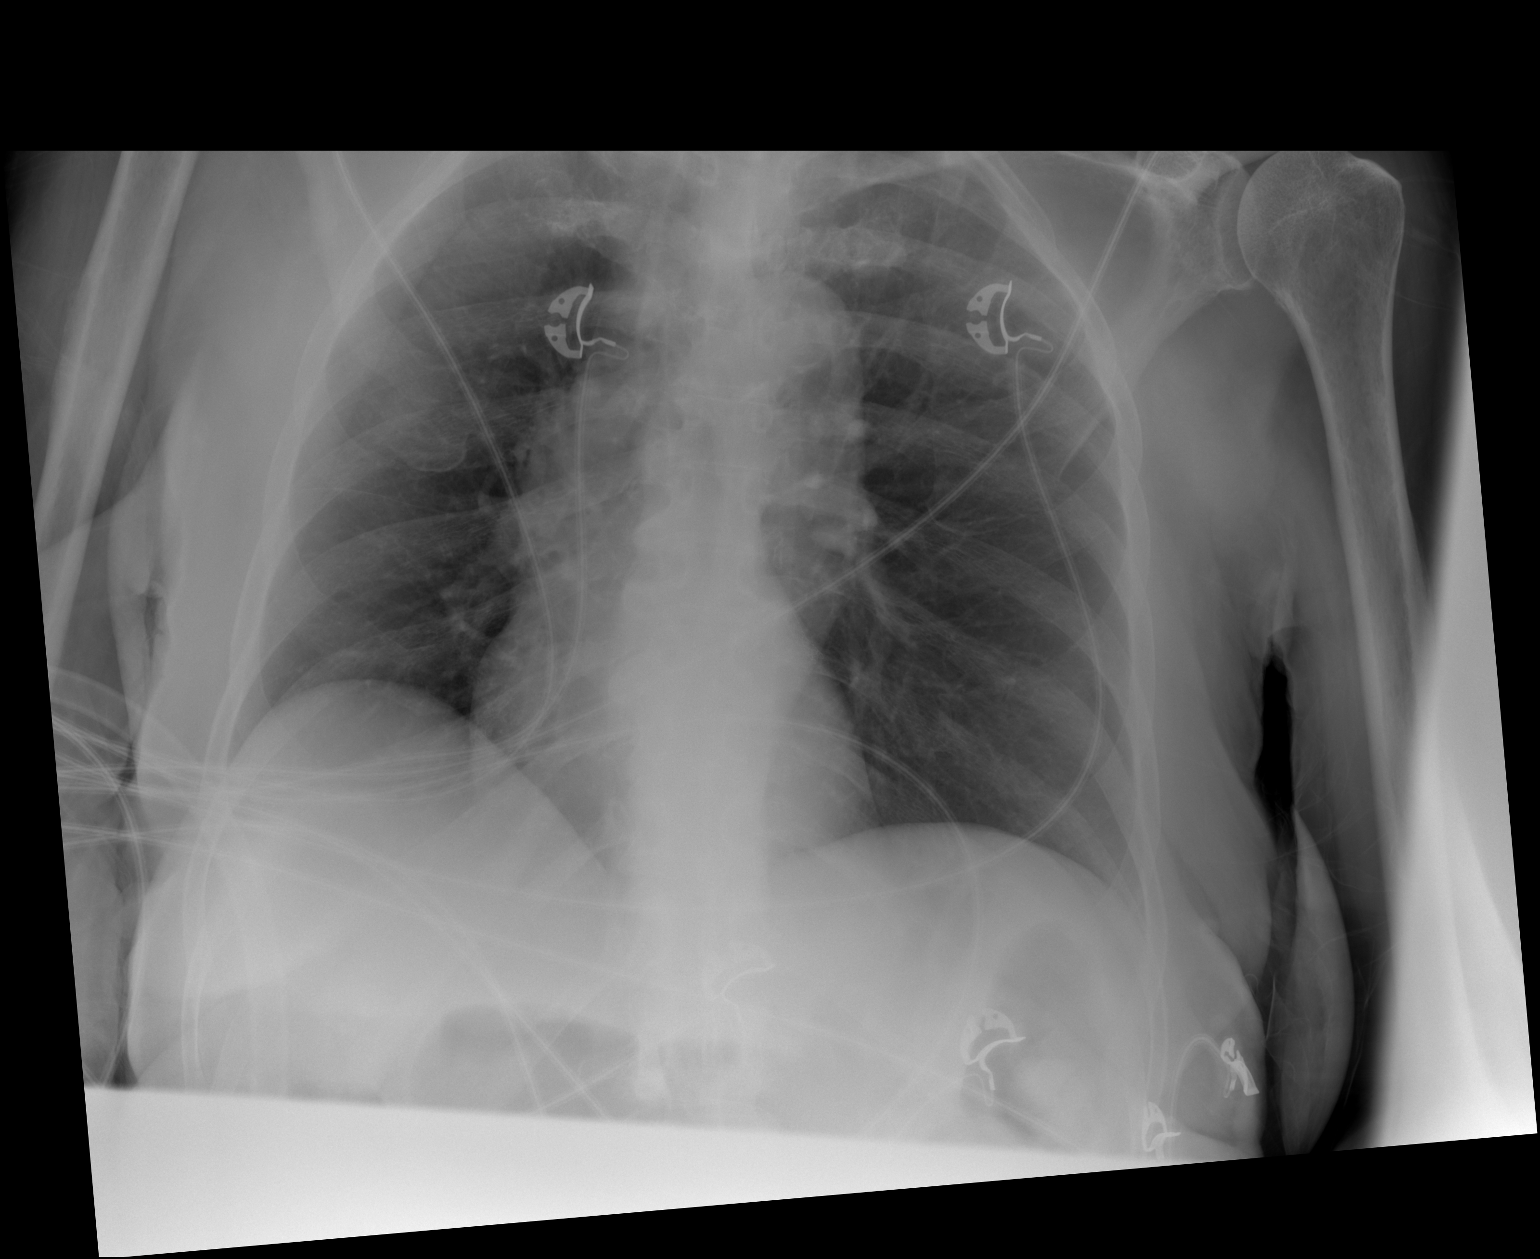

[1 of 1 positions shown; findings below may reference images not displayed]

FINDINGS: The heart size and mediastinal contours are within normal limits.
Both lungs are clear. The visualized skeletal structures are
unremarkable.
IMPRESSION: No active disease.

## 2021-10-20 IMAGING — DX DG ANKLE PORT 2V*L*
3 series · 3 of 3 positions shown · non-contrast
Comparison: None.

CLINICAL DATA: Multiple skin ulcerations.

EXAM:
PORTABLE LEFT ANKLE - 2 VIEW

[ankle ap]
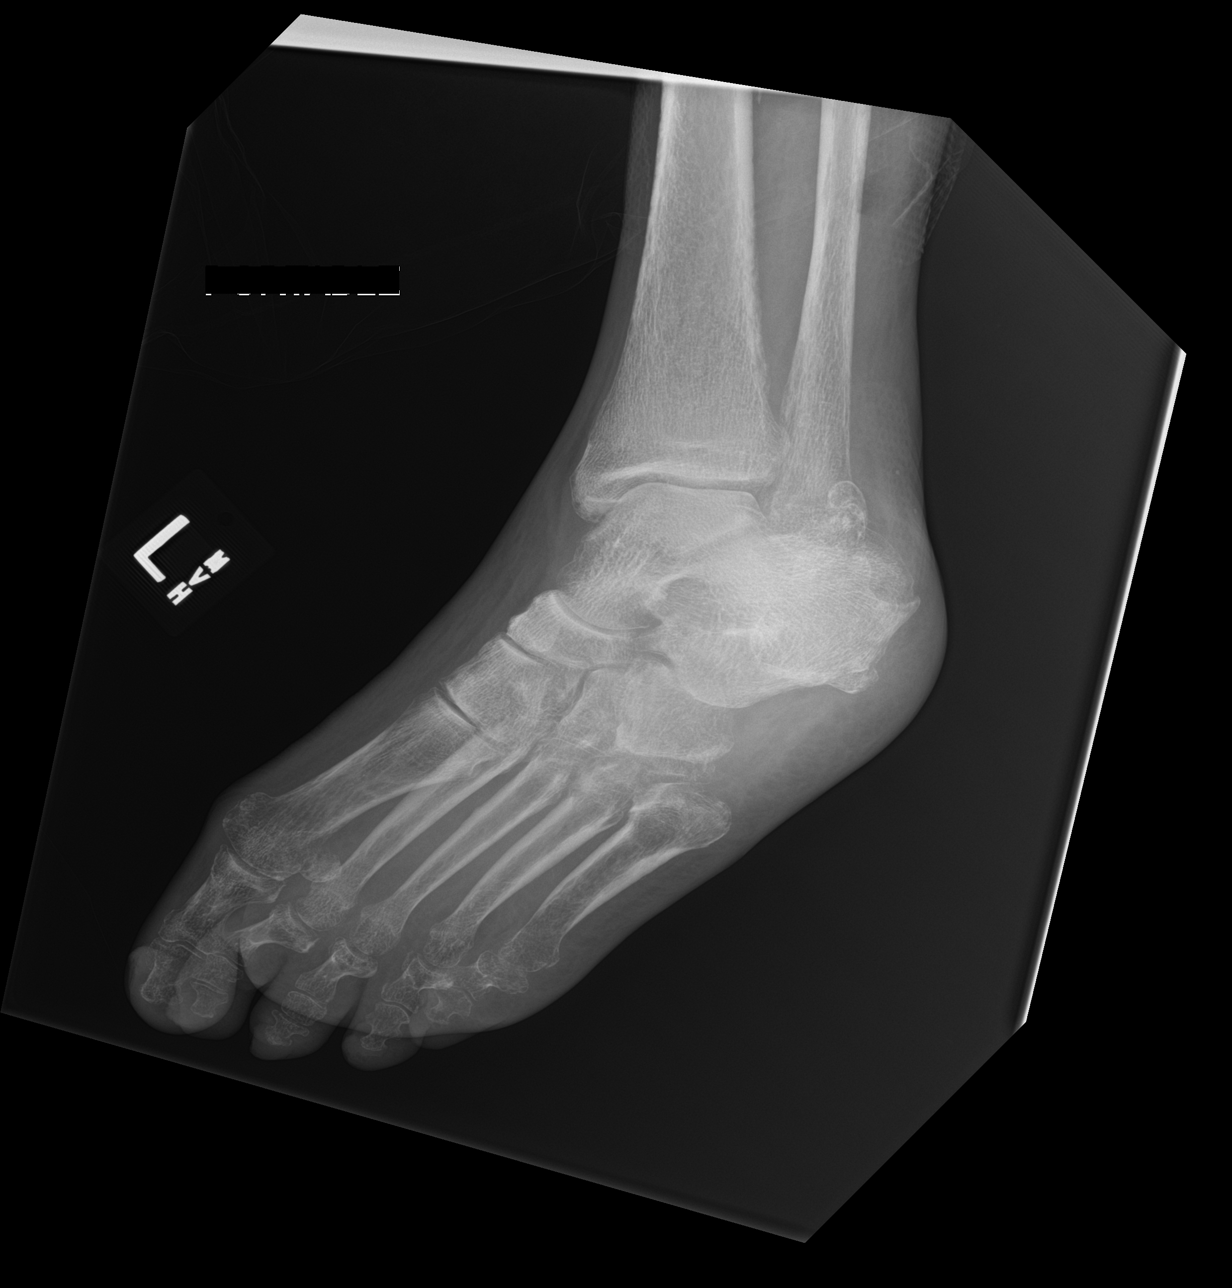

[ankle obl]
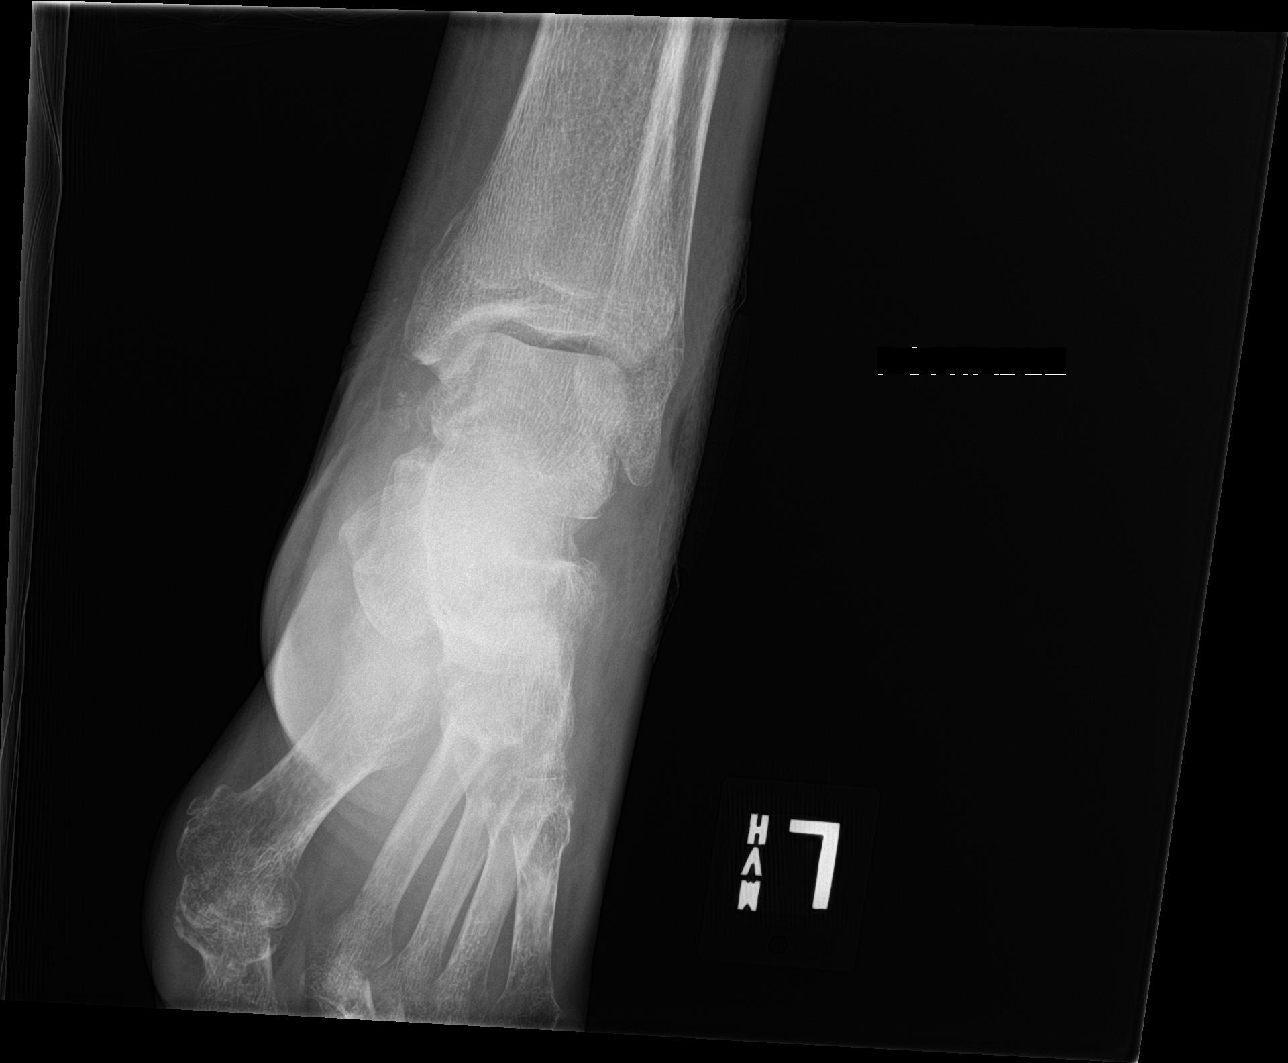

[ankle lat]
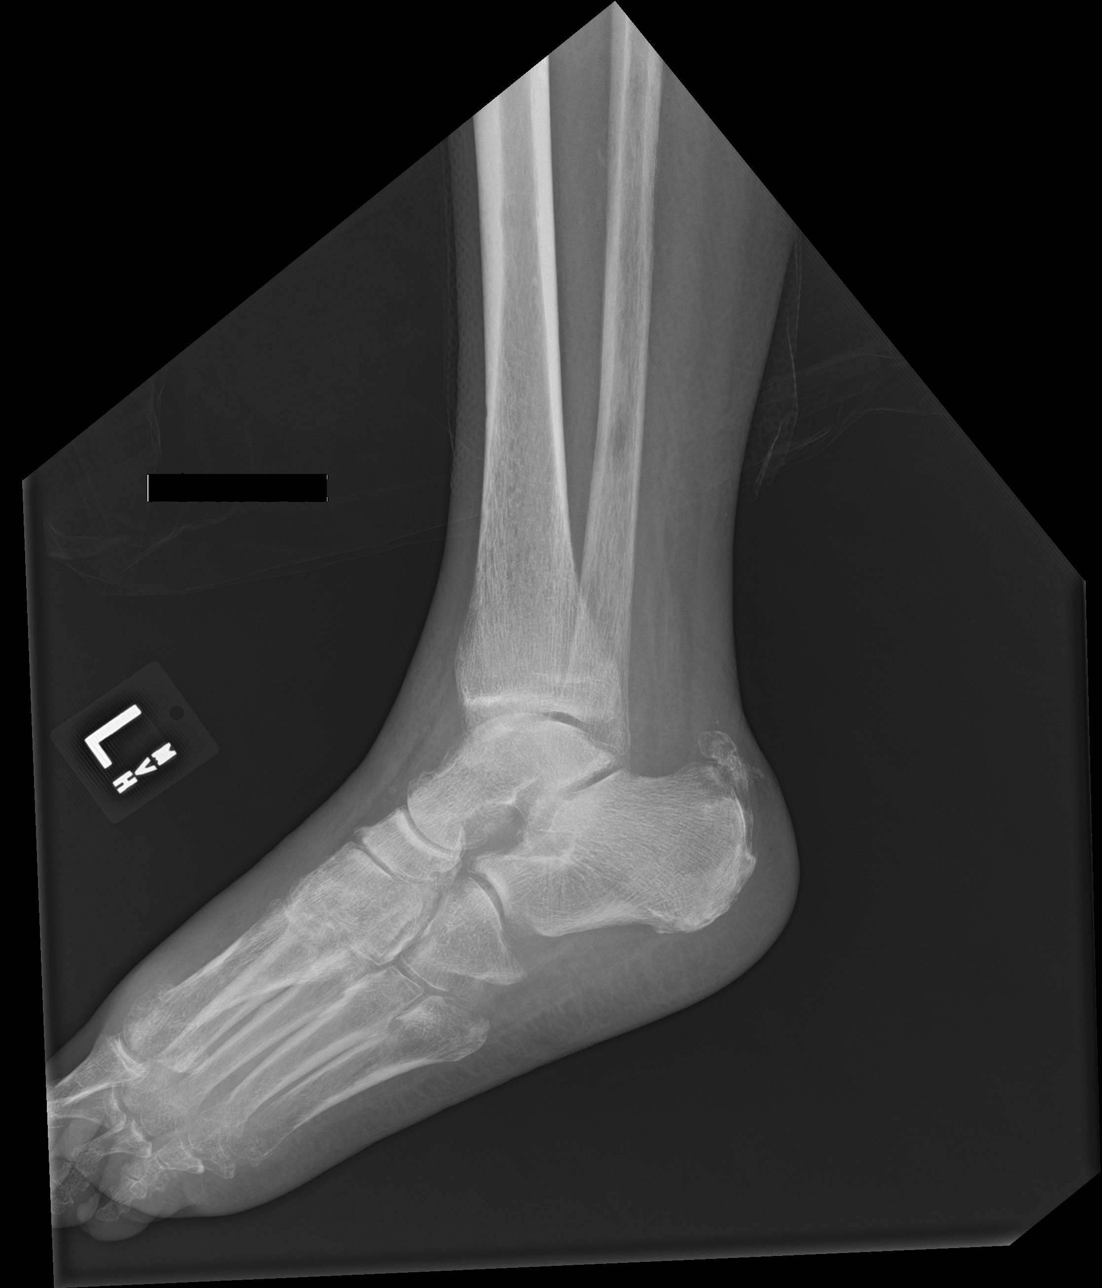

[3 of 3 positions shown; findings below may reference images not displayed]

FINDINGS: There appears to be a skin ulceration along the lateral malleolus.
Bones are osteopenic. Dorsal calcaneal spurring is seen. There is
lucency in the posterior, superior calcaneus which is unchanged
since the prior exam. Soft tissues are swollen. No soft tissue gas
or radiopaque foreign body.
IMPRESSION: Nonspecific lucency in the dorsal calcaneus could be due to
osteomyelitis. No skin ulceration is seen in this location.

Soft tissue swelling.  Negative for soft tissue gas.

Osteopenia.
